# Patient Record
Sex: Female | Born: 1970 | Race: White | Hispanic: No | Smoking: Current every day smoker
Health system: Southern US, Community
[De-identification: ages and names within clinical notes are randomized; demographics above are authoritative.]

## PROBLEM LIST (undated history)

## (undated) DIAGNOSIS — M549 Dorsalgia, unspecified: Secondary | ICD-10-CM

## (undated) DIAGNOSIS — F419 Anxiety disorder, unspecified: Secondary | ICD-10-CM

## (undated) DIAGNOSIS — K259 Gastric ulcer, unspecified as acute or chronic, without hemorrhage or perforation: Secondary | ICD-10-CM

## (undated) DIAGNOSIS — K219 Gastro-esophageal reflux disease without esophagitis: Secondary | ICD-10-CM

## (undated) DIAGNOSIS — N3010 Interstitial cystitis (chronic) without hematuria: Secondary | ICD-10-CM

## (undated) DIAGNOSIS — J449 Chronic obstructive pulmonary disease, unspecified: Secondary | ICD-10-CM

## (undated) DIAGNOSIS — N289 Disorder of kidney and ureter, unspecified: Secondary | ICD-10-CM

## (undated) DIAGNOSIS — J189 Pneumonia, unspecified organism: Secondary | ICD-10-CM

## (undated) DIAGNOSIS — F319 Bipolar disorder, unspecified: Secondary | ICD-10-CM

## (undated) DIAGNOSIS — G8929 Other chronic pain: Secondary | ICD-10-CM

## (undated) DIAGNOSIS — K2210 Ulcer of esophagus without bleeding: Secondary | ICD-10-CM

## (undated) DIAGNOSIS — E0500 Thyrotoxicosis with diffuse goiter without thyrotoxic crisis or storm: Secondary | ICD-10-CM

## (undated) DIAGNOSIS — J45909 Unspecified asthma, uncomplicated: Secondary | ICD-10-CM

## (undated) DIAGNOSIS — IMO0002 Reserved for concepts with insufficient information to code with codable children: Secondary | ICD-10-CM

## (undated) DIAGNOSIS — F32A Depression, unspecified: Secondary | ICD-10-CM

## (undated) DIAGNOSIS — E039 Hypothyroidism, unspecified: Secondary | ICD-10-CM

## (undated) DIAGNOSIS — M545 Low back pain, unspecified: Secondary | ICD-10-CM

## (undated) DIAGNOSIS — F329 Major depressive disorder, single episode, unspecified: Secondary | ICD-10-CM

## (undated) HISTORY — PX: THYROIDECTOMY: SHX17

## (undated) HISTORY — PX: FRACTURE SURGERY: SHX138

## (undated) HISTORY — PX: ABDOMINAL HYSTERECTOMY: SHX81

## (undated) HISTORY — PX: KNEE ARTHROSCOPY: SHX127

## (undated) HISTORY — PX: EYE SURGERY: SHX253

## (undated) HISTORY — PX: TOTAL ABDOMINAL HYSTERECTOMY: SHX209

## (undated) HISTORY — PX: TUBAL LIGATION: SHX77

## (undated) HISTORY — PX: KNEE ARTHROCENTESIS: SUR44

## (undated) NOTE — *Deleted (*Deleted)
Thank you for coming to see me today. It was a pleasure  Please follow-up with PCP as needed  If you have any questions or concerns, please do not hesitate to call the office at [phone].  Best,   Tanya Walsh, MD Family Medicine Residency   Outpatient Mental Health Providers (No Insurance required or Self Pay)  [facility]  [address] [place], [place] [phone] Crisis [phone]  [facility] ([place]) can see uninsured folks for outpatient therapy [email or web address]-[email or web address] [address] [place], [place] [address] [phone]  [facility]    Walk-in Mon-Fri, 8am-3pm [email or web address] [address], [place], [place]  [phone]   [address]  [place] [phone] [facility] [place] [facility] for psych med management, there may be a wait- if [facility] is working with clients for OPT, they will coordinate with [facility] for psych  [facility]   Walk-in-Clinic: Monday- Friday 9:00 AM - 4:00 PM [address]   , [place] [phone]  [facility] ([place]) walk in M-F 8am-12pm and  1pm-3pm [place]- [address]     [phone]  [place] -[address]  Phone: [phone]  [facility] (Mental Health and substance challenges) [address]   [place] [phone]    [email or web address]    [facility]  [place] -[address]     Phone:  [phone] [place]-  [address]  [phone]   [facility]  [address] [place]  [phone] [email or web address]   [facility] ( virtual or zoom therapy) [email or web address]  [address] [place]  [phone]    [facility] [phone]  grief counseling, dementia and caregiver support    [facility] Walk-in MWF 12:30 to 3:00      [address]  [place] [address]  [phone]  www.[email or web address] call to schedule an appointment    [facility]  Wellness Classes ,Support group, Peer support services, [address], [place], [place] [address] [phone]  www.mhag.org           [facility] (NAMI) [place]- Wellness classes, Support groups        [address], [place], [place] [address] [phone]   https://www.nami.org/   [facility]  (Psycho-social Rehabilitation clubhouse, Individual and group therapy) [address] [place], [place] [address]   [phone]  24- Hour Availability:  *[facility] [phone] or 1-800-711-2635 * [facility] Crisis (Domestic Violence, Rape, etc. )[phone] * [facility] 1-855-788-8787 or [phone] * [facility] [place] [facility] [phone](8am-4pm only) [phone] (after hours) *[facility] Mobile Crisis Unit 1-877-626-1772 *[place] National Suicide Hotline 1-800-273-8255 (TALK)    

---

## 1998-09-22 ENCOUNTER — Emergency Department (HOSPITAL_COMMUNITY): Admission: EM | Admit: 1998-09-22 | Discharge: 1998-09-22 | Payer: Self-pay | Admitting: Emergency Medicine

## 1998-09-22 ENCOUNTER — Encounter: Payer: Self-pay | Admitting: *Deleted

## 1998-09-26 ENCOUNTER — Emergency Department (HOSPITAL_COMMUNITY): Admission: EM | Admit: 1998-09-26 | Discharge: 1998-09-26 | Payer: Self-pay | Admitting: Emergency Medicine

## 1999-01-03 ENCOUNTER — Encounter: Admission: RE | Admit: 1999-01-03 | Discharge: 1999-01-03 | Payer: Self-pay | Admitting: Obstetrics & Gynecology

## 1999-01-04 ENCOUNTER — Encounter: Payer: Self-pay | Admitting: Family Medicine

## 1999-01-04 ENCOUNTER — Ambulatory Visit (HOSPITAL_COMMUNITY): Admission: RE | Admit: 1999-01-04 | Discharge: 1999-01-04 | Payer: Self-pay | Admitting: Family Medicine

## 1999-07-17 ENCOUNTER — Emergency Department (HOSPITAL_COMMUNITY): Admission: EM | Admit: 1999-07-17 | Discharge: 1999-07-17 | Payer: Self-pay | Admitting: Emergency Medicine

## 1999-07-17 ENCOUNTER — Encounter: Payer: Self-pay | Admitting: Emergency Medicine

## 1999-11-17 ENCOUNTER — Ambulatory Visit (HOSPITAL_COMMUNITY): Admission: RE | Admit: 1999-11-17 | Discharge: 1999-11-17 | Payer: Self-pay | Admitting: *Deleted

## 1999-12-25 ENCOUNTER — Observation Stay (HOSPITAL_COMMUNITY): Admission: AD | Admit: 1999-12-25 | Discharge: 1999-12-26 | Payer: Self-pay | Admitting: Obstetrics

## 1999-12-28 ENCOUNTER — Encounter: Admission: RE | Admit: 1999-12-28 | Discharge: 1999-12-28 | Payer: Self-pay | Admitting: Obstetrics

## 2000-01-01 ENCOUNTER — Ambulatory Visit (HOSPITAL_COMMUNITY): Admission: RE | Admit: 2000-01-01 | Discharge: 2000-01-01 | Payer: Self-pay | Admitting: *Deleted

## 2000-01-17 ENCOUNTER — Encounter: Admission: RE | Admit: 2000-01-17 | Discharge: 2000-01-17 | Payer: Self-pay | Admitting: Obstetrics & Gynecology

## 2000-01-31 ENCOUNTER — Other Ambulatory Visit: Admission: RE | Admit: 2000-01-31 | Discharge: 2000-01-31 | Payer: Self-pay | Admitting: Obstetrics & Gynecology

## 2000-01-31 ENCOUNTER — Encounter: Admission: RE | Admit: 2000-01-31 | Discharge: 2000-01-31 | Payer: Self-pay | Admitting: Obstetrics & Gynecology

## 2000-02-13 ENCOUNTER — Ambulatory Visit (HOSPITAL_COMMUNITY): Admission: RE | Admit: 2000-02-13 | Discharge: 2000-02-13 | Payer: Self-pay | Admitting: Obstetrics & Gynecology

## 2000-02-14 ENCOUNTER — Encounter: Admission: RE | Admit: 2000-02-14 | Discharge: 2000-02-14 | Payer: Self-pay | Admitting: Obstetrics & Gynecology

## 2000-03-12 ENCOUNTER — Ambulatory Visit (HOSPITAL_COMMUNITY): Admission: RE | Admit: 2000-03-12 | Discharge: 2000-03-12 | Payer: Self-pay

## 2000-03-27 ENCOUNTER — Encounter: Admission: RE | Admit: 2000-03-27 | Discharge: 2000-03-27 | Payer: Self-pay | Admitting: Obstetrics & Gynecology

## 2000-03-27 ENCOUNTER — Encounter (HOSPITAL_COMMUNITY): Admission: RE | Admit: 2000-03-27 | Discharge: 2000-04-08 | Payer: Self-pay | Admitting: Obstetrics & Gynecology

## 2000-04-03 ENCOUNTER — Encounter: Admission: RE | Admit: 2000-04-03 | Discharge: 2000-04-03 | Payer: Self-pay | Admitting: Obstetrics & Gynecology

## 2000-04-06 ENCOUNTER — Inpatient Hospital Stay (HOSPITAL_COMMUNITY): Admission: AD | Admit: 2000-04-06 | Discharge: 2000-04-08 | Payer: Self-pay | Admitting: *Deleted

## 2000-04-06 ENCOUNTER — Inpatient Hospital Stay (HOSPITAL_COMMUNITY): Admission: AD | Admit: 2000-04-06 | Discharge: 2000-04-06 | Payer: Self-pay | Admitting: *Deleted

## 2000-10-04 ENCOUNTER — Emergency Department (HOSPITAL_COMMUNITY): Admission: EM | Admit: 2000-10-04 | Discharge: 2000-10-04 | Payer: Self-pay | Admitting: Emergency Medicine

## 2000-12-06 ENCOUNTER — Encounter: Admission: RE | Admit: 2000-12-06 | Discharge: 2000-12-06 | Payer: Self-pay | Admitting: Family Medicine

## 2001-01-17 ENCOUNTER — Encounter: Admission: RE | Admit: 2001-01-17 | Discharge: 2001-01-17 | Payer: Self-pay | Admitting: Family Medicine

## 2001-01-24 ENCOUNTER — Encounter: Admission: RE | Admit: 2001-01-24 | Discharge: 2001-01-24 | Payer: Self-pay | Admitting: Family Medicine

## 2001-04-22 ENCOUNTER — Encounter: Admission: RE | Admit: 2001-04-22 | Discharge: 2001-04-22 | Payer: Self-pay | Admitting: Family Medicine

## 2001-04-23 ENCOUNTER — Encounter: Admission: RE | Admit: 2001-04-23 | Discharge: 2001-04-23 | Payer: Self-pay | Admitting: Family Medicine

## 2001-04-23 ENCOUNTER — Encounter: Payer: Self-pay | Admitting: Family Medicine

## 2001-06-13 ENCOUNTER — Encounter: Admission: RE | Admit: 2001-06-13 | Discharge: 2001-06-13 | Payer: Self-pay | Admitting: Family Medicine

## 2001-06-27 ENCOUNTER — Encounter: Admission: RE | Admit: 2001-06-27 | Discharge: 2001-06-27 | Payer: Self-pay | Admitting: Sports Medicine

## 2001-06-27 ENCOUNTER — Encounter: Payer: Self-pay | Admitting: Sports Medicine

## 2001-07-15 ENCOUNTER — Encounter: Admission: RE | Admit: 2001-07-15 | Discharge: 2001-07-15 | Payer: Self-pay | Admitting: Family Medicine

## 2001-07-22 ENCOUNTER — Encounter: Admission: RE | Admit: 2001-07-22 | Discharge: 2001-07-22 | Payer: Self-pay | Admitting: Family Medicine

## 2001-07-29 ENCOUNTER — Encounter: Payer: Self-pay | Admitting: Family Medicine

## 2001-07-29 ENCOUNTER — Ambulatory Visit (HOSPITAL_COMMUNITY): Admission: RE | Admit: 2001-07-29 | Discharge: 2001-07-29 | Payer: Self-pay | Admitting: Family Medicine

## 2001-08-01 ENCOUNTER — Encounter: Admission: RE | Admit: 2001-08-01 | Discharge: 2001-08-01 | Payer: Self-pay | Admitting: Family Medicine

## 2001-08-05 ENCOUNTER — Ambulatory Visit (HOSPITAL_COMMUNITY): Admission: RE | Admit: 2001-08-05 | Discharge: 2001-08-05 | Payer: Self-pay | Admitting: Family Medicine

## 2001-08-05 ENCOUNTER — Encounter: Payer: Self-pay | Admitting: Family Medicine

## 2001-08-26 ENCOUNTER — Encounter: Payer: Self-pay | Admitting: Family Medicine

## 2001-08-26 ENCOUNTER — Ambulatory Visit (HOSPITAL_COMMUNITY): Admission: RE | Admit: 2001-08-26 | Discharge: 2001-08-26 | Payer: Self-pay | Admitting: Family Medicine

## 2001-09-30 ENCOUNTER — Encounter: Admission: RE | Admit: 2001-09-30 | Discharge: 2001-09-30 | Payer: Self-pay | Admitting: Family Medicine

## 2001-11-18 ENCOUNTER — Encounter: Admission: RE | Admit: 2001-11-18 | Discharge: 2001-11-18 | Payer: Self-pay | Admitting: Family Medicine

## 2001-12-19 ENCOUNTER — Encounter: Admission: RE | Admit: 2001-12-19 | Discharge: 2001-12-19 | Payer: Self-pay | Admitting: Family Medicine

## 2001-12-22 ENCOUNTER — Encounter: Admission: RE | Admit: 2001-12-22 | Discharge: 2001-12-22 | Payer: Self-pay | Admitting: Family Medicine

## 2002-01-19 ENCOUNTER — Encounter: Admission: RE | Admit: 2002-01-19 | Discharge: 2002-01-19 | Payer: Self-pay | Admitting: Family Medicine

## 2002-02-25 ENCOUNTER — Encounter: Admission: RE | Admit: 2002-02-25 | Discharge: 2002-02-25 | Payer: Self-pay | Admitting: Family Medicine

## 2002-11-24 ENCOUNTER — Encounter: Admission: RE | Admit: 2002-11-24 | Discharge: 2002-11-24 | Payer: Self-pay | Admitting: Family Medicine

## 2002-12-01 ENCOUNTER — Encounter: Admission: RE | Admit: 2002-12-01 | Discharge: 2002-12-01 | Payer: Self-pay | Admitting: Family Medicine

## 2003-01-27 ENCOUNTER — Inpatient Hospital Stay (HOSPITAL_COMMUNITY): Admission: EM | Admit: 2003-01-27 | Discharge: 2003-02-01 | Payer: Self-pay | Admitting: *Deleted

## 2003-02-08 ENCOUNTER — Inpatient Hospital Stay (HOSPITAL_COMMUNITY): Admission: EM | Admit: 2003-02-08 | Discharge: 2003-02-12 | Payer: Self-pay | Admitting: Psychiatry

## 2003-02-08 ENCOUNTER — Encounter: Admission: RE | Admit: 2003-02-08 | Discharge: 2003-02-08 | Payer: Self-pay | Admitting: Family Medicine

## 2003-02-16 ENCOUNTER — Inpatient Hospital Stay (HOSPITAL_COMMUNITY): Admission: EM | Admit: 2003-02-16 | Discharge: 2003-02-20 | Payer: Self-pay | Admitting: Psychiatry

## 2003-03-15 ENCOUNTER — Encounter: Admission: RE | Admit: 2003-03-15 | Discharge: 2003-03-15 | Payer: Self-pay | Admitting: Family Medicine

## 2003-03-17 ENCOUNTER — Encounter: Payer: Self-pay | Admitting: Sports Medicine

## 2003-03-17 ENCOUNTER — Encounter: Admission: RE | Admit: 2003-03-17 | Discharge: 2003-03-17 | Payer: Self-pay | Admitting: Sports Medicine

## 2003-04-13 ENCOUNTER — Encounter: Admission: RE | Admit: 2003-04-13 | Discharge: 2003-04-13 | Payer: Self-pay | Admitting: Family Medicine

## 2003-04-13 ENCOUNTER — Other Ambulatory Visit: Admission: RE | Admit: 2003-04-13 | Discharge: 2003-04-13 | Payer: Self-pay | Admitting: Family Medicine

## 2003-04-13 ENCOUNTER — Encounter (INDEPENDENT_AMBULATORY_CARE_PROVIDER_SITE_OTHER): Payer: Self-pay | Admitting: Specialist

## 2003-04-20 ENCOUNTER — Encounter: Admission: RE | Admit: 2003-04-20 | Discharge: 2003-04-20 | Payer: Self-pay | Admitting: Family Medicine

## 2003-05-04 ENCOUNTER — Encounter: Admission: RE | Admit: 2003-05-04 | Discharge: 2003-05-04 | Payer: Self-pay | Admitting: Family Medicine

## 2003-05-07 ENCOUNTER — Encounter: Admission: RE | Admit: 2003-05-07 | Discharge: 2003-05-07 | Payer: Self-pay | Admitting: Family Medicine

## 2003-05-14 ENCOUNTER — Encounter: Admission: RE | Admit: 2003-05-14 | Discharge: 2003-05-14 | Payer: Self-pay | Admitting: Sports Medicine

## 2003-05-14 ENCOUNTER — Encounter: Payer: Self-pay | Admitting: Sports Medicine

## 2003-07-13 ENCOUNTER — Encounter: Admission: RE | Admit: 2003-07-13 | Discharge: 2003-07-13 | Payer: Self-pay | Admitting: Family Medicine

## 2003-08-03 ENCOUNTER — Encounter: Admission: RE | Admit: 2003-08-03 | Discharge: 2003-08-03 | Payer: Self-pay | Admitting: Obstetrics and Gynecology

## 2003-08-10 ENCOUNTER — Encounter: Admission: RE | Admit: 2003-08-10 | Discharge: 2003-08-10 | Payer: Self-pay | Admitting: Family Medicine

## 2003-09-06 ENCOUNTER — Ambulatory Visit (HOSPITAL_COMMUNITY): Admission: RE | Admit: 2003-09-06 | Discharge: 2003-09-06 | Payer: Self-pay | Admitting: Obstetrics & Gynecology

## 2003-09-15 ENCOUNTER — Inpatient Hospital Stay (HOSPITAL_COMMUNITY): Admission: AD | Admit: 2003-09-15 | Discharge: 2003-09-15 | Payer: Self-pay | Admitting: Obstetrics and Gynecology

## 2003-10-19 ENCOUNTER — Encounter: Admission: RE | Admit: 2003-10-19 | Discharge: 2003-10-19 | Payer: Self-pay | Admitting: Obstetrics & Gynecology

## 2003-10-21 ENCOUNTER — Emergency Department (HOSPITAL_COMMUNITY): Admission: EM | Admit: 2003-10-21 | Discharge: 2003-10-21 | Payer: Self-pay | Admitting: Emergency Medicine

## 2003-11-19 ENCOUNTER — Encounter: Admission: RE | Admit: 2003-11-19 | Discharge: 2003-11-19 | Payer: Self-pay | Admitting: Family Medicine

## 2004-01-03 ENCOUNTER — Encounter: Admission: RE | Admit: 2004-01-03 | Discharge: 2004-01-03 | Payer: Self-pay | Admitting: Family Medicine

## 2004-01-15 ENCOUNTER — Encounter (INDEPENDENT_AMBULATORY_CARE_PROVIDER_SITE_OTHER): Payer: Self-pay | Admitting: *Deleted

## 2004-01-15 LAB — CONVERTED CEMR LAB

## 2004-01-17 ENCOUNTER — Encounter: Admission: RE | Admit: 2004-01-17 | Discharge: 2004-01-17 | Payer: Self-pay | Admitting: Family Medicine

## 2004-02-02 ENCOUNTER — Encounter: Admission: RE | Admit: 2004-02-02 | Discharge: 2004-02-02 | Payer: Self-pay | Admitting: Family Medicine

## 2004-02-15 ENCOUNTER — Encounter: Admission: RE | Admit: 2004-02-15 | Discharge: 2004-02-15 | Payer: Self-pay | Admitting: Family Medicine

## 2004-02-29 ENCOUNTER — Encounter: Admission: RE | Admit: 2004-02-29 | Discharge: 2004-02-29 | Payer: Self-pay | Admitting: Family Medicine

## 2004-03-28 ENCOUNTER — Encounter: Admission: RE | Admit: 2004-03-28 | Discharge: 2004-03-28 | Payer: Self-pay | Admitting: Family Medicine

## 2004-05-02 ENCOUNTER — Encounter: Admission: RE | Admit: 2004-05-02 | Discharge: 2004-05-02 | Payer: Self-pay | Admitting: Family Medicine

## 2004-05-15 ENCOUNTER — Emergency Department (HOSPITAL_COMMUNITY): Admission: EM | Admit: 2004-05-15 | Discharge: 2004-05-15 | Payer: Self-pay | Admitting: Family Medicine

## 2004-05-17 ENCOUNTER — Encounter: Admission: RE | Admit: 2004-05-17 | Discharge: 2004-05-17 | Payer: Self-pay | Admitting: Family Medicine

## 2004-06-28 ENCOUNTER — Encounter: Admission: RE | Admit: 2004-06-28 | Discharge: 2004-06-28 | Payer: Self-pay | Admitting: Family Medicine

## 2004-07-11 ENCOUNTER — Encounter: Admission: RE | Admit: 2004-07-11 | Discharge: 2004-07-11 | Payer: Self-pay | Admitting: Family Medicine

## 2004-10-10 ENCOUNTER — Ambulatory Visit: Payer: Self-pay | Admitting: Family Medicine

## 2004-11-14 ENCOUNTER — Ambulatory Visit: Payer: Self-pay | Admitting: Family Medicine

## 2004-11-28 ENCOUNTER — Ambulatory Visit: Payer: Self-pay | Admitting: Obstetrics & Gynecology

## 2004-12-06 ENCOUNTER — Ambulatory Visit (HOSPITAL_BASED_OUTPATIENT_CLINIC_OR_DEPARTMENT_OTHER): Admission: RE | Admit: 2004-12-06 | Discharge: 2004-12-06 | Payer: Self-pay | Admitting: Urology

## 2004-12-06 ENCOUNTER — Encounter (INDEPENDENT_AMBULATORY_CARE_PROVIDER_SITE_OTHER): Payer: Self-pay | Admitting: *Deleted

## 2004-12-06 ENCOUNTER — Ambulatory Visit (HOSPITAL_COMMUNITY): Admission: RE | Admit: 2004-12-06 | Discharge: 2004-12-06 | Payer: Self-pay | Admitting: Urology

## 2005-03-08 ENCOUNTER — Emergency Department (HOSPITAL_COMMUNITY): Admission: EM | Admit: 2005-03-08 | Discharge: 2005-03-08 | Payer: Self-pay | Admitting: Internal Medicine

## 2005-04-03 ENCOUNTER — Ambulatory Visit: Payer: Self-pay | Admitting: Family Medicine

## 2005-05-01 ENCOUNTER — Ambulatory Visit: Payer: Self-pay | Admitting: Family Medicine

## 2005-08-06 ENCOUNTER — Ambulatory Visit: Payer: Self-pay | Admitting: Family Medicine

## 2005-11-01 ENCOUNTER — Ambulatory Visit: Payer: Self-pay | Admitting: Family Medicine

## 2005-12-25 ENCOUNTER — Ambulatory Visit: Payer: Self-pay | Admitting: Sports Medicine

## 2006-03-21 ENCOUNTER — Ambulatory Visit: Payer: Self-pay | Admitting: Family Medicine

## 2006-06-13 ENCOUNTER — Ambulatory Visit: Payer: Self-pay | Admitting: Family Medicine

## 2006-09-23 ENCOUNTER — Ambulatory Visit: Payer: Self-pay | Admitting: Family Medicine

## 2006-12-23 ENCOUNTER — Ambulatory Visit: Payer: Self-pay | Admitting: Family Medicine

## 2007-01-09 DIAGNOSIS — E0500 Thyrotoxicosis with diffuse goiter without thyrotoxic crisis or storm: Secondary | ICD-10-CM | POA: Insufficient documentation

## 2007-01-09 DIAGNOSIS — F17200 Nicotine dependence, unspecified, uncomplicated: Secondary | ICD-10-CM | POA: Insufficient documentation

## 2007-01-09 DIAGNOSIS — F319 Bipolar disorder, unspecified: Secondary | ICD-10-CM

## 2007-01-09 DIAGNOSIS — N3010 Interstitial cystitis (chronic) without hematuria: Secondary | ICD-10-CM | POA: Insufficient documentation

## 2007-01-09 DIAGNOSIS — E039 Hypothyroidism, unspecified: Secondary | ICD-10-CM | POA: Insufficient documentation

## 2007-01-09 DIAGNOSIS — F17210 Nicotine dependence, cigarettes, uncomplicated: Secondary | ICD-10-CM | POA: Insufficient documentation

## 2007-01-09 DIAGNOSIS — F411 Generalized anxiety disorder: Secondary | ICD-10-CM | POA: Insufficient documentation

## 2007-01-10 ENCOUNTER — Encounter (INDEPENDENT_AMBULATORY_CARE_PROVIDER_SITE_OTHER): Payer: Self-pay | Admitting: *Deleted

## 2007-01-30 ENCOUNTER — Ambulatory Visit: Payer: Self-pay | Admitting: Gynecology

## 2007-01-30 ENCOUNTER — Encounter (INDEPENDENT_AMBULATORY_CARE_PROVIDER_SITE_OTHER): Payer: Self-pay | Admitting: Gynecology

## 2007-03-06 ENCOUNTER — Inpatient Hospital Stay (HOSPITAL_COMMUNITY): Admission: RE | Admit: 2007-03-06 | Discharge: 2007-03-08 | Payer: Self-pay | Admitting: Gynecology

## 2007-03-06 ENCOUNTER — Encounter (INDEPENDENT_AMBULATORY_CARE_PROVIDER_SITE_OTHER): Payer: Self-pay | Admitting: Specialist

## 2007-03-06 ENCOUNTER — Ambulatory Visit: Payer: Self-pay | Admitting: Gynecology

## 2007-04-23 ENCOUNTER — Telehealth: Payer: Self-pay | Admitting: Family Medicine

## 2007-05-23 ENCOUNTER — Telehealth: Payer: Self-pay | Admitting: Family Medicine

## 2007-06-20 ENCOUNTER — Ambulatory Visit: Payer: Self-pay | Admitting: Family Medicine

## 2007-06-20 ENCOUNTER — Encounter: Payer: Self-pay | Admitting: Family Medicine

## 2007-06-23 ENCOUNTER — Telehealth: Payer: Self-pay | Admitting: Family Medicine

## 2007-06-23 LAB — CONVERTED CEMR LAB: TSH: 0.252 microintl units/mL — ABNORMAL LOW (ref 0.350–5.50)

## 2007-07-21 ENCOUNTER — Telehealth: Payer: Self-pay | Admitting: Family Medicine

## 2007-08-15 ENCOUNTER — Telehealth: Payer: Self-pay | Admitting: Family Medicine

## 2007-09-01 ENCOUNTER — Telehealth: Payer: Self-pay | Admitting: Family Medicine

## 2007-09-19 ENCOUNTER — Telehealth (INDEPENDENT_AMBULATORY_CARE_PROVIDER_SITE_OTHER): Payer: Self-pay | Admitting: Family Medicine

## 2007-10-13 ENCOUNTER — Ambulatory Visit: Payer: Self-pay | Admitting: Sports Medicine

## 2007-10-13 DIAGNOSIS — Z780 Asymptomatic menopausal state: Secondary | ICD-10-CM | POA: Insufficient documentation

## 2007-10-20 ENCOUNTER — Telehealth: Payer: Self-pay | Admitting: Family Medicine

## 2007-11-11 ENCOUNTER — Telehealth (INDEPENDENT_AMBULATORY_CARE_PROVIDER_SITE_OTHER): Payer: Self-pay | Admitting: Family Medicine

## 2007-12-12 ENCOUNTER — Telehealth: Payer: Self-pay | Admitting: Family Medicine

## 2008-01-14 ENCOUNTER — Ambulatory Visit: Payer: Self-pay | Admitting: Family Medicine

## 2008-01-14 DIAGNOSIS — J449 Chronic obstructive pulmonary disease, unspecified: Secondary | ICD-10-CM

## 2008-01-15 ENCOUNTER — Ambulatory Visit: Payer: Self-pay | Admitting: Family Medicine

## 2008-01-26 ENCOUNTER — Telehealth: Payer: Self-pay | Admitting: *Deleted

## 2008-01-27 ENCOUNTER — Emergency Department (HOSPITAL_COMMUNITY): Admission: EM | Admit: 2008-01-27 | Discharge: 2008-01-27 | Payer: Self-pay | Admitting: Emergency Medicine

## 2008-02-11 ENCOUNTER — Telehealth: Payer: Self-pay | Admitting: Family Medicine

## 2008-03-12 ENCOUNTER — Telehealth: Payer: Self-pay | Admitting: Family Medicine

## 2008-03-18 ENCOUNTER — Encounter: Payer: Self-pay | Admitting: Family Medicine

## 2008-03-23 ENCOUNTER — Ambulatory Visit: Payer: Self-pay | Admitting: Family Medicine

## 2008-04-02 ENCOUNTER — Encounter: Payer: Self-pay | Admitting: Family Medicine

## 2008-04-09 ENCOUNTER — Telehealth: Payer: Self-pay | Admitting: Family Medicine

## 2008-05-12 ENCOUNTER — Telehealth: Payer: Self-pay | Admitting: Family Medicine

## 2008-05-17 ENCOUNTER — Ambulatory Visit: Payer: Self-pay | Admitting: Family Medicine

## 2008-05-17 ENCOUNTER — Telehealth: Payer: Self-pay | Admitting: *Deleted

## 2008-06-07 ENCOUNTER — Encounter: Payer: Self-pay | Admitting: Family Medicine

## 2008-06-11 ENCOUNTER — Telehealth: Payer: Self-pay | Admitting: Family Medicine

## 2008-07-12 ENCOUNTER — Encounter: Payer: Self-pay | Admitting: Family Medicine

## 2008-07-13 ENCOUNTER — Telehealth: Payer: Self-pay | Admitting: Family Medicine

## 2008-08-12 ENCOUNTER — Telehealth: Payer: Self-pay | Admitting: *Deleted

## 2008-09-10 ENCOUNTER — Telehealth: Payer: Self-pay | Admitting: Family Medicine

## 2008-10-12 ENCOUNTER — Telehealth: Payer: Self-pay | Admitting: Family Medicine

## 2008-11-15 ENCOUNTER — Telehealth: Payer: Self-pay | Admitting: Family Medicine

## 2008-12-10 ENCOUNTER — Encounter: Payer: Self-pay | Admitting: Family Medicine

## 2008-12-10 ENCOUNTER — Ambulatory Visit: Payer: Self-pay | Admitting: Family Medicine

## 2008-12-10 LAB — CONVERTED CEMR LAB
ALT: 9 units/L (ref 0–35)
AST: 12 units/L (ref 0–37)
Albumin: 4.5 g/dL (ref 3.5–5.2)
Alkaline Phosphatase: 69 units/L (ref 39–117)
BUN: 10 mg/dL (ref 6–23)
Basophils Absolute: 0.1 10*3/uL (ref 0.0–0.1)
Basophils Relative: 1 % (ref 0–1)
CO2: 24 meq/L (ref 19–32)
Calcium: 9.6 mg/dL (ref 8.4–10.5)
Chloride: 106 meq/L (ref 96–112)
Cholesterol: 165 mg/dL (ref 0–200)
Creatinine, Ser: 0.86 mg/dL (ref 0.40–1.20)
Eosinophils Absolute: 0.1 10*3/uL (ref 0.0–0.7)
Eosinophils Relative: 2 % (ref 0–5)
Glucose, Bld: 92 mg/dL (ref 70–99)
HCT: 43.2 % (ref 36.0–46.0)
HDL: 53 mg/dL (ref >39)
Hemoglobin: 14.3 g/dL (ref 12.0–15.0)
LDL Cholesterol: 91 mg/dL (ref 0–99)
Lymphocytes Relative: 44 % (ref 12–46)
Lymphs Abs: 2 10*3/uL (ref 0.7–4.0)
MCHC: 33.1 g/dL (ref 30.0–36.0)
MCV: 90.2 fL (ref 78.0–100.0)
Monocytes Absolute: 0.4 10*3/uL (ref 0.1–1.0)
Monocytes Relative: 8 % (ref 3–12)
Neutro Abs: 2.1 10*3/uL (ref 1.7–7.7)
Neutrophils Relative %: 46 % (ref 43–77)
Platelets: 277 10*3/uL (ref 150–400)
Potassium: 4.3 meq/L (ref 3.5–5.3)
RBC: 4.79 M/uL (ref 3.87–5.11)
RDW: 12.5 % (ref 11.5–15.5)
Sodium: 140 meq/L (ref 135–145)
TSH: 0.034 microintl units/mL — ABNORMAL LOW (ref 0.350–4.50)
Total Bilirubin: 0.4 mg/dL (ref 0.3–1.2)
Total CHOL/HDL Ratio: 3.1
Total Protein: 7 g/dL (ref 6.0–8.3)
Triglycerides: 105 mg/dL (ref <150)
VLDL: 21 mg/dL (ref 0–40)
Vit D, 1,25-Dihydroxy: 26 — ABNORMAL LOW (ref 30–89)
WBC: 4.6 10*3/uL (ref 4.0–10.5)

## 2008-12-13 ENCOUNTER — Encounter: Payer: Self-pay | Admitting: Family Medicine

## 2008-12-21 ENCOUNTER — Telehealth: Payer: Self-pay | Admitting: Family Medicine

## 2009-01-10 ENCOUNTER — Telehealth: Payer: Self-pay | Admitting: Family Medicine

## 2009-01-11 ENCOUNTER — Ambulatory Visit: Payer: Self-pay | Admitting: Family Medicine

## 2009-01-13 ENCOUNTER — Telehealth: Payer: Self-pay | Admitting: Family Medicine

## 2009-01-13 ENCOUNTER — Encounter: Admission: RE | Admit: 2009-01-13 | Discharge: 2009-01-13 | Payer: Self-pay | Admitting: Family Medicine

## 2009-01-13 ENCOUNTER — Encounter: Payer: Self-pay | Admitting: Family Medicine

## 2009-02-10 ENCOUNTER — Telehealth: Payer: Self-pay | Admitting: Family Medicine

## 2009-02-17 ENCOUNTER — Telehealth: Payer: Self-pay | Admitting: Family Medicine

## 2009-02-17 ENCOUNTER — Encounter: Payer: Self-pay | Admitting: Family Medicine

## 2009-03-08 ENCOUNTER — Encounter: Payer: Self-pay | Admitting: Family Medicine

## 2009-04-12 ENCOUNTER — Encounter: Payer: Self-pay | Admitting: Family Medicine

## 2009-05-11 ENCOUNTER — Telehealth: Payer: Self-pay | Admitting: Family Medicine

## 2009-06-06 ENCOUNTER — Encounter: Payer: Self-pay | Admitting: *Deleted

## 2009-06-09 ENCOUNTER — Telehealth: Payer: Self-pay | Admitting: Family Medicine

## 2009-06-10 ENCOUNTER — Telehealth: Payer: Self-pay | Admitting: *Deleted

## 2009-07-12 ENCOUNTER — Encounter: Payer: Self-pay | Admitting: Family Medicine

## 2009-07-13 ENCOUNTER — Telehealth: Payer: Self-pay | Admitting: Family Medicine

## 2009-07-25 ENCOUNTER — Encounter: Payer: Self-pay | Admitting: Family Medicine

## 2009-08-12 ENCOUNTER — Telehealth: Payer: Self-pay | Admitting: Family Medicine

## 2009-09-12 ENCOUNTER — Telehealth: Payer: Self-pay | Admitting: Family Medicine

## 2009-09-26 ENCOUNTER — Ambulatory Visit: Payer: Self-pay | Admitting: Family Medicine

## 2009-09-26 ENCOUNTER — Encounter: Payer: Self-pay | Admitting: Family Medicine

## 2009-10-11 ENCOUNTER — Telehealth: Payer: Self-pay | Admitting: Family Medicine

## 2009-11-09 ENCOUNTER — Telehealth: Payer: Self-pay | Admitting: Family Medicine

## 2009-12-12 ENCOUNTER — Telehealth: Payer: Self-pay | Admitting: Family Medicine

## 2009-12-14 ENCOUNTER — Telehealth: Payer: Self-pay | Admitting: Family Medicine

## 2010-01-09 ENCOUNTER — Telehealth: Payer: Self-pay | Admitting: Family Medicine

## 2010-02-09 ENCOUNTER — Telehealth: Payer: Self-pay | Admitting: Family Medicine

## 2010-03-10 ENCOUNTER — Telehealth: Payer: Self-pay | Admitting: Family Medicine

## 2010-04-11 ENCOUNTER — Telehealth: Payer: Self-pay | Admitting: Family Medicine

## 2010-05-08 ENCOUNTER — Emergency Department (HOSPITAL_COMMUNITY): Admission: EM | Admit: 2010-05-08 | Discharge: 2010-05-08 | Payer: Self-pay | Admitting: Emergency Medicine

## 2010-05-09 ENCOUNTER — Telehealth: Payer: Self-pay | Admitting: Family Medicine

## 2010-05-09 ENCOUNTER — Ambulatory Visit: Payer: Self-pay | Admitting: Family Medicine

## 2010-05-09 ENCOUNTER — Inpatient Hospital Stay (HOSPITAL_COMMUNITY): Admission: AD | Admit: 2010-05-09 | Discharge: 2010-05-11 | Payer: Self-pay | Admitting: Family Medicine

## 2010-05-10 ENCOUNTER — Telehealth: Payer: Self-pay | Admitting: Family Medicine

## 2010-05-10 ENCOUNTER — Encounter: Payer: Self-pay | Admitting: Family Medicine

## 2010-05-23 ENCOUNTER — Encounter: Payer: Self-pay | Admitting: Family Medicine

## 2010-05-23 ENCOUNTER — Ambulatory Visit: Payer: Self-pay | Admitting: Family Medicine

## 2010-05-23 DIAGNOSIS — K279 Peptic ulcer, site unspecified, unspecified as acute or chronic, without hemorrhage or perforation: Secondary | ICD-10-CM | POA: Insufficient documentation

## 2010-05-23 LAB — CONVERTED CEMR LAB: TSH: 0.91 microintl units/mL (ref 0.350–4.500)

## 2010-05-24 ENCOUNTER — Encounter: Payer: Self-pay | Admitting: Family Medicine

## 2010-06-08 ENCOUNTER — Telehealth: Payer: Self-pay | Admitting: Family Medicine

## 2010-06-08 ENCOUNTER — Encounter: Payer: Self-pay | Admitting: Family Medicine

## 2010-07-12 ENCOUNTER — Telehealth: Payer: Self-pay | Admitting: Family Medicine

## 2010-07-31 ENCOUNTER — Encounter: Payer: Self-pay | Admitting: Family Medicine

## 2010-07-31 DIAGNOSIS — J45909 Unspecified asthma, uncomplicated: Secondary | ICD-10-CM

## 2010-08-09 ENCOUNTER — Encounter: Payer: Self-pay | Admitting: Family Medicine

## 2010-08-10 ENCOUNTER — Telehealth: Payer: Self-pay | Admitting: Family Medicine

## 2010-08-21 ENCOUNTER — Emergency Department (HOSPITAL_COMMUNITY): Admission: EM | Admit: 2010-08-21 | Discharge: 2010-08-21 | Payer: Self-pay | Admitting: Family Medicine

## 2010-08-21 ENCOUNTER — Telehealth: Payer: Self-pay | Admitting: Family Medicine

## 2010-09-08 ENCOUNTER — Encounter: Payer: Self-pay | Admitting: Family Medicine

## 2010-09-11 ENCOUNTER — Telehealth: Payer: Self-pay | Admitting: Family Medicine

## 2010-09-21 ENCOUNTER — Encounter: Payer: Self-pay | Admitting: Family Medicine

## 2010-10-10 ENCOUNTER — Ambulatory Visit (HOSPITAL_BASED_OUTPATIENT_CLINIC_OR_DEPARTMENT_OTHER): Admission: RE | Admit: 2010-10-10 | Discharge: 2010-10-10 | Payer: Self-pay | Admitting: Urology

## 2010-10-11 ENCOUNTER — Telehealth: Payer: Self-pay | Admitting: Family Medicine

## 2010-10-18 ENCOUNTER — Ambulatory Visit: Payer: Self-pay | Admitting: Family Medicine

## 2010-10-24 ENCOUNTER — Encounter: Payer: Self-pay | Admitting: Family Medicine

## 2010-10-24 LAB — CONVERTED CEMR LAB
Cholesterol: 187 mg/dL (ref 0–200)
HDL: 44 mg/dL (ref >39)
LDL Cholesterol: 121 mg/dL — ABNORMAL HIGH (ref 0–99)
Total CHOL/HDL Ratio: 4.3
Triglycerides: 109 mg/dL (ref <150)
VLDL: 22 mg/dL (ref 0–40)

## 2010-12-03 ENCOUNTER — Encounter: Payer: Self-pay | Admitting: Family Medicine

## 2010-12-12 ENCOUNTER — Telehealth: Payer: Self-pay | Admitting: Family Medicine

## 2010-12-12 NOTE — Progress Notes (Signed)
  Phone Note Call from Patient   Caller: Patient Summary of Call: Need rx for her Clonzepam.  Pt is going out of town this weekend would like it filled before 10:00 tomorrow.  You can reach her at [phone].  [facility] is her pharmacy. Initial call taken by: Britta Mccreedy mcgregor  Follow-up for Phone Call        called in myself, she can pick up there Follow-up by: Luretha Murphy NP,  July 12, 2010 12:13 PM    Prescriptions: CLONAZEPAM 1 MG TABS (CLONAZEPAM) Take 1 tablet by mouth three times a day  #90 x 0   Entered and Authorized by:   Luretha Murphy NP   Signed by:   Luretha Murphy NP on 07/12/2010   Method used:   Handwritten   RxID:   [id number]

## 2010-12-12 NOTE — Progress Notes (Signed)
Summary: refill  Phone Note Refill Request Call back at Home Phone [phone] Message from:  Patient  Refills Requested: Medication #1:  CLONAZEPAM 1 MG TABS Take 1 tablet by mouth three times a day   Notes: needs by aug 1st Initial call taken by: De Nurse,  June 08, 2010 3:55 PM    Prescriptions: CLONAZEPAM 1 MG TABS (CLONAZEPAM) Take 1 tablet by mouth three times a day  #90 x 0   Entered and Authorized by:   Luretha Murphy NP   Signed by:   Luretha Murphy NP on 06/09/2010   Method used:   Printed then faxed to ...       [facility]* (retail)       [address] [place], [place]  [address]       Ph: [phone] or [phone]       Fax: [phone]   RxID:   [id number]

## 2010-12-12 NOTE — Letter (Signed)
Summary: Generic Letter  [facility]  [address]   [place], [place] [address]   Phone: [phone]  Fax: [phone]    05/24/2010  [facility] [address] [place], [place]  [address]  Dear Ms. Saric,   Your TSH was normal.    Please plan to quit smoking and we will help you.  Dr. Raymondo Band, a pharmacist at our clinic works with people to help them quit.  You can make an appointment with him when you are ready.  I would also like him to test your lung function.  That may help you decide how important this step is in you being healthy.  Please make an appoinment with Dr. Raymondo Band for pulmonary function tests and smoking cessation when you are ready.  I would like to see you in 2 months.    Sincerely,   Luretha Murphy NP  Appended Document: Generic Letter mailed

## 2010-12-12 NOTE — Progress Notes (Signed)
Summary: refill  Phone Note Refill Request Call back at Home Phone [phone] Message from:  Patient  Refills Requested: Medication #1:  CLONAZEPAM 1 MG TABS Take 1 tablet by mouth three times a day Please call when ready  Initial call taken by: De Nurse,  September 11, 2010 4:19 PM    New/Updated Medications: CLONAZEPAM 1 MG TABS (CLONAZEPAM) Take 1 tablet by mouth three times a day (patient needs to be seen for further refills) Prescriptions: CLONAZEPAM 1 MG TABS (CLONAZEPAM) Take 1 tablet by mouth three times a day (patient needs to be seen for further refills)  #90 x 0   Entered and Authorized by:   Luretha Murphy NP   Signed by:   Luretha Murphy NP on 09/11/2010   Method used:   Printed then faxed to ...       [facility]* (retail)       [address] [place], [place]  [address]       Ph: [phone] or [phone]       Fax: [phone]   RxID:   [id number] CLONAZEPAM 1 MG TABS (CLONAZEPAM) Take 1 tablet by mouth three times a day (patient needs to be seen for further refills)  #90 x 0   Entered and Authorized by:   Luretha Murphy NP   Signed by:   Luretha Murphy NP on 09/11/2010   Method used:   Printed then faxed to ...       [facility]* (retail)       [address] [place], [place]  [address]       Ph: [phone] or [phone]       Fax: [phone]   RxID:   [id number]

## 2010-12-12 NOTE — Progress Notes (Signed)
  Phone Note Call from Patient   Caller: Patient Call For: Luretha Murphy NP Summary of Call: Pt need refill on Clonazapam.  Please call [facility].  Let her know when this has been called in.  [phone] Initial call taken by: Britta Mccreedy mcgregor  Follow-up for Phone Call        to pcp Follow-up by: Theresia Lo RN,  February 09, 2010 11:01 AM    New/Updated Medications: CLONAZEPAM 1 MG TABS (CLONAZEPAM) Take 1 tablet by mouth three times a day Prescriptions: CLONAZEPAM 1 MG TABS (CLONAZEPAM) Take 1 tablet by mouth three times a day  #90 x 0   Entered and Authorized by:   Luretha Murphy NP   Signed by:   Luretha Murphy NP on 02/09/2010   Method used:   Historical   RxID:   [id number]  Called in to the pharmacy Luretha Murphy NP  February 09, 2010 1:37 PM

## 2010-12-12 NOTE — Progress Notes (Signed)
Summary: Rx Req  Phone Note Refill Request Call back at Home Phone [phone] Message from:  Patient  Refills Requested: Medication #1:  CLONAZEPAM 1 MG TABS Take 1 tablet by mouth three times a day [facility].  Initial call taken by: Clydell Hakim,  December 12, 2009 9:00 AM  Follow-up for Phone Call        to pcp Follow-up by: Golden Circle RN,  December 12, 2009 9:02 AM    Prescriptions: CLONAZEPAM 1 MG TABS (CLONAZEPAM) Take 1 tablet by mouth three times a day  #90 x 0   Entered and Authorized by:   Luretha Murphy NP   Signed by:   Luretha Murphy NP on 12/12/2009   Method used:   Printed then faxed to ...       [facility]* (retail)       [address] [place], [place]  [address]       Ph: [phone] or [phone]       Fax: [phone]   RxID:   [id number]

## 2010-12-12 NOTE — Consult Note (Signed)
Summary: [facility]   Imported By: Clydell Hakim 06/19/2010 15:39:29  _____________________________________________________________________  External Attachment:    Type:   Image     Comment:   External Document

## 2010-12-12 NOTE — Progress Notes (Signed)
Summary: triage  Phone Note Call from Patient Call back at Home Phone [phone]   Caller: Patient Summary of Call: Pt seen in ed yesterday for hurting in chest and throwing up.  Says she needs to be seen by someone today.  The ed says she might have an ulcer. Initial call taken by: Clydell Hakim,  May 09, 2010 11:18 AM  Follow-up for Phone Call        states she feels just as bad as she felt yesterday when she called an ambulance. will be here at 1:30. aware of wait Follow-up by: Golden Circle RN,  May 09, 2010 11:27 AM

## 2010-12-12 NOTE — Assessment & Plan Note (Signed)
Summary: F/U ED visit-HOSPITAL ADMISSION   Vital Signs:  Patient profile:   40 year old female Weight:      149.1 pounds Temp:     98.3 degrees F oral Pulse rate:   63 / minute BP sitting:   150 / 90  (right arm)  Vitals Entered By: Arlyss Repress CMA, (May 09, 2010 1:57 PM) CC: stomach pain.Melinda Kitchenleft sided. seen at ED yesterday. vomitting, nausea. esophagus is burning. Pain Assessment Patient in pain? yes     Location: stomach Intensity: 10 Onset of pain  x2 days.   Primary Care Provider:  Luretha Murphy NP  CC:  stomach pain.Melinda Kitchenleft sided. seen at ED yesterday. vomitting and nausea. esophagus is burning.[name]  History of Present Illness:   40 y.o. female presents as follow-up from ED visit for abdominal pain. Abdominal pain began Monday approx 3am waking pt from sleep, pain described as crampy sharp pain in epigastrium that radiates to LUQ. Pain assocaited with Nausea and Emesis of yellow green fluid. Denies assoicated fever, change in stools, blood in stools. 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 EKG showed NSR with non specific t wave changes. Patient was treated with IVF, anti-emtics, GI cocktail and pain meds. Per report trial of by mouth was done and pt discharged with gastritis. This AM pt presented to [facility] with severe pain and  recurrent emesis per above, emesis was visualized with blood speckles. Pt was given Phenergan 25mg  IM and Morphine 5mg  IM and sent for admission for IV hydration and work-up.   ROS- pt denied chest pain, SOB, diarrhea, constipation, dysuria, admits to anorexia since symptoms began, denies alcohol or new medication ingestion.   Habits & Providers  Alcohol-Tobacco-Diet     Tobacco Status: current     Tobacco Counseling: to quit use  of tobacco products  Current Medications (verified): 1)  Ventolin Hfa 108 (90 Base) Mcg/act Aers (Albuterol Sulfate) .... 2 Puffs Qid As Needed 2)  Clonazepam 1 Mg Tabs (Clonazepam) .... Take 1 Tablet By Mouth Three Times A Day 3)  Synthroid 150 Mcg Tabs (Levothyroxine Sodium) .... One Daily 4)  Trazodone Hcl 100 Mg Tabs (Trazodone Hcl) .[name].. 1 Tab At Bedtime 5)  Calcium 500/d 500-200 Mg-Unit  Tabs (Calcium Carbonate-Vitamin D) .... Tid 6)  Albuterol Sulfate (2.5 Mg/86ml) 0.083% Nebu (Albuterol Sulfate) .... Via Neb Every 6h As Needed 7)  Vicodin 5-500 Mg Tabs (Hydrocodone-Acetaminophen) .... One Tab Three Times A Day As Needed 8)  Qvar 80 Mcg/act Aers (Beclomethasone Dipropionate) .... Bid  Allergies (verified): No Known Drug Allergies  Past History:  Past Medical History: Last updated: 01/13/2009 bladder treatment-intersitital cystitis-[facility] CT Head-NL  4/04,  History of pos PPD,  hypothyroid as of 11/2001  I131 trearment of Graves Disease on 08/26/01, Psychiatric break-Bipolar disorder 4/04  (associated with cocaine), manic episode 1/10 Osteopenia T score -1.5 of LS spine, and -1.2 femur ,01/2009  Past Surgical History: Last updated: 03/23/2008 BTL - 11/13/2003,  Pelvic U/S 6/02 right cyst - 06/30/2001,  Pelvic U/S 8/02 cyst resolved - 06/30/2001 TAH and BSO4/24/08 Corrective periorbital debulking of tissue from Graves 4/09    Family History: Last updated: 01/14/2008 addiction,  bipolar illness,  tremor    Social History: Last updated: 03/23/2008 common law marriage-Charlie,  2 children;  mother(deceased) and brother drug addicted;  2 PPD;  Uses marijuana; ,  1/2 sister Andy Gauss    Risk Factors: Smoking Status: current (05/09/2010) Packs/Day: 1.0 (09/26/2009)  Physical Exam  General:  Ill appearing, lying on exam table, episodic waves of abdominal pain visualized multiple piercings/tatoos Vital signs noted  Eyes:  vision grossly intact, pupils equal,  pupils round, and pupils reactive to light.   Mouth:  dry tongue, no exudates, no pharyngeal erythema  Lungs:  bilateral expiratory wheeze, normal WOB, no rhonchi no rales, no retractions Heart:  normal rate and regular rhythm.   Abdomen:  Soft, hypoactive BS, TTP epigastrium and LUQ, no rebound, + gaurding, non distended, no masses palpated No CVA tenderness No hepatomegaly  Pulses:  2+ Extremities:  No edema Neurologic:  alert & oriented X3.     Impression & Recommendations:  Problem # 1:  ABDOMINAL PAIN, ACUTE (ICD-789.00) Assessment New  Abdominal pain associated with emesis, negative gallgladder scan. Concern for PUD with blood speckled sputum, ? perforation , though not quite coffee ground emesis. Other differentials pancreatitis, esophogeal disease, less likley colonic involvement. Will obtain CT abd/pelvis, lipase, amylase, UA and routine labs.  IVF rehydration and anti-emetics, if needed NG tube. Morphine as needed pain , High dose PPI Note will check UDS based on history Pt may need GI for EGD to rule out PUD if persistant pain and no resolution of symptoms  Orders: [facility]- Est Level  5 ([address])  Problem # 2:  COPD (ICD-496) Assessment: Unchanged  Continue home meds Her updated medication list for this problem includes:    Ventolin Hfa 108 (90 Base) Mcg/act Aers (Albuterol sulfate) .[name]... 2 puffs qid as needed    Albuterol Sulfate (2.5 Mg/30ml) 0.083% Nebu (Albuterol sulfate) .[name]... Via neb every 6h as needed    Qvar 80 Mcg/act Aers (Beclomethasone dipropionate) ..... Bid  Orders: [facility]- Est Level  5 ([address])  Problem # 3:  HYPOTHYROIDISM, UNSPECIFIED (ICD-244.9) Assessment: Unchanged  Check TSH continue synthroid Her updated medication list for this problem includes:    Synthroid 150 Mcg Tabs (Levothyroxine sodium) ..... One daily  Orders: [facility]- Est Level  5 ([address])  Problem # 4:  BIPOLAR DISORDER (ICD-296.7) Assessment: Unchanged Continue Klonopin, pt not on any  other mood stabilizers, defer to PCP  Problem # 5:  FEN/GI liquids as tolerated IVF  Problem # 6:  Prophylaxis As visualzied blood in emesis- hold heparin and SCD's to be placed  Problem # 7:  Dispo pending clinical work-up  Complete Medication List: 1)  Ventolin Hfa 108 (90 Base) Mcg/act Aers (Albuterol sulfate) .... 2 puffs qid as needed 2)  Clonazepam 1 Mg Tabs (Clonazepam) .... Take 1 tablet by mouth three times a day 3)  Synthroid 150 Mcg Tabs (Levothyroxine sodium) .... One daily 4)  Trazodone Hcl 100 Mg Tabs (Trazodone hcl) .[name].. 1 tab at bedtime 5)  Calcium 500/d 500-200 Mg-unit Tabs (Calcium carbonate-vitamin d) .... Tid 6)  Albuterol Sulfate (2.5 Mg/32ml) 0.083% Nebu (Albuterol sulfate) .... Via neb every 6h as needed 7)  Vicodin 5-500 Mg Tabs (Hydrocodone-acetaminophen) .... One tab three times a day as needed 8)  Qvar 80 Mcg/act Aers (Beclomethasone dipropionate) .... Bid  Other Orders: Promethazine up to 50mg  (J2550) Morphine Sulfate inj 10 mg (N5621)   Medication Administration  Injection # 1:    Medication: Promethazine up to 50mg     Diagnosis: ABDOMINAL PAIN, PERIUMBILIC (ICD-789.05)    Route: IM    Site: RUOQ gluteus    Exp Date: 10/13/2011  Lot #: [id number]    Mfr: [email or web address]    Comments: 25 mg IM given per Dr.Metzger    Patient tolerated injection without complications    Given by: Arlyss Repress CMA, (May 09, 2010 2:44 PM)  Injection # 2:    Medication: Morphine Sulfate inj 10 mg    Diagnosis: ABDOMINAL PAIN, PERIUMBILIC (ICD-789.05)    Route: IM    Site: RUOQ gluteus    Exp Date: 08/22/2011    Lot #: [id number]    Mfr: [email or web address]    Comments:  5mg  IM given per Dr.[place]    Patient tolerated injection without complications    Given by: Arlyss Repress CMA, (May 09, 2010 3:07 PM)  Orders Added: 1)  Promethazine up to 50mg  [J2550] 2)  Morphine Sulfate inj 10 mg [J2270] 3)  [facility]- Est Level  5 [213[address]]    Appended Document: F/U ED visit-HOSPITAL  ADMISSION IV started with # 22 gauge IV catheter in left anticubical area.  500 cc bag of  normal saline hung and started infusing at 3:10 PM 05/09/2010.

## 2010-12-12 NOTE — Progress Notes (Signed)
Summary: refill  Phone Note Refill Request Call back at Home Phone [phone] Message from:  Patient  Refills Requested: Medication #1:  CLONAZEPAM 1 MG TABS Take 1 tablet by mouth three times a day pt needs for Sunday - Saxon is out today  Initial call taken by: Denise Finley,  March 10, 2010 8:37 AM  Follow-up for Phone Call        she is due for a refill tomorrow.  pcp is on vacation. to Dr, Hensel to see if he will write Follow-up by: Sally Caldwell RN,  March 10, 2010 10:22 AM  Additional Follow-up for Phone Call Additional follow up Details #1::        done Additional Follow-up by: William Hensel MD,  March 10, 2010 11:31 AM    Additional Follow-up for Phone Call Additional follow up Details #2::    informed pt Follow-up by: Sally Caldwell RN,  March 10, 2010 11:43 AM  Prescriptions: CLONAZEPAM 1 MG TABS (CLONAZEPAM) Take 1 tablet by mouth three times a day  #90 x 0   Entered and Authorized by:   William Hensel MD   Signed by:   William Hensel MD on 03/10/2010   Method used:   Printed then faxed to ...       [facility] (retail)       [address] [place], [place]  [address]       Ph: [phone] or [phone]       Fax: [phone]   RxID:   [id number]

## 2010-12-12 NOTE — Miscellaneous (Signed)
  Clinical Lists Changes  Problems: Removed problem of ASTHMA, UNSPECIFIED (ICD-493.90) Added new problem of ASTHMA, PERSISTENT, MODERATE (ICD-493.90) 

## 2010-12-12 NOTE — Progress Notes (Signed)
Summary: Rx Req  Phone Note Refill Request Call back at Home Phone [phone] Message from:  Patient  Refills Requested: Medication #1:  CLONAZEPAM 1 MG TABS Take 1 tablet by mouth three times a day [facility].  Initial call taken by: Clydell Hakim,  August 10, 2010 11:43 AM    Prescriptions: CLONAZEPAM 1 MG TABS (CLONAZEPAM) Take 1 tablet by mouth three times a day  #90 x 0   Entered and Authorized by:   Luretha Murphy NP   Signed by:   Luretha Murphy NP on 08/11/2010   Method used:   Printed then faxed to ...       [facility] (retail)       [address] [place], [place]  [address]       Ph: [phone] or [phone]       Fax: [phone]   RxID:   [id number]

## 2010-12-12 NOTE — Progress Notes (Signed)
Summary: refill  Phone Note Refill Request Call back at Home Phone [phone] Message from:  Patient  Refills Requested: Medication #1:  CLONAZEPAM 1 MG TABS Take 1 tablet by mouth three times a day [facility]  Initial call taken by: De Nurse,  May 10, 2010 11:56 AM  Follow-up for Phone Call        Fax form completed and put in file. Patient currently in the hospital Follow-up by: Zachery Dauer MD,  May 11, 2010 4:52 PM    Prescriptions: CLONAZEPAM 1 MG TABS (CLONAZEPAM) Take 1 tablet by mouth three times a day  #90 x 0   Entered and Authorized by:   Zachery Dauer MD   Signed by:   Zachery Dauer MD on 05/11/2010   Method used:   Historical   RxID:   [id number]

## 2010-12-12 NOTE — Progress Notes (Signed)
Summary: triage  Phone Note Call from Patient Call back at Home Phone [phone]   Caller: Patient Summary of Call: Pt has real bad cough and thinks she needs to come in today. Initial call taken by: Clydell Hakim,  August 21, 2010 10:51 AM  Follow-up for Phone Call        LM Follow-up by: Golden Circle RN,  August 21, 2010 10:52 AM  Additional Follow-up for Phone Call Additional follow up Details #1::        told her we have no appts left. offered UC. states she will go there Additional Follow-up by: Golden Circle RN,  August 21, 2010 11:01 AM

## 2010-12-12 NOTE — Assessment & Plan Note (Signed)
Summary: hosp f/u/[place]   Vital Signs:  Patient profile:   40 year old female Pulse rate:   64 / minute BP sitting:   110 / 70  (right arm) Cuff size:   regular  Vitals Entered By: Arlyss Repress CMA, (May 23, 2010 10:48 AM) CC: hospital f/up Is Patient Diabetic? No   Primary Care Provider:  Luretha Murphy NP  CC:  hospital f/up.  History of Present Illness: Hospital follow up, discharged from [facility] 05/12/10.  Admitted for severe abdominal pain and hemetemsis.  EGD found multiple gastric and EGD junction ulcerations, treatment at this time with high dose PPI, and sulcrafate.  She also has been using narcotics for the pain.  She has follow up with Dr. Loreta Ave later this month.  She is still having epigastric pain and denies vomiting.  She continues to smoke, has been up to 3 ppd.  She had severe interstitial cystitis requiring bladder treatments every 3 weeks, she has known COPD, and now peptic ulcerations and still is smoking 1 ppd.  She has purchased the nicotine patches but has not decided to quit.  She repeats " I am doing good" over and over.    Hospital dischage list both albuterol as a med to be continued and as a med to be discontinued.  She is dependent of the treatments and MDI for her severe chronic bronchitis.  Current Medications (verified): 1)  Ventolin Hfa 108 (90 Base) Mcg/act Aers (Albuterol Sulfate) .... 2 Puffs Qid As Needed 2)  Clonazepam 1 Mg Tabs (Clonazepam) .... Take 1 Tablet By Mouth Three Times A Day 3)  Synthroid 150 Mcg Tabs (Levothyroxine Sodium) .... One Daily 4)  Trazodone Hcl 100 Mg Tabs (Trazodone Hcl) .Marland Kitchen.. 1 Tab At Bedtime 5)  Calcium 500/d 500-200 Mg-Unit  Tabs (Calcium Carbonate-Vitamin D) .... Tid 6)  Albuterol Sulfate (2.5 Mg/22ml) 0.083% Nebu (Albuterol Sulfate) .... Via Neb Every 6h As Needed 7)  Metoclopramide Hcl 5 Mg/ml Soln (Metoclopramide Hcl) .... One Q 6 8)  Flovent Hfa 44 Mcg/act Aero (Fluticasone Propionate  Hfa) .Marland Kitchen.. 1 Puff Bid 9)   Nexium 40 Mg Cpdr (Esomeprazole Magnesium) .... Bid 10)  Carafate 1 Gm Tabs (Sucralfate) .... Qid 11)  Percocet 5-325 Mg Tabs (Oxycodone-Acetaminophen) .... One Daily  Allergies (verified): No Known Drug Allergies  Review of Systems General:  Denies chills, fever, loss of appetite, sweats, and weight loss. GI:  Complains of abdominal pain; denies vomiting and vomiting blood. GU:  Complains of dysuria; chronic dysuria-IC. Psych:  Denies anxiety, depression, mental problems, and panic attacks.   Impression & Recommendations:  Problem # 1:  PEPTIC ULCER DISEASE (ICD-533.90) Under medical management, follow up with GI later this month Her updated medication list for this problem includes:    Nexium 40 Mg Cpdr (Esomeprazole magnesium) ..... Bid    Carafate 1 Gm Tabs (Sucralfate) ..... Qid  Orders: [facility]- Est  Level 4 ([address])  Problem # 2:  COPD (ICD-496) Reinforced need to QUIT The following medications were removed from the medication list:    Qvar 80 Mcg/act Aers (Beclomethasone dipropionate) ..... Bid Her updated medication list for this problem includes:    Ventolin Hfa 108 (90 Base) Mcg/act Aers (Albuterol sulfate) .Marland Kitchen... 2 puffs qid as needed    Albuterol Sulfate (2.5 Mg/80ml) 0.083% Nebu (Albuterol sulfate) .Marland Kitchen... Via neb every 6h as needed    Flovent Hfa 44 Mcg/act Aero (Fluticasone propionate  hfa) .Marland Kitchen... 1 puff bid  Problem # 3:  HYPOTHYROIDISM, UNSPECIFIED (ICD-244.9)  recheck TSH, had been low in hospital.  Proptosis is old and has been present since Graves diagnosis years ago.  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 One daily  Orders: TSH-[facility] ([address]) [facility]- Est  Level 4 ([address]11)  Problem # 4:  DEPRESSION, MAJOR, RECURRENT (ICD-296.30) Stable, on trazodone and clonazepam  Problem # 5:  CYSTITIS, INTERSTITIAL (ICD-595.1) Under care of  Urology for this, received bladder irrigation treatments every month  Complete Medication List: 1)  Ventolin Hfa 108 (90 Base) Mcg/act Aers (Albuterol sulfate) .... 2 puffs qid as needed 2)  Clonazepam 1 Mg Tabs (Clonazepam) .... Take 1 tablet by mouth three times a day 3)  Synthroid 150 Mcg Tabs (Levothyroxine sodium) .... One daily 4)  Trazodone Hcl 100 Mg Tabs (Trazodone hcl) .Marland Kitchen.. 1 tab at bedtime 5)  Calcium 500/d 500-200 Mg-unit Tabs (Calcium carbonate-vitamin d) .... Tid 6)  Albuterol Sulfate (2.5 Mg/54ml) 0.083% Nebu (Albuterol sulfate) .... Via neb every 6h as needed 7)  Metoclopramide Hcl 5 Mg/ml Soln (Metoclopramide hcl) .... One q 6 8)  Flovent Hfa 44 Mcg/act Aero (Fluticasone propionate  hfa) .Marland Kitchen.. 1 puff bid 9)  Nexium 40 Mg Cpdr (Esomeprazole magnesium) .... Bid 10)  Carafate 1 Gm Tabs (Sucralfate) .... Qid 11)  Percocet 5-325 Mg Tabs (Oxycodone-acetaminophen) .... One daily  Patient Instructions: 1)  Please schedule a follow-up appointment in 2 months.  Prescriptions: PERCOCET 5-325 MG TABS (OXYCODONE-ACETAMINOPHEN) one daily Brand medically necessary #30 x 0   Entered and Authorized by:   Luretha Murphy NP   Signed by:   Luretha Murphy NP on 05/23/2010   Method used:   Print then Give to Patient   RxID:   [id number] PERCOCET 5-325 MG TABS (OXYCODONE-ACETAMINOPHEN) one daily Brand medically necessary #0 x 0   Entered and Authorized by:   Luretha Murphy NP   Signed by:   Luretha Murphy NP on 05/23/2010   Method used:   Print then Give to Patient   RxID:   [id number]

## 2010-12-12 NOTE — Procedures (Signed)
Summary: GI Endo Procedure  GI Endo Procedure   Imported By: De Nurse 08/17/2010 15:07:56  _____________________________________________________________________  External Attachment:    Type:   Image     Comment:   External Document

## 2010-12-12 NOTE — Progress Notes (Signed)
Summary: refill  Phone Note Refill Request Call back at Home Phone [phone] Message from:  Patient  Refills Requested: Medication #1:  CLONAZEPAM 1 MG TABS Take 1 tablet by mouth three times a day [facility]  Initial call taken by: De Nurse,  January 09, 2010 10:33 AM    Prescriptions: CLONAZEPAM 1 MG TABS (CLONAZEPAM) Take 1 tablet by mouth three times a day  #90 x 0   Entered and Authorized by:   Luretha Murphy NP   Signed by:   Luretha Murphy NP on 01/09/2010   Method used:   Printed then faxed to ...       [facility]* (retail)       [address] [place], [place]  [address]       Ph: [phone] or [phone]       Fax: [phone]   RxID:   [id number]

## 2010-12-12 NOTE — Miscellaneous (Signed)
  Clinical Lists Changes  Problems: Removed problem of TREMOR, ESSENTIAL/FAMILIAL (ICD-333.1) Removed problem of DEPRESSION, MAJOR, RECURRENT (ICD-296.30)

## 2010-12-12 NOTE — Progress Notes (Signed)
  Phone Note Refill Request Call back at [phone]   Refills Requested: Medication #1:  CLONAZEPAM 1 MG TABS Take 1 tablet by mouth three times a day (patient needs to be seen for further refills) Initial call taken by: Abundio Miu,  October 11, 2010 4:21 PM    Prescriptions: CLONAZEPAM 1 MG TABS (CLONAZEPAM) Take 1 tablet by mouth three times a day (patient needs to be seen for further refills)  #90 x 0   Entered and Authorized by:   Luretha Murphy NP   Signed by:   Luretha Murphy NP on 10/11/2010   Method used:   Printed then faxed to ...       [facility]* (retail)       [address] [place], [place]  [address]       Ph: [phone] or [phone]       Fax: [phone]   RxID:   [id number]

## 2010-12-12 NOTE — Progress Notes (Signed)
Summary: Rx Req  Phone Note Refill Request Call back at Home Phone [phone] Message from:  Patient  Refills Requested: Medication #1:  CLONAZEPAM 1 MG TABS Take 1 tablet by mouth three times a day [facility]  Initial call taken by: Clydell Hakim,  Apr 11, 2010 4:27 PM    Prescriptions: CLONAZEPAM 1 MG TABS (CLONAZEPAM) Take 1 tablet by mouth three times a day  #90 x 0   Entered and Authorized by:   Luretha Murphy NP   Signed by:   Luretha Murphy NP on 04/11/2010   Method used:   Printed then faxed to ...       [facility]* (retail)       [address] [place], [place]  [address]       Ph: [phone] or [phone]       Fax: [phone]   RxID:   [id number]

## 2010-12-12 NOTE — Progress Notes (Signed)
Summary: phn msg  Phone Note Call from Patient Call back at [facility] Phone [phone]   Caller: Patient Summary of Call: Pt asking to speak with Luretha Murphy.  Said there was a couple of questions she needs to ask her. Initial call taken by: Clydell Hakim,  December 14, 2009 11:00 AM  Follow-up for Phone Call        Would like all 3 inhalers called in so she can recieve multiple ones at a time, so she does not run out.  Pt uses [facility] Follow-up by: Jone Baseman CMA,  December 14, 2009 11:10 AM    New/Updated Medications: VENTOLIN HFA 108 (90 BASE) MCG/ACT AERS (ALBUTEROL SULFATE) 2 puffs qid as needed Prescriptions: VENTOLIN HFA 108 (90 BASE) MCG/ACT AERS (ALBUTEROL SULFATE) 2 puffs qid as needed  #2 x 6   Entered and Authorized by:   Luretha Murphy NP   Signed by:   Luretha Murphy NP on 12/14/2009   Method used:   Electronically to        [facility]* (retail)       [address] [place], [place]  [address]       Ph: [phone] or [phone]       Fax: [phone]   RxID:   [id number]

## 2010-12-12 NOTE — Miscellaneous (Signed)
  Clinical Lists Changes  Medications: Removed medication of CARAFATE 1 GM TABS (SUCRALFATE) qid Removed medication of NEXIUM 40 MG CPDR (ESOMEPRAZOLE MAGNESIUM) bid Removed medication of METOCLOPRAMIDE HCL 5 MG/ML SOLN (METOCLOPRAMIDE HCL) one q 6 Removed medication of PERCOCET 5-325 MG TABS (OXYCODONE-ACETAMINOPHEN) one daily [BMN] Removed medication of FLOVENT HFA 44 MCG/ACT AERO (FLUTICASONE PROPIONATE  HFA) 1 puff bid Added new medication of NEXIUM 40 MG CPDR (ESOMEPRAZOLE MAGNESIUM) one two times a day  Updated after discussion with Massie Maroon D; with [facility] (Medicaid case management program) Luretha Murphy NP  September 08, 2010 11:07 AM

## 2010-12-14 NOTE — Assessment & Plan Note (Signed)
Summary: f/u & flu shot/bmc   Vital Signs:  Patient profile:   40 year old female Weight:      148 pounds Temp:     97.9 degrees F oral Pulse rate:   75 / minute Pulse rhythm:   regular BP sitting:   127 / 85  (left arm) Cuff size:   regular  Vitals Entered By: Loralee Pacas CMA (October 24, 2010 8:37 AM) CC: follow-up visit   Primary Care Provider:  Luretha Murphy NP  CC:  follow-up visit.  History of Present Illness: 3 months follow up visit, on benzo for chronic anxiety disorder.  Receiving intra-bladder treatments for IS by urology  Followed by Dr. Loreta Ave for gastric ulcers  Still smoking, here with her brother who just got out of jail after 20 years.  He is going to give her a new tattoo today.  Habits & Providers  Alcohol-Tobacco-Diet     Tobacco Status: current     Tobacco Counseling: to quit use of tobacco products  Current Medications (verified): 1)  Ventolin Hfa 108 (90 Base) Mcg/act Aers (Albuterol Sulfate) .... 2 Puffs Qid As Needed 2)  Clonazepam 1 Mg Tabs (Clonazepam) .... Take 1 Tablet By Mouth Three Times A Day (May Fill 11/10/10) 3)  Synthroid 150 Mcg Tabs (Levothyroxine Sodium) .... One Daily 4)  Trazodone Hcl 100 Mg Tabs (Trazodone Hcl) .Marland Kitchen.. 1 Tab At Bedtime 5)  Calcium 500/d 500-200 Mg-Unit  Tabs (Calcium Carbonate-Vitamin D) .... Tid 6)  Albuterol Sulfate (2.5 Mg/37ml) 0.083% Nebu (Albuterol Sulfate) .... Via Neb Every 6h As Needed 7)  Nexium 40 Mg Cpdr (Esomeprazole Magnesium) .... One Two Times A Day 8)  Guaiatussin Ac 100-10 Mg/8ml Syrp (Guaifenesin-Codeine) .... 2 Teaspoonful Two Times A Day As Needed Cough, 250 Cc  Allergies: No Known Drug Allergies  Review of Systems      See HPI Resp:  Complains of cough, sputum productive, and wheezing.  Physical Exam  General:  Well-developed,well-nourished,in no acute distress; alert,appropriate and cooperative throughout examination Lungs:  normal respiratory effort, diffuse wheeze Heart:  normal  rate and regular rhythm.     Impression & Recommendations:  Problem # 1:  TOBACCO DEPENDENCE (ICD-305.1) counseled to quit, not comtemplative Orders: [facility]- Est Level  3 ([address]40)  Problem # 2:  ANXIETY (ICD-300.00)  Her updated medication list for this problem includes:    Clonazepam 1 Mg Tabs (Clonazepam) .Marland Kitchen... Take 1 tablet by mouth three times a day (may fill 11/10/10)    Trazodone Hcl 100 Mg Tabs (Trazodone hcl) .Marland Kitchen... 1 tab at bedtime  Orders: [facility]- Est Level  3 ([address])  Problem # 3:  ASTHMA, PERSISTENT, MODERATE (ICD-493.90)  Her updated medication list for this problem includes:    Ventolin Hfa 108 (90 Base) Mcg/act Aers (Albuterol sulfate) .Marland Kitchen... 2 puffs qid as needed    Albuterol Sulfate (2.5 Mg/43ml) 0.083% Nebu (Albuterol sulfate) .Marland Kitchen... Via neb every 6h as needed  Orders: [facility]- Est Level  3 ([address]213)  Complete Medication List: 1)  Ventolin Hfa 108 (90 Base) Mcg/act Aers (Albuterol sulfate) .... 2 puffs qid as needed 2)  Clonazepam 1 Mg Tabs (Clonazepam) .... Take 1 tablet by mouth three times a day (may fill 11/10/10) 3)  Synthroid 150 Mcg Tabs (Levothyroxine sodium) .... One daily 4)  Trazodone Hcl 100 Mg Tabs (Trazodone hcl) .Marland Kitchen.. 1 tab at bedtime 5)  Calcium 500/d 500-200 Mg-unit Tabs (Calcium carbonate-vitamin d) .... Tid 6)  Albuterol Sulfate (2.5 Mg/43ml) 0.083% Nebu (Albuterol sulfate) .... Via  neb every 6h as needed 7)  Nexium 40 Mg Cpdr (Esomeprazole magnesium) .... One two times a day 8)  Guaiatussin Ac 100-10 Mg/37ml Syrp (Guaifenesin-codeine) .... 2 teaspoonful two times a day as needed cough, 250 cc  Other Orders: Lipid-[facility] ([address])  Patient Instructions: 1)  Please schedule a follow-up appointment in 3 months .  2)  Stop smoking tips: Choose a quit date. Cut down before the quit date. Decide what you will do as a substitute when you feel the urge to smoke(gum, toothpick, exercise).  3)  Tobacco is very bad for your health and your loved ones ! You should  stop smoking !  Prescriptions: VENTOLIN HFA 108 (90 BASE) MCG/ACT AERS (ALBUTEROL SULFATE) 2 puffs qid as needed  #1 x 11   Entered and Authorized by:   Luretha Murphy NP   Signed by:   Luretha Murphy NP on 10/24/2010   Method used:   Electronically to        [facility]* (retail)       [address] [place], [place]  [address]       Ph: [phone] or [phone]       Fax: [phone]   RxID:   [id number] CLONAZEPAM 1 MG TABS (CLONAZEPAM) Take 1 tablet by mouth three times a day (may fill 11/10/10) Brand medically necessary #90 x 0   Entered and Authorized by:   Luretha Murphy NP   Signed by:   Luretha Murphy NP on 10/24/2010   Method used:   Print then Give to Patient   RxID:   [id number] GUAIATUSSIN AC 100-10 MG/5ML SYRP (GUAIFENESIN-CODEINE) 2 teaspoonful two times a day as needed cough, 250 cc Brand medically necessary #1 x 9   Entered and Authorized by:   Luretha Murphy NP   Signed by:   Luretha Murphy NP on 10/24/2010   Method used:   Print then Give to Patient   RxID:   [id number]    Orders Added: 1)  Lipid-[facility] [[id number]] 2)  [facility]- Est Level  3 [[address]]     Prevention & Chronic Care Immunizations   Influenza vaccine: Fluvax Non-MCR  (09/26/2009)   Influenza vaccine due: 10/12/2008    Tetanus booster: 01/15/2004: Done.   Tetanus booster due: 01/14/2014    Pneumococcal vaccine: Not documented  Other Screening   Pap smear: Done.  (01/15/2004)   Pap smear action/deferral: Not indicated S/P hysterectomy  (09/26/2009)   Pap smear due: Not Indicated   Smoking status: current  (10/24/2010)   Smoking cessation counseling: YES  (10/24/2010)  Lipids   Total Cholesterol: 165  (12/10/2008)   LDL: 91  (12/10/2008)   LDL Direct: Not documented   HDL: 53  (12/10/2008)   Triglycerides: 105  (12/10/2008)   Nursing Instructions: Give Flu vaccine today   Appended Document: f/u & flu  shot/bmc     Allergies: No Known Drug Allergies   Complete Medication List: 1)  Ventolin Hfa 108 (90 Base) Mcg/act Aers (Albuterol sulfate) .... 2 puffs qid as needed 2)  Clonazepam 1 Mg Tabs (Clonazepam) .... Take 1 tablet by mouth three times a day (may fill 11/10/10) 3)  Synthroid 150 Mcg Tabs (Levothyroxine sodium) .... One daily 4)  Trazodone Hcl 100 Mg Tabs (Trazodone hcl) .Marland Kitchen.. 1 tab at bedtime 5)  Calcium 500/d 500-200 Mg-unit Tabs (Calcium carbonate-vitamin d) .... Tid 6)  Albuterol Sulfate (2.5  Mg/57ml) 0.083% Nebu (Albuterol sulfate) .... Via neb every 6h as needed 7)  Nexium 40 Mg Cpdr (Esomeprazole magnesium) .... One two times a day 8)  Guaiatussin Ac 100-10 Mg/87ml Syrp (Guaifenesin-codeine) .... 2 teaspoonful two times a day as needed cough, 250 cc  Other Orders: Influenza Vaccine NON MCR ([address])   Orders Added: 1)  Influenza Vaccine NON MCR [00028]   Immunizations Administered:  Influenza Vaccine # 1:    Vaccine Type: Fluvax Non-MCR    Site: right deltoid    Mfr: GlaxoSmithKline    Dose: 0.5 ml    Route: IM    Given by: Loralee Pacas CMA    Exp. Date: 05/12/2011    Lot #: [id number]    VIS given: 06/06/10 version given October 24, 2010.  Flu Vaccine Consent Questions:    Do you have a history of severe allergic reactions to this vaccine? no    Any prior history of allergic reactions to egg and/or gelatin? no    Do you have a sensitivity to the preservative Thimersol? no    Do you have a past history of Guillan-Barre Syndrome? no    Do you currently have an acute febrile illness? no    Have you ever had a severe reaction to latex? no    Vaccine information given and explained to patient? yes    Are you currently pregnant? no   Immunizations Administered:  Influenza Vaccine # 1:    Vaccine Type: Fluvax Non-MCR    Site: right deltoid    Mfr: GlaxoSmithKline    Dose: 0.5 ml    Route: IM    Given by: Loralee Pacas CMA    Exp. Date: 05/12/2011     Lot #: [id number]    VIS given: 06/06/10 version given October 24, 2010.

## 2010-12-20 ENCOUNTER — Telehealth: Payer: Self-pay | Admitting: Family Medicine

## 2010-12-20 NOTE — Telephone Encounter (Signed)
Left message that she must make an apt to be seen.

## 2010-12-20 NOTE — Progress Notes (Signed)
Summary: refill  Phone Note Refill Request Call back at [phone] Message from:  Patient  Refills Requested: Medication #1:  CLONAZEPAM 1 MG TABS Take 1 tablet by mouth three times a day (may fill 11/10/10) [BMN] Initial call taken by: De Nurse,  December 12, 2010 9:16 AM    Prescriptions: CLONAZEPAM 1 MG TABS (CLONAZEPAM) Take 1 tablet by mouth three times a day (may fill 11/10/10)  #90 x 0   Entered and Authorized by:   Luretha Murphy NP   Signed by:   Luretha Murphy NP on 12/12/2010   Method used:   Printed then faxed to ...       [facility]* (retail)       [address] [place], [place]  [address]       Ph: [phone] or [phone]       Fax: [phone]   RxID:   [id number]

## 2011-01-08 ENCOUNTER — Telehealth: Payer: Self-pay | Admitting: Family Medicine

## 2011-01-08 DIAGNOSIS — F419 Anxiety disorder, unspecified: Secondary | ICD-10-CM

## 2011-01-08 MED ORDER — CLONAZEPAM 1 MG PO TABS: 1.0000 mg | ORAL_TABLET | Freq: Three times a day (TID) | ORAL | Status: DC

## 2011-01-08 NOTE — Telephone Encounter (Signed)
Fax to [facility] [phone]

## 2011-01-08 NOTE — Telephone Encounter (Signed)
Requesting refill on klonopin

## 2011-01-23 LAB — POCT HEMOGLOBIN-HEMACUE: Hemoglobin: 14.7 g/dL (ref 12.0–15.0)

## 2011-01-28 LAB — LIPASE, BLOOD: Lipase: 33 U/L (ref 11–59)

## 2011-01-28 LAB — COMPREHENSIVE METABOLIC PANEL
ALT: 11 U/L (ref 0–35)
ALT: 14 U/L (ref 0–35)
AST: 21 U/L (ref 0–37)
Albumin: 4.2 g/dL (ref 3.5–5.2)
Alkaline Phosphatase: 64 U/L (ref 39–117)
Alkaline Phosphatase: 67 U/L (ref 39–117)
BUN: 9 mg/dL (ref 6–23)
CO2: 29 mEq/L (ref 19–32)
CO2: 29 mEq/L (ref 19–32)
Calcium: 9.1 mg/dL (ref 8.4–10.5)
Chloride: 98 mEq/L (ref 96–112)
Chloride: 99 mEq/L (ref 96–112)
Creatinine, Ser: 0.85 mg/dL (ref 0.4–1.2)
GFR calc Af Amer: >60 mL/min (ref >60)
GFR calc non Af Amer: >60 mL/min (ref >60)
Glucose, Bld: 122 mg/dL — ABNORMAL HIGH (ref 70–99)
Glucose, Bld: 128 mg/dL — ABNORMAL HIGH (ref 70–99)
Potassium: 3.1 mEq/L — ABNORMAL LOW (ref 3.5–5.1)
Potassium: 3.3 mEq/L — ABNORMAL LOW (ref 3.5–5.1)
Sodium: 138 mEq/L (ref 135–145)
Sodium: 140 mEq/L (ref 135–145)
Total Bilirubin: 0.5 mg/dL (ref 0.3–1.2)
Total Protein: 6.8 g/dL (ref 6.0–8.3)
Total Protein: 7 g/dL (ref 6.0–8.3)

## 2011-01-28 LAB — DIFFERENTIAL
Basophils Relative: 1 % (ref 0–1)
Eosinophils Absolute: 0.1 10*3/uL (ref 0.0–0.7)
Lymphs Abs: 1 10*3/uL (ref 0.7–4.0)
Monocytes Relative: 3 % (ref 3–12)
Neutro Abs: 9.7 10*3/uL — ABNORMAL HIGH (ref 1.7–7.7)
Neutrophils Relative %: 59 % (ref 43–77)
Neutrophils Relative %: 87 % — ABNORMAL HIGH (ref 43–77)

## 2011-01-28 LAB — CBC
HCT: 41.9 % (ref 36.0–46.0)
HCT: 44.9 % (ref 36.0–46.0)
Hemoglobin: 14.4 g/dL (ref 12.0–15.0)
Hemoglobin: 15.4 g/dL — ABNORMAL HIGH (ref 12.0–15.0)
MCH: 31.1 pg (ref 26.0–34.0)
MCH: 31.5 pg (ref 26.0–34.0)
MCH: 31.9 pg (ref 26.0–34.0)
MCHC: 34.4 g/dL (ref 30.0–36.0)
MCHC: 34.6 g/dL (ref 30.0–36.0)
MCHC: 35 g/dL (ref 30.0–36.0)
MCV: 90.3 fL (ref 78.0–100.0)
MCV: 91.1 fL (ref 78.0–100.0)
Platelets: 198 10*3/uL (ref 150–400)
Platelets: 204 10*3/uL (ref 150–400)
Platelets: 223 10*3/uL (ref 150–400)
RBC: 4.64 MIL/uL (ref 3.87–5.11)
RBC: 4.96 MIL/uL (ref 3.87–5.11)
RDW: 12.5 % (ref 11.5–15.5)
RDW: 12.8 % (ref 11.5–15.5)
WBC: 11.1 10*3/uL — ABNORMAL HIGH (ref 4.0–10.5)
WBC: 13.8 10*3/uL — ABNORMAL HIGH (ref 4.0–10.5)

## 2011-01-28 LAB — URINALYSIS, ROUTINE W REFLEX MICROSCOPIC
Bilirubin Urine: NEGATIVE
Ketones, ur: 15 mg/dL — AB
Nitrite: NEGATIVE
pH: 8.5 — ABNORMAL HIGH (ref 5.0–8.0)

## 2011-01-28 LAB — BASIC METABOLIC PANEL
BUN: 6 mg/dL (ref 6–23)
CO2: 26 mEq/L (ref 19–32)
Chloride: 105 mEq/L (ref 96–112)
Creatinine, Ser: 0.86 mg/dL (ref 0.4–1.2)
Glucose, Bld: 104 mg/dL — ABNORMAL HIGH (ref 70–99)

## 2011-01-28 LAB — RAPID URINE DRUG SCREEN, HOSP PERFORMED
Cocaine: NOT DETECTED
Opiates: POSITIVE — AB
Tetrahydrocannabinol: POSITIVE — AB

## 2011-01-28 LAB — AMYLASE: Amylase: 51 U/L (ref 0–105)

## 2011-01-28 LAB — URINE MICROSCOPIC-ADD ON

## 2011-02-08 ENCOUNTER — Telehealth: Payer: Self-pay | Admitting: Family Medicine

## 2011-02-08 DIAGNOSIS — F419 Anxiety disorder, unspecified: Secondary | ICD-10-CM

## 2011-02-08 MED ORDER — CLONAZEPAM 1 MG PO TABS: 1.0000 mg | ORAL_TABLET | Freq: Three times a day (TID) | ORAL | Status: DC

## 2011-02-08 NOTE — Telephone Encounter (Signed)
Needs refill on Klonopin called to [facility]

## 2011-03-12 ENCOUNTER — Telehealth: Payer: Self-pay | Admitting: Family Medicine

## 2011-03-12 DIAGNOSIS — F419 Anxiety disorder, unspecified: Secondary | ICD-10-CM

## 2011-03-12 MED ORDER — CLONAZEPAM 1 MG PO TABS: 1.0000 mg | ORAL_TABLET | Freq: Three times a day (TID) | ORAL | Status: DC

## 2011-03-12 NOTE — Telephone Encounter (Signed)
Patient must be seen in the next 30 days prior to any additional refills.  Printed and faxed today.

## 2011-03-12 NOTE — Telephone Encounter (Signed)
Asking for refill on clonazepam to be called into pharmacy

## 2011-03-30 NOTE — Group Therapy Note (Signed)
NAME:  ZERIYAH, [name] NO.:  [id number]   MEDICAL RECORD NO.:  [id number]                   PATIENT TYPE:  OUT   LOCATION:  [facility]                           FACILITY:  [facility]   PHYSICIAN:  Melinda Lincoln, MD                   DATE OF BIRTH:  16-Aug-1971   DATE OF SERVICE:  10/19/2003                                    CLINIC NOTE   HISTORY:  The patient is a 40 year old female status post BTL September 06, 2003 who comes complaining of lower abdominal cramping.  The patient states  this pain has been there for several months and worsens during and after  intercourse.  At the time of the laparoscopic tubal ligation patient was  noted to have no evidence of adhesions, endometriosis, or endometriosis  scars.  The patient had a completely normal pelvis.  Today the patient  further gives history of pain as bladder becomes full and states she must  urinate at time of urge secondary to pain and urgency.  The patient has not  had a period since stopping Depo.  The patient reports no nausea, vomiting,  diarrhea.   PAST MEDICAL HISTORY:  1. Graves' disease.  2. Bipolar disorder.  3. Asthma.   PHYSICAL EXAMINATION:  VITAL SIGNS:  Temperature 99.3, pulse 96, blood  pressure 119/79, weight 176.1, height 5 feet 0 inches.  ABDOMEN:  Soft, nontender, nondistended.  Umbilicus well healed incision.  PELVIC:  External genitalia Tanner 5.  Vagina tender anteriorly at the level  of the bladder.  Cervix closed, nontender.  Uterus nontender, small.  Adnexa:  No masses.  Nontender.   ASSESSMENT/PLAN:  A 40 year old female with chronic pelvic pain,  questionable interstitial cystitis.  1. Send a UA urine culture today.  2. Start on Elmiron 100 mg p.o. t.i.d.  The patient advised that it takes     several weeks for a month for the Elmiron to work and to be patient.  If     this does not resolve her pain will refer to urologist.  3. Return to clinic in two months.  4. Pap  smear June 2005.                                               Melinda Lincoln, MD    KL/MEDQ  D:  10/19/2003  T:  10/19/2003  Job:  [id number]

## 2011-03-30 NOTE — Discharge Summary (Signed)
[place]. [facility]  Patient:    [name], [name]                       MRN: [id number] Adm. Date:  [id number] Disc. Date: [id number] Attending:  Tammi Sou Dictator:   OB/GYN Resident                           Discharge Summary  ADMISSION DIAGNOSES: 1. Intrauterine pregnancy at 23-4/7ths weeks. 2. Gastroenteritis. 3. Dehydration. 4. Asthma.  DISCHARGE DIAGNOSES: 1. Intrauterine pregnancy at 23-4/7ths weeks. 2. Gastroenteritis. 3. Dehydration. 4. Asthma.  HISTORY OF PRESENT ILLNESS:  A 40 year old G3, P1-0-1-1 at 23-4/7ths weeks who presented with a one-day history of multiple episodes of yellow emesis, multiple episodes of watery diarrhea, and diffuse crampy abdominal pain.  On presentation to MAU, patient had been unable to tolerate liquids for greater than 8 hours.  She  denied any URI symptoms, dyspnea, chest pain, or palpitations.  She did report subjective fevers and chills.  She did have a history of significant nausea during her pregnancy and had been taking Unisom and vitamin B6 throughout.  In maternity admissions, patient received 2 L of D-5 LR with several doses of IV Phenergan.  However, patient continued to have feelings of nausea and retching. At that point, it was decided to admit the patient for overnight rehydration.  HOSPITAL COURSE:  Patient was admitted and received D-5 LR with 20 mEq of potassium and 25 mg of Phenergan per L at 125 cc per hour.  In the MAU, it was also noted  that patient had significant inspiratory and expiratory wheezes on examination f the chest and patient received q.2h. nebulizer treatments x 3.  She required no  additional Phenergan doses and no additional nebulizers.  On the morning of discharge, patient reported a complete resolution of nausea and was tolerating clear liquids for breakfast.  Patient reported complete resolution of her abdominal pain, however did report some soreness  with movement and coughing.  With regards to her respiratory status, patient reported returning to baseline and was not complaining of any dyspnea prior to discharge.  On exam, she did have inspiratory and expiratory wheezes; however, there was good air movement and her pulse oximetry was 97%.  She has a follow-up appointment scheduled with her pulmonologist in two weeks.  DISCHARGE MEDICATIONS: 1. Vitamin B6 50 mg q.d. 2. Unisom one tablet p.o. q.h.s. as before.  DIET:  No restrictions.  ACTIVITY:  As tolerated.  SEXUAL ACTIVITY:  No restrictions.  FOLLOW-UP:  Patient has appointment to return to high risk clinic on Thursday, February 15, at 10:45 a.m.  She is encouraged to keep this appointment. Patient also has a repeat ultrasound scheduled for next week.  DISCHARGE INSTRUCTIONS: 1. Patient is instructed to return to maternity admissions for any recurrence of    vomiting and diarrhea, any increase in abdominal pain, or for a development f    contractions or vaginal discharge. 2. She is instructed to keep follow-up appointments as above. 3. She will keep her pulmonary appointment in two weeks. 4. Smoking cessation is encouraged. DD:  12/26/99 TD:  12/27/99 Job: [id number]

## 2011-03-30 NOTE — Discharge Summary (Signed)
NAME:  [name], [name]                          ACCOUNT NO.:  [id number]   MEDICAL RECORD NO.:  [id number]                   PATIENT TYPE:  IPS   LOCATION:  0506                                 [facility]ACILITY[facility]   PHYSICIAN:  Hipolito Bayley, M.D.               DATE OF BIRTH:  08/25/1971   DATE OF ADMISSION:  01/27/2003  DATE OF DISCHARGE:  02/01/2003                                 DISCHARGE SUMMARY   PATIENT IDENTIFICATION:  The patient is a 40 year old single white female  who presented to the emergency department at [facility] with a  history of suicidal ideation.  Prior to admission, she made superficial cuts  with a knife on her left wrist.  The patient felt that recurrent depression  stemmed from reaction to unfaithfulness of her boyfriend.  At the time of  the initial examination, she was denying suicidal or homicidal ideation.  The patient does not have a history of any formal treatment for depression  with the exception of taking some Lexapro for a few months, prescribed by  her family physician.  Her family history is significant for suicidal death  of her uncle.  The patient denies substance abuse at present but on and off,  was doing marijuana and cocaine; no alcohol.   MEDICAL PROBLEMS:  Medical problems included asthma and some thyroid  problems, unspecified.   HOSPITAL COURSE:  After admission to the ward, the patient was placed on  special observation.  She was able to contract for safety.  Since she gave a  history of instability of her mood and impulsivity, I started her on  Depakote 500 mg at bedtime in the ER form.  The patient said that Lexapro  made her anxious and Lexapro was replaced with Celexa with good results.  The meeting was held with the patient and her common law husband and there  was still a lot of tension between the two of them.  On March 19, she still  had vague thoughts about suicide but reported often destructive thoughts,  racing  thoughts, and uncontrollable anger as well as mood swings and a  history of psychiatric problems, it is felt that the patient may have some  form of bipolar disorder although the symptoms could be coming from the use  of drugs.  On March 20, she slept better, had no problems with Depakote,  less anxious.  Because of anxiety, I decided to start her on Neurontin 100  mg up to three times a day.  On March 22, the patient was seen by Jeanice Lim, M.D.  He felt that she had improved markedly and felt that she was  ready for discharge.   DISCHARGE DIAGNOSES:   AXIS I:  1. Bipolar disorder, not otherwise specified.  2. Substance-induced mood disorder.   AXIS II:  Personality disorder, not otherwise specified.   AXIS III:  1. Thyroid  problems, rule out Grave's disease.  2. Asthma.  3. Status post self-inflicted cut to left wrist.   AXIS IV:  Psychosocial stressors were moderate, domestic problems.   AXIS V:  Global assessment of functioning upon admission was 28, maximum for  the past year 70, upon discharge 65.   LABORATORY DATA:  Review of blood work: Valproic acid was 27.4 on March 21.  Thyroid function tests: Normal.  Liver function tests: Essentially normal.  Negative urinalysis.   DISCHARGE MEDICATIONS:  As per Dr. Broadus John instructions, the patient was  discharged home on the following medications:  1. Seroquel 75 mg every six hours as needed for anxiety or agitation and one     tablet at bedtime.  2. Advair inhaler twice a day.  3. Celexa 20 mg in the morning.  4. Neurontin 100 mg three times a day.  5. Levothyroxine 177 mcg in the morning.  6. Depakote ER 500 mg, two pills at bedtime.   DISCHARGE INSTRUCTIONS:  The patient is supposed to follow up with [facility] and her appointment on March 23 at 10:00 in the  morning.  In the event of  deterioration in symptoms or side effects from  medications, she should call mental health or come to  the emergency room.  The patient was warned about potential side effects from medications,  especially with respect to the Depakote, history of risk to injury to the  liver as well as platelets.  The patient understood instructions and felt  that medications were helpful.  The patient was discharged on medications  and follow as outlined before.                                               Hipolito Bayley, M.D.    JS/MEDQ  D:  03/14/2003  T:  03/15/2003  Job:  [id number]

## 2011-03-30 NOTE — Op Note (Signed)
NAME:  [name], [name]                          ACCOUNT NO.:  [id number]   MEDICAL RECORD NO.:  [id number]                   PATIENT TYPE:  AMB   LOCATION:  SDC                                  FACILITY:  [facility]   PHYSICIAN:  Lesly Dukes, M.D.              DATE OF BIRTH:  [birth year]   DATE OF PROCEDURE:  09/06/2003  DATE OF DISCHARGE:                                 OPERATIVE REPORT   PREOPERATIVE DIAGNOSIS:  A 40 year old para 2-0-1-2 desiring permanent  sterilization.   POSTOPERATIVE DIAGNOSIS:  A 40 year old para 2-0-1-0 desiring permanent  sterilization.   PROCEDURE:  Laparoscopic bilateral tubal ligation.   SURGEON:  Lesly Dukes, M.D.   ANESTHESIA:  General anesthesia.   ESTIMATED BLOOD LOSS:  30 mL.   COMPLICATIONS:  None.   PATHOLOGY:  None.   FINDINGS:  Normal pelvic anatomy, no ovarian cysts, fibroids or adhesions.   DESCRIPTION OF PROCEDURE:  After informed consent was obtained, the patient  was taken to the operating room where general anesthesia was found to be  adequate. The patient was placed in the dorsal lithotomy position and  prepped and draped in normal sterile fashion.  The bladder was emptied with  a Foley catheter and a Hulka clip was placed on the cervix to aid in uterine  mobility.  An infraumbilical skin incision was made with scalpel and carried  down to the underlying layer of fascia with blunt dissection.  The fascia  was then tended up and entered sharply with a scalpel.  Incision was then  expanded with a Kelly clamp.Marland Kitchen  Retractor was replaced into the fascia and  the peritoneum was entered bluntly.  The Hasson trocar was then introduced  into the abdomen safely.  A pneumoperitoneum was achieved with CO2.  Using  the operative laparoscope, a probe was then inserted into the abdomen and  survey of the abdominal contents were performed.  There was no ovarian cyst,  fibroids or adhesions or evidence of endometriosis.  The Kleppinger was  then  introduced into the abdomen and each tube was identified and followed out to  its fimbriated end and cauterized three times in the middle portion.  This  was done on each side.  Good hemostasis was noted.  The pneumoperitoneum was  then released and all instruments were removed from the abdomen.  The fascia  was closed with 0 Vicryl with good anatomical result and good hemostasis.  The skin was closed with 4-0 Vicryl in a running subcuticular fashion.  Good  hemostasis and anatomical result were noted.  Dressing was placed on the  infraumbilical incision.  The patient tolerated the procedure well.  The  Hulka clip was then removed from the cervix.  Good hemostasis was noted.  All instrument, sponge, lap and needle counts correct x 2.  The patient was  taken to the recovery room in stable condition.  Lesly Dukes, M.D.   Lora Paula  D:  09/06/2003  T:  09/06/2003  Job:  [id number]

## 2011-03-30 NOTE — Group Therapy Note (Signed)
NAME:  [name], KAUS NO.:  [id number]   MEDICAL RECORD NO.:  [id number]                   PATIENT TYPE:  OUT   LOCATION:  [facility]                           FACILITY:  [facility]   PHYSICIAN:  Elsie Lincoln, MD                   DATE OF BIRTH:  1971/06/10   DATE OF SERVICE:  08/03/2003                                    CLINIC NOTE   The patient is a gravida 3, para 2-0-1-2, LMP is July 31, 2003, who  presents for preoperative workup for laparoscopic tubal ligation.  The  patient has had two vaginal deliveries, 1996 and 2001, with no  complications.  She is in a monogamous relationship with her partner of 15  years.   PAST MEDICAL HISTORY:  1. Bipolar disorder diagnosed nine months ago.  States she is doing well.  2. Asthma was diagnosed 9 to 10 years ago.  Patient unsure of her peak flow     values.  3. Recently diagnosed with migraines, two to three months.  4. She also has Graves' disease diagnosed a year ago, status post radiation     for ablation.   PAST SURGICAL HISTORY:  Diagnostic laparoscopy for pelvic pain at age 40.  No pathology was found per patient.   GYNECOLOGICAL HISTORY:  NSVD x2.  The patient had three LEEP's x2 for  cervical cancer in 1989.  The patient also states she has a history of  cysts.  Most recent ultrasound six months ago showed follicular cysts only,  with a normal uterus and ovaries bilaterally.   SOCIAL HISTORY:  Tobacco:  She smokes one and one-half packs per day for 15  years.  No alcohol or drugs.   MEDICATIONS:  Albuterol, Advair, clonazepam, Seroquel, Synthroid, Celexa,  Neurontin, trazodone, and Midrin.   REVIEW OF SYMPTOMS:  Patient having weight gain from Depo.  No shortness of  breath.  No chest pain.  No abdominal pain.  Some dyspareunia with deep  entry.  No vaginal discharge or other gynecological complaints.  No nausea,  vomiting, or diarrhea.   PHYSICAL EXAM:  GENERAL:  Well-nourished,  well-developed, in no apparent  distress.  ABDOMEN:  Soft, nontender, nondistended.  Two well-healed scars, one at  umbilicus and one at the pubic hairline from previous laparoscopy.  GENITOURINARY:  Bimanual deferred; will do in OR.  EXTREMITIES:  Nontender.  No edema.  VITAL SIGNS:  BP 129/81.  Weight 170.6, height 5 feet, 0 inches.  Pulse 92,  temperature 99.5.   ASSESSMENT AND PLAN:  This is a 40 year old gravida 3, para 2-0-1-2,  desiring permanent sterilization.  1. Tubal __________ today.  2. Patient to go to [facility] to get medical clearance,     particularly noting peak flow values.  3. Patient encouraged to quit smoking.  4. TSH to be drawn today in clinic.  5.     Patient  to be scheduled 30 days from today for BTL and to come to my office      three to four days prior to the procedure for a repeat H&P.                                               Elsie Lincoln, MD    KL/MEDQ  D:  08/03/2003  T:  08/03/2003  Job:  [id number]

## 2011-03-30 NOTE — Discharge Summary (Signed)
NAME:  [name], [name]                          ACCOUNT NO.:  [id number]   MEDICAL RECORD NO.:  [id number]                   PATIENT TYPE:  IPS   LOCATION:  0503                                 [facility]ILITY[facility]   PHYSICIAN:  Jeanice Lim, M.D.              DATE OF BIRTH:  1970-12-09   DATE OF ADMISSION:  02/08/2003  DATE OF DISCHARGE:  02/12/2003                                 DISCHARGE SUMMARY   IDENTIFYING DATA:  The patient is a 40 year old separated Caucasian  female  voluntarily admitted, presenting with a history of increased mood swings,  having  thoughts of jumping out of  a car while arguing with her boyfriend,  believing that her boyfriend was having  a affair. She described paranoid  ideation, irritability and uncontrolled  mood swings.   MEDICATIONS:  1. Celexa 20 mg.  2. Depakote ER 1000 mg q.h.s.  3. Seroquel q.6h.  p.r.n.  4. Neurontin 100 mg t.i.d.  The patient admitted to not taking her medications as prescribed, having  had difficulty getting the prescriptions filled.   ALLERGIES:  No known drug allergies.   PHYSICAL EXAMINATION:  Essentially within normal limits. Neurologically  nonfocal.   LABORATORY DATA:  Routine admission labs, CBC and CMET within normal limits.  Urine drug screen positive for marijuana. Alcohol level less than 5.  Valproic acid  level 70.   MENTAL STATUS EXAM:  The patient was alert and cooperative with good eye  contact. Casually dressed. Speech clear. Mood anxious and depressed. Affect  irritable. Thought process goal directed. Thought content negative for  dangerous ideation or psychotic symptoms. The patient denied auditory or  visual hallucinations, admitting to some generalized paranoid ideation.  Cognitively intact. Judgment and insight fair with a history of poor impulse  control.   ADMISSION DIAGNOSES:   AXIS I:  Bipolar disorder, type 2, mixed.   AXIS II:  None.   AXIS III:  Hypothyroidism.   AXIS IV:  Moderate  problems with primary support group.   AXIS V:  40/60.   HOSPITAL COURSE:  The patient was admitted and we ordered routine  p.r.n.  medications. She underwent further monitoring. She was encouraged to  participate in individual, group and milieu therapy. The patient was resumed  on Ativan, Synthroid, Neurontin, Depakote, Celexa, Seroquel, Advair, and was  optimized on Neurontin and Seroquel as well as Depakote. Benzodiazepines  were changed to Klonopin to further stabilize her mood and Depakote to  further optimize.   The patient reported a positive response to medication changes, feeling her  mood was much more stable and she was able to cope with stressors and think  before she reacted. She was discharged in improved condition.   CONDITION ON DISCHARGE:  Her mood was more euthymic and stable. Affect full,  thought process is goal directed, thought content negative for any dangerous  ideation or psychotic symptoms. The patient  reported motivation to be  compliant with medications and aftercare planning.   DISCHARGE MEDICATIONS:  1. Klonopin 0.5 mg t.i.d.  2. Advair discus inhaler 1 puff  b.i.d.  3. Levothyroxine 137 micrograms q. a.m.  4. Celexa 20 mg q. a.m.  5. Depakote ER 500 mg q. a.m.  and 2 q.h.s. and 250 mg every 3 p.m.  6. Neurontin 200 mg t.i.d.  7. Seroquel 100 mg 1/2 q. a.m.  and 1/2 every 3 p.m. and 1 q.h.s.   FOLLOW UP:  The patient was discharged to follow up with [facility] on February 17, 2003, at 10:30.   DISCHARGE DIAGNOSES:   AXIS I:  Bipolar disorder, type 2, mixed.   AXIS II:  None.   AXIS III:  Hypothyroidism.   AXIS IV:  Moderate problems with primary support group.   AXIS VKallie Locks, M.D.    JEM/MEDQ  D:  02/25/2003  T:  02/26/2003  Job:  [id number]

## 2011-03-30 NOTE — Op Note (Signed)
NAME:  DALESHA, STANBACK                ACCOUNT NO.:  [id number]   MEDICAL RECORD NO.:  [id number]          PATIENT TYPE:  AMB   LOCATION:  [facility]                         [facility]ACILITY:  [facility]   PHYSICIAN:  Maretta Bees. Vonita Moss, M.D.DATE OF BIRTH:  06-12-1971   DATE OF PROCEDURE:  12/06/2004  DATE OF DISCHARGE:                                 OPERATIVE REPORT   PREOPERATIVE DIAGNOSIS:  Rule out interstitial cystitis.   POSTOPERATIVE DIAGNOSIS:  Rule out interstitial cystitis.   PROCEDURE:  Cystoscopy and hydraulic over-distention of bladder and cold cup  bladder biopsy.   SURGEON:  Maretta Bees. Vonita Moss, M.D.   ANESTHESIA:  General.   INDICATION:  This 40 year old white female has had a long history of  frequency, painful intercourse, and pelvic pain.  She has had a previous  tubal ligation and has undergone gynecologic evaluation with no apparent  significant abnormalities.  She is brought to the OR today to rule out  interstitial cystitis which might explain pelvic pain, frequency, and  dyspareunia.   PROCEDURE:  The patient was brought to the operating room and placed in the  lithotomy position.  The external genitalia were prepped and draped in the  usual fashion.  She was cystoscoped and the bladder was perfectly normal.  She started leaking around the cystoscope as I started filling the bladder  and with urethral compression, she held it.  Only 600 cc of irrigating fluid  with 36 inches of water pressure.  Looking back in the bladder, she had  gross hemorrhage and bleeding across the bladder base and widespread  petechiae throughout all four quadrants of the bladder.  Cold cup bladder  biopsy was used to take three biopsies across the bladder base in typical  hemorrhagic area.  The biopsy sites were fulgurated with the Bugbee  electrode.  The bladder was emptied and the scope removed.  The patient was  sent to the recovery room in good condition, having been given a B&O  suppository and 30  mg Toradol IV.  She tolerated the procedure well.      LJP/MEDQ  D:  12/06/2004  T:  12/06/2004  Job:  [id number]   cc:   Lesly Dukes, M.D.

## 2011-03-30 NOTE — Op Note (Signed)
NAME:  [name], [name]                ACCOUNT NO.:  [id number]   MEDICAL RECORD NO.:  [id number]          PATIENT TYPE:  OIB   LOCATION:  9313                          [facility]ILITY[facility]   PHYSICIAN:  Ginger Carne, MD  DATE OF BIRTH:  [birth year]   DATE OF PROCEDURE:  03/06/2007  DATE OF DISCHARGE:                               OPERATIVE REPORT   PREOPERATIVE DIAGNOSES:  1. Chronic pelvic pain.  2. Genuine urinary stress incontinence.   POSTOPERATIVE DIAGNOSES:  1. Chronic pelvic pain.  2. Genuine urinary stress incontinence.  3. Stage I endometriosis.   PROCEDURE:  1. Laparoscopic-assisted vaginal hysterectomy.  2. Bilateral salpingo-oophorectomy.  3. Tension-free vaginal tape procedure.  4. Cystoscopy.   SURGEON:  Dr. Mia Creek.   ASSISTANT:  None.   COMPLICATIONS:  None immediate.   ESTIMATED BLOOD LOSS:  200 mL.   SPECIMEN:  Uterus, cervix, right and left tube and ovary to pathology.   ANESTHESIA:  General with paracervical block with Marcaine and  epinephrine.   OPERATIVE FINDINGS:  External genitalia, vulva and vagina normal.  Cervix smooth without erosions or lesions.  Laparoscopic evaluation  demonstrated normal sized uterus with endometriotic lesions in the  posterior aspect of the uterus, cervix and broad ligaments.  No adhesive  disease noted.  No evidence of femoral, inguinal or obturator hernias.  Ureters identified bilaterally throughout the pelvic course.  Large and  small bowel grossly normal.  Upper abdomen normal.  Appendix appeared  normal.   OPERATIVE PROCEDURE:  The patient prepped and draped in usual fashion  and placed in lithotomy position.  Betadine solution used for  antiseptic, and the patient was catheterized prior to procedure.  After  adequate general anesthesia, a tenaculum placed on the anterior lip of  the cervix and a Pelosi uterine manipulator placed in same.  Afterwards,  a vertical infraumbilical incision was made and a Veress  needle placed  in the abdomen.  Opening closing pressures were 10-15 mmHg.  Needled  released.  Trocar placed in same incision.  Laparoscope placed in trocar  sleeve.  Two 5-mm ports were made in the left lower quadrant and left  hypogastric regions respectively under direct visualization.  The  ureters were once again identified throughout the pelvic course.  The  infundibulopelvic ligaments on either side were bipolar cauterized and  cut up to and including the round ligaments.  Bleeding points  hemostatically checked, and the procedure was continued vaginally.  Double-toothed tenaculum placed on the anterior and posterior lips of  the cervix.  Marcaine and epinephrine was injected circumferentially  around the cervix for a paracervical block.  Two cm of anterior and  posterior vaginal epithelium were incised transversely.  Peritoneal  reflections identified and opened without injury to their respective  organs.  Uterosacral cardinal ligament complexes clamped, cut and  ligated with 0 Vicryl suture.  This extended to the uterine vasculature,  ascending branches and broad ligaments.  Uterus, cervix, and right and  left tubes and ovaries were then removed.  Bleeding points  hemostatically checked.  Blood clots removed, irrigate utilized.  Closure  of the cuff in 1 layer with 0 Vicryl running interlocking  suture.   Tension-free vaginal tape procedure continued.  The anterior vaginal  epithelium was incised transversely.  The pubovesical cervical fascia  was then dissected off the vaginal epithelium until the space of Retzius  was identified but not entered.  Using an advantage bottom up TVT  system, the tapes were then placed and emanated from the skin 1 cm  lateral to the symphysis pubis midline.  Immediate cystoscopy followed.  No injury to urethra, trigone, lateral walls, dome.  Then, 400 mL of  normal saline was placed in the bladder.  Following this, about 100  to125 mL were  removed, Foley clamped and the TVT tapes were then  appropriately tensioned, and the plastic sheaths removed.  Following  this, the polypropylene tape was anchored on either side with 3-0 Vicryl  suture to avoid posterior migration.  More irrigation utilized.  Closure  of the vaginal epithelium with 2-0 Monocryl running interlocking suture.  The laparoscopy followed.  No evidence of bleeding noted in the pelvis.  Irrigant used and removed.  Afterwards, gas released, trocars removed.  Closure of the 10-mm fascia site with 0 Vicryl suture and 4-0 Vicryl for  subcuticular closure.  The tapes emanating from the skin were cut and  Dermabond used for the skin sites.  The patient returned to the post-  anesthesia recovery room in excellent condition.  Instrument and sponge  count were correct.      Ginger Carne, MD  Electronically Signed     SHB/MEDQ  D:  03/06/2007  T:  03/06/2007  Job:  [id number]

## 2011-03-30 NOTE — Discharge Summary (Signed)
NAME:  [name], [name]                ACCOUNT NO.:  [id number]   MEDICAL RECORD NO.:  [id number]          PATIENT TYPE:  OIB   LOCATION:  9313                          FACILITY:  [facility]   PHYSICIAN:  Tracy L. Mayford Knife, M.D.DATE OF BIRTH:  [birth year]   DATE OF ADMISSION:  03/06/2007  DATE OF DISCHARGE:  03/08/2007                               DISCHARGE SUMMARY   ADMISSION DIAGNOSES:  1. Chronic pelvic pain.  2. Genuine urinary stress incontinence.   DISCHARGE DIAGNOSES:  1. Status post laparoscopic-assisted vaginal hysterectomy.  2. Bilateral salpingo-oophorectomy.  3. Tension-free vaginal tape procedure.  4. Cystoscopy.  5. Chronic pelvic pain.  6. Genuine stress incontinence.  7. Stage I endometriosis.  8. Bipolar disorder.  9. Mood disorder, not otherwise specified.   LABORATORY DATA:  Admission hemoglobin 14.8, hematocrit 42.1, creatinine  0.75.  Postoperative labs: Hemoglobin 12.2, hematocrit 35.3, platelets  215,000, creatinine 0.68,   HOSPITAL COURSE:  A 40 year old gravida 2, para 2-0-0-2, presented for  scheduled laparoscopic and vaginal hysterectomy, bilateral salpingo-  oophorectomy, TVT, and cystoscopy genuine stress incontinence, chronic  pelvic pain and menometrorrhagia.  Please see history and physical for  full details.  Procedure was done without difficulty.  It was noted that  she had stage I endometriosis.  Please see op note for full details.   Postoperative course was remarkable for the patient becoming very  belligerent when she was to be discharged on postoperative day #1.  Apparently, she stated that she thought she could stay two days and got  very upset.  Social work and psychiatry were consulted.  Dr. Jeanie Sewer  described this as a reactive symptom, but had resolution with pain  control.  He felt that she was stable for outpatient management.  He  noted in the past she has been intolerant of many psychotropic regimens,  but seems to be tolerating her  current one which consists of Seroquel 25  mg at bedtime, Klonopin not to exceed 1 mg t.i.d. as antianxiety agent  and mid stability adjunct, and Xanax half a tablet to one tablet q.8h.  p.r.n. for anxiety, trazodone 25 mg to 50 mg at bedtime for insomnia.  She is also to follow up with the psychiatric clinic.   On postoperative day #2, the patient was ambulating, voiding and  tolerating p.o. without difficulty.  She was felt to be stable for  discharge which she agreed with.   DISPOSITION:  Home.   FOLLOW UP:  In four weeks with Dr. Mia Creek and with the psychiatric  clinic at [facility], [place], or [facility].   DISCHARGE MEDICATIONS:  1. Percocet 5 mg one to two p.o. q.4-6h.  2. Climara patch 0.025 mg patch, apply weekly.  (Dr. Shawnie Pons recommended      that she be on this because of her young age.  I did discuss the      fact that she has endometriosis so she may be intolerant to this      medication).  3. Xanax 1 mg p.o. q.8h. p.r.n.  4. Synthroid 175 mcg daily.  5. Clonazepam 1  mg up to two t.i.d.  6. Advair one puff b.i.d.  7. Albuterol inhaler two puffs t.i.d. p.r.n.  8. Trazodone 50 mg p.o. at bedtime.  9. Seroquel 25 mg daily.   DIET:  Regular.   ACTIVITY:  Nothing per vagina.  No heavy lifting for six weeks.           ______________________________  Marc Morgans Mayford Knife, M.D.     TLW/MEDQ  D:  03/08/2007  T:  03/08/2007  Job:  [id number]

## 2011-03-30 NOTE — Discharge Summary (Signed)
NAME:  [name], [name]                          ACCOUNT NO.:  [id number]   MEDICAL RECORD NO.:  [id number]                   PATIENT TYPE:  IPS   LOCATION:  0306                                 [facility]ILITY:  [facility]   PHYSICIAN:  Geoffery Lyons, M.D.                   DATE OF BIRTH:  08/28/1971   DATE OF ADMISSION:  02/16/2003  DATE OF DISCHARGE:  02/20/2003                                 DISCHARGE SUMMARY   INTRODUCTION:  The patient was a 40 year old woman.   INITIAL ASSESSMENT AND DIAGNOSIS:  The patient was recently discharged from  this hospital four days prior to her readmission.  She was readmitted  because she was not taking her medications right, she said.  She relapsed  and started doing crack cocaine again.  She was angry at her fiance and mad  at herself and wanted to jump from a car that was moving.  Consequently, she  had herself readmitted to the hospital.   MENTAL STATUS EXAM:  At the time of this admission revealed an alert,  oriented woman who was cooperative with good eye contact.  She appeared to  be anxious.  She was hyperverbal.  Thoughts were logical.  There was a  questionable delusion in that she thought her boyfriend was seeing another  woman, even though he apparently he does not and she knows that, reportedly.  Judgment and insight were limited.  Impulse control seemed poor.   PHYSICAL EXAMINATION:  Noncontributory.   ADMISSION DIAGNOSES:   AXIS I:  1. Bipolar disorder.  2. Cocaine abuse.   AXIS II:  Deferred.   AXIS III:  Graves' disease.   AXIS IV:  Moderate to severe.   AXIS V:  30/65.   FINDINGS:  All indicated laboratory examinations were within normal limits  and noncontributory except for her urine drug screen, which was positive for  both cocaine and marijuana.   HOSPITAL COURSE:  While in the hospital, the patient was not interested in  participating in groups or other treatment activities.  She simply wanted  her medications reinstituted  in the correct way and then she wanted to be on  her way.  Consequently, that is what happened.  Dr. Lourdes Sledge was on the day  of discharge but it was planned for that day.  She denied any suicidal  thoughts while she was in the hospital.  She had no difficulty stopping the  cocaine use once again.  She indicated she planned to follow her medicine  regimen correctly this time and to stay away from substances.  She perhaps  would move away from her boyfriend and stay with her mother for a while but  she was not sure about that.    FINAL DIAGNOSES:   AXIS I:  1. Bipolar disorder not otherwise specified.  2. Cocaine and marijuana abuse.   AXIS II:  Deferred.  AXIS III:  Graves' disease by history.   AXIS IV:  Moderate.   AXIS V:  50/65.   DISCHARGE MEDICATIONS:  1. Seroquel 50 mg twice a day as needed and 200 mg at bedtime.  2. Clonazepam 0.5 mg three times a day.  3. Advair 250\50 1 puff twice daily.  4. Synthroid 137 mcg daily.  5. Celexa 20 mg daily.  6. Depakote ER 750 mg in the morning and 1000 mg at bedtime.  7. Neurontin 600 mg three times daily.  8. Trazodone 50 mg at bedtime as needed.   ACTIVITY/DIET:  There were no restrictions placed on her activity or her  diet.   FOLLOW UP:  Her Depakote level was 87.7 on the 7th of April.  She is to  follow up with the [facility].     Carolanne Grumbling, M.D.                       Geoffery Lyons, M.D.    GT/MEDQ  D:  03/04/2003  T:  03/08/2003  Job:  [id number]

## 2011-03-30 NOTE — H&P (Signed)
NAME:  [name], ERISMAN                          ACCOUNT NO.:  [id number]   MEDICAL RECORD NO.:  [id number]                   PATIENT TYPE:  IPS   LOCATION:  0306                                 [facility]   PHYSICIAN:  Geoffery Lyons, M.D.                   DATE OF BIRTH:  [birth year]   DATE OF ADMISSION:  02/16/2003  DATE OF DISCHARGE:  02/20/2003                         PSYCHIATRIC ADMISSION ASSESSMENT   IDENTIFYING INFORMATION:  This is a 40 year old single white female  voluntarily admitted on February 16, 2003.   HISTORY OF PRESENT ILLNESS:  The patient presents with a history of bipolar  disorder.  Reports that she volunteered to come back after recently being  discharged from [facility].  She states she is not taking her  medications right.  She states it is the right medicine but that she missed  a day.  She relapsed doing crack cocaine.  She also got angry at her fiance  and mad at herself and wanted to jump out of the car because she was  noncompliant with her medicine.  She states that she needs help in keeping  her medicines straight.  Reporting that her mood swings are less and that  she is less angry and irritable since she has been on her medication.  The  patient continues to have delusional thoughts that her boyfriend wants  another woman.  She denies any current suicidal or homicidal ideation or  psychotic symptoms.   PAST PSYCHIATRIC HISTORY:  Third visit to [facility].  Was  recently discharged four days for impulsive behavior and irritability.  Has  a history of cutting and wanting to jump from her car.   SOCIAL HISTORY:  This is a 40 year old single white female with two  children.  No legal charges.   FAMILY HISTORY:  Depression on her mother's side.   ALCOHOL/DRUG HISTORY:  Smokes three packs per day.  Denies any alcohol use.  Reports a recent use of crack cocaine.   PRIMARY CARE PHYSICIAN:  Darl Pikes __________  at [facility].   MEDICAL PROBLEMS:  Hypothyroidism.   MEDICATIONS:  Synthroid 137 mcg, Celexa 20 mg q.d., Neurontin 200 mg t.i.d.,  albuterol inhaler, Seroquel 100 mg, 1/2 in the morning, 1 at 3 p.m., 100 mg  q.h.s., Depakote ER 500 mg in the morning, 250 mg at 3 p.m., 1000 mg q.h.s.,  Klonopin t.i.d.   ALLERGIES:  No known allergies.   PHYSICAL EXAMINATION:  The patient is a well-nourished female in no acute  distress.  Denies any complaints.  Vital signs are temperature 97.1, heart  rate 84, respirations 20, blood pressure 110/69.  She is 152 pounds.   MENTAL STATUS EXAM:  She is an alert, young middle-aged female.  Cooperative  with good eye contact.  Speech is hyperverbal and very repetitive comments  about why she is here and what happened to  her.  Her mood is anxious.  Her  affect is anxious.  Thought processes are positive delusions.  Her judgment  and insight are limited.  Poor impulse control.  Cognitive function is  intact.  Memory is intact.    DIAGNOSES:   AXIS I:  1. Bipolar disorder.  2. Cocaine abuse.   AXIS II:  Deferred.   AXIS III:  Graves disease.   AXIS IV:  Moderate to severe problems.   AXIS V:  Current 30; this past year 52.   PLAN:  Voluntary admission for impulsive behavior.  Delusional thinking.  Contract for safety.  Check every 15 minutes.  Resume her medications.  Will  resume her medications.  Will check her Depakote level.  Medication  compliance was reinforced.  We discussed drug influence on behaviors.  Plan  is to stabilize mood and thinking so patient can be safe.  The patient to be  medication-compliant and to follow up with mental health and to remain drug-  free.   TENTATIVE LENGTH OF STAY:  Three to four days.     Landry Corporal, N.P.                       Geoffery Lyons, M.D.    JO/MEDQ  D:  02/20/2003  T:  02/21/2003  Job:  [id number]

## 2011-03-30 NOTE — H&P (Signed)
NAME:  [name], [name] NO.:  [id number]   MEDICAL RECORD NO.:  [id number]          PATIENT TYPE:  AMB   LOCATION:  SDC                           [facility]ACILITY:  [facility]   PHYSICIAN:  Ginger Carne, MD  DATE OF BIRTH:  04-24-1971   DATE OF ADMISSION:  DATE OF DISCHARGE:                              HISTORY & PHYSICAL   DATE OF SURGERY:  Patient is scheduled for surgery on March 06, 2007.   REASON FOR ADMISSION:  Genuine urinary stress incontinence, chronic  pelvic pain and menometrorrhagia.   HISTORY OF PRESENT ILLNESS:  This patient is a 40 year old gravida 2,  para 2-0-0-2 female who is scheduled for a laparoscopic-assisted vaginal  hysterectomy, bilateral salpingo-oophorectomy, possible appendectomy  with tension-free vaginal tape procedure and cystoscopy for the  aforementioned diagnoses.  The patient has had a longstanding history  for years of worsening lower abdominal discomfort.  She was evaluated  for interstitial cystitis, has been on  Elmiron, however, her symptoms  are unrelated to said findings.  The patient bleeds 25 out of 30 days in  a month with heavy clotting.  Her hemoglobin is 15.  Her transvaginal  ultrasound reveals no evidence for polyps or abnormal intrauterine or  myometrial growths.  She takes no medications to enhance her bleeding  propensity and has no personal family history of bleeding diatheses.  The patient states that she has dyspareunia and dysmenorrhea and pain  between menses.  She has no gastrointestinal, genitourinary or  musculoskeletal sources that would match her specific symptoms.  The  patient states that Elmiron has helped minimally with her pain and  discontinued it.   OBSTETRICAL/GYN HISTORY:  The patient has had two vaginal deliveries in  the past.   CURRENT MEDICATIONS:  1. Synthroid 0.175 mg daily.  2. Clonazepam 10 mg, three times a day.  3. Seroquel 25 mg daily.  4. Advair inhaler 1 puff twice a day.  5.  Albuterol inhaler taken as needed.  6. Nebulizer twice a day as needed.  7. Trazodone 50 mg p.o. p.r.n. h.s.   ALLERGIES:  None to medication, food, iodine or latex.   MEDICAL HISTORY:  Grave's disease, bipolar depression, asthma.   SURGICAL HISTORY:  Laparoscopic bilateral tubal ligation performed in  October, 2004.  At that time there was no evidence for abnormalities  with the report of normal pelvic anatomy, no ovarian cysts, fibroids or  adhesions.  She has had foot surgery as well.   FAMILY HISTORY:  Mother died of COPD but no first-degree relatives with  breast, colon, ovarian or uterine carcinoma.   SOCIAL HISTORY:  The patient smokes one-half packs of cigarettes daily.  Denies alcohol or illicit drug abuse.   REVIEW OF SYSTEMS:  A 14 point comprehensive review of systems is within  normal limits.   PHYSICAL EXAMINATION:  VITAL SIGNS:  On physical exam, height 5 feet 0  inches, weight 150 pounds, blood pressure 114/87, pulse 95 and regular,  temperature 98.6.  HEENT: Grossly normal.  BREAST:  Breast exam without mass, discharge, thickenings or tenderness.  CHEST:  Clear to percussion, auscultation.  CARDIOVASCULAR:  Without murmurs or enlargements.  Regular rate and  rhythm.  EXTREMITIES/LYMPHATICS/SKIN/NEUROLOGICAL/MUSCULOSKELETAL:  Systems  within normal limits.  ABDOMEN:  Soft without gross hepatosplenomegaly.  PELVIC:  Pelvic exam normal Pap smear.  External Genitalia: Vulva and  vagina normal.  Cervix smooth without erosions or lesions.  Uterus  small, anteverted and flexed and tender on motion.  Both adnexa palpable  found to be normal.   LABORATORY DATA:  Labs GC and Chlamydia cultures negative.  Urinalysis  negative.  Residual urine volume 14 mL.  The patient demonstrates a  positive cough stress test with visible urine.   IMPRESSION:  Genuine urinary stress incontinence with interstitial  cystitis, chronic pelvic pain syndrome and menorrhagia.   PLAN:  It  is difficult to assess the symptomatology and determine what  factors contribute to her interstitial cystitis pain and other related  to possible endometriosis or chronic pelvic pain syndrome.  There is  overlap between her depression as well.  However, due to the patient's  menorrhagia, it was determined that in addition to the aforementioned,  it would be appropriate to proceed with definitive surgery.  The patient  was offered oral contraceptives on a continual basis and declined;  apparently she states in the past she has had problems with said  medication including worsening of her depression.  An IUD (Mirena) was  offered and declined.  Her expectation is to be relatively pain-free,  although she understands that said surgery may not accomplish this.  However, she will be satisfied by not having continual vaginal bleeding.   The nature of laparoscopic-assisted vaginal hysterectomy and bilateral  salpingo-oophorectomy were discussed in detail.  Risks including  possible injuries to ureter, bowel, bladder, possible conversion to an  open procedure, hemorrhage, possibly requiring blood transfusion,  infection and other unforeseen complications discussed and understood by  said patient.  Estrogen replacement therapy was also discussed as well.   Her genuine urinary stress incontinence is of type 1 without evidence of  an overactive bladder symptomatology.  She has never had urological  surgery, has no evidence of fecal incontinence and has no neurological  diseases or takes medications that would enhance her propensity to lose  urine.  She has attempted Kegel's in the past without benefit.  She  declined physical therapy, behavioral modification.  Ashby Dawes of said  procedure discussed in detail.  Risks including possible recurrent  urinary stress incontinence, urinary retention, urgency, possible graft erosion, rejection or infection were discussed and understood by said  patient.   Vaginal bleeding postoperatively was also discussed including  possibility of needing a Foley catheter on discharge.  All questions  answered to the satisfaction of said patient and the patient verbalized  understanding of same.     Ginger Carne, MD  Electronically Signed    SHB/MEDQ  D:  03/05/2007  T:  03/05/2007  Job:  [id number]

## 2011-03-30 NOTE — Consult Note (Signed)
NAME:  [name], [name] NO.:  [id number]   MEDICAL RECORD NO.:  [id number]          PATIENT TYPE:  OIB   LOCATION:  9313                          FACILITY:  [facility]   PHYSICIAN:  Antonietta Breach, M.D.  DATE OF BIRTH:  [birth year]   DATE OF CONSULTATION:  DATE OF DISCHARGE:  03/08/2007                                 CONSULTATION   REASON FOR CONSULTATION:  Medication management of bipolar disorder.   HISTORY OF PRESENT ILLNESS:  [name] is a 40 year old female  who was admitted to the [facility] on the 24th of  April 2008 due to chronic pelvic pain and urinary stress incontinence as  well as menometrorrhagia.   The patient underwent a laparoscopic-assisted vaginal hysterectomy on  the 24th of April.  She had bilateral salpingo-oophorectomy as well.  She had a tension-free vaginal tape procedure as well as a cystoscopy.   On the morning of the 25th of April the patient displayed some mood  lability.  She had a disagreement with her attending physicians'  discussion of her conditions and medication regimen.   Since that time the patient has returned to a calm state.  Her mood is  within normal limits.  She has no hallucinations or delusions.  She has  a constructive outlook of the future as well as normal interest.  She  denies any adverse psychotropic effects.  She reports that the  psychosocial processing with staff today was helpful, and she appears to  have resolved any disagreements with the medical staff.   PAST PSYCHIATRIC HISTORY:  [name] has suffered periods of severe mood  states.  She does have a history of having periods of increased energy  and decreased sleep without the presence of stimulants, although she  also has a history of cocaine use in the past.   She was admitted to the inpatient psychiatric unit of [facility] in April of 2004.  At that time she had not been  taking her psychotropic  medications correctly, and she had relapsed on  cocaine.  She had been having thoughts of jumping out in front of a car.  The record at that time reports some delusional jealousy involving her  boyfriend.   The patient has a history of self-cutting.  She also has a history of  two other psychiatric admissions in the past.  She notes that after she  moved away from her previous place of residence and stopped associating  with the drug-using group of people she was able to achieve abstinence  as well as mood stability.   Past psychotropic trials include Depakote.  The patient states that the  Depakote was intolerable, she felt a general feeling of malaise on it.  She also has been tried on Celexa.   Eventually the patient was stabilized on a combination of Klonopin and  Seroquel.  Eventually her maintenance dosage of Seroquel has been  decreased down to 25 mg q.h.s.  Also, she has required a continued  dosing of Klonopin 1 mg t.i.d. for antianxiety as well as  mood  stabilization augmentation.  She requires trazodone intermittently at 25-  50 mg q.h.s. for insomnia.  She also is maintained on Synthroid for  hypothyroidism.   FAMILY PSYCHIATRIC HISTORY:  Depression in the mother.   SOCIAL HISTORY:  Two children.  Marital status:  Single, but long-term  boyfriend.  Occupation:  Medically disabled.   GENERAL MEDICAL PROBLEMS:  Please see above.  Patient has a history of  Graves' disease and asthma.   MEDICATIONS:  MAR is reviewed.  Psychotropics include Xanax 1 mg q.6h.  p.r.n., Klonopin 1 mg q.8h., trazodone 50 mg q.h.s. p.r.n.   ALLERGIES:  CODEINE.   LABORATORY DATA:  WBC 10.3, hemoglobin 12.2, platelet count 215.  BUN 3,  creatinine 0.68, calcium 8.4.  TSH 3.447.  Serum HCG is negative.   REVIEW OF SYSTEMS:  CONSTITUTIONAL:  Afebrile.  HEAD:  No trauma.  EYES:  No visual changes.  EARS:  No hearing impairment.  NOSE:  No rhinorrhea.  MOUTH/THROAT:  No sore throat.  NEUROLOGIC:   Unremarkable.  PSYCHIATRIC:  As above.  CARDIOVASCULAR:  No chest pain, palpitations or edema.  RESPIRATORY:  Unremarkable.  GASTROINTESTINAL:  No vomiting.  GENITOURINARY:  As above.  SKIN:  Unremarkable.  HEMATOLOGIC/LYMPHATIC:  Unremarkable.  MUSCULOSKELETAL:  Unremarkable.  ENDOCRINE/METABOLIC:  As  above.   PHYSICAL EXAMINATION:  VITAL SIGNS:  Temperature 96.8, pulse 88,  respirations 20, blood pressure 123/79.   MENTAL STATUS EXAMINATION:  [name] is a female appearing her  chronologic age of 78, sitting up in her hospital bed, socially  appropriate and alert, with good eye contact.  Her affect is mildly  constricted at baseline, but then broadens with an appropriate range and  response.  Her mood is within normal limits.  She is oriented to all  spheres.  Her memory is intact to immediate, recent and remote.  Her  fund of knowledge and intelligence are within normal limits.  Her speech  involves normal rate and prosody.  Thought process is logical, coherent  and goal directed.  Thought content:  No thoughts of harming herself.  No thoughts of harming others.  No delusions.  No hallucinations.  Concentration is within normal limits.  Insight is intact.  Judgment is  intact.  The patient is cooperative with the interview.   ASSESSMENT:  AXIS I:  1. 293.84 anxiety disorder, not otherwise specified, stable.  2. 296.80 bipolar disorder, not otherwise specified, stable.  3. History of polysubstance abuse.  AXIS II:  Deferred.  AXIS III:  See general medical problems.  AXIS IV:  General medical.  AXIS V:  55.   [name] is not at risk to harm herself or others.  She agrees to use  emergency services immediately for any thoughts of harming herself,  thoughts of harming others, or distress.   The undersigned provided ego supportive psychotherapy and education.   The indications, alternatives and adverse effects of the following were discussed with the patient:  Seroquel  for anti-mania and mood  stabilization including the fact that it is not a first-line mood  stabilizer for prevention and including the facts that it can cause  hyperglycemia as well as a nonreversible movement disorder; Klonopin for  antianxiety as well as a mood stabilizing adjunct including the risk of  dependence and driving while drowsy; Xanax for anti acute anxiety  including the risk of dependence and adverse effects, particularly in  Combivent with Klonopin; trazodone in the off-label use of anti-  insomnia.  The patient emphasizes that when she has panic breakthrough, only the  Xanax is able to eliminate it.  She has been judicious in her use of  Xanax and has not experienced any adverse effects of her benzodiazepine  use.   The patient again emphasizes that she has arrived at this medication  regimen after inadequate efficacy and adverse effects with previous  regimens.  She understands the above discussion and wants to continue  this regimen.  She agrees to report any adverse effects to her  prescribing physician.   PLAN:  1. The patient will continue her Klonopin, not to exceed 1 mg t.i.d.,      for anti-anxiety as well as a mood-stabilizing adjunct.  2. She will continue her Seroquel maintenance for anti-mania and mood      stabilization at 25 mg q.h.s., which has been the lowest dose      successful in maintenance.  3. She will continue her Xanax 0.5 to 1 mg q.8h. p.r.n. severe anxiety      or panic.  4. She will continue trazodone at 25-50 mg q.h.s. for insomnia.   Regarding outpatient followup, she has a current [facility].  However, if  needed, psychiatric clinics available at [facility], [facility], and [facility].      Antonietta Breach, M.D.  Electronically Signed     JW/MEDQ  D:  03/08/2007  T:  03/08/2007  Job:  [id number]

## 2011-04-12 ENCOUNTER — Other Ambulatory Visit: Payer: Self-pay | Admitting: Family Medicine

## 2011-04-12 DIAGNOSIS — F419 Anxiety disorder, unspecified: Secondary | ICD-10-CM

## 2011-04-12 MED ORDER — CLONAZEPAM 1 MG PO TABS: 1.0000 mg | ORAL_TABLET | Freq: Three times a day (TID) | ORAL | Status: DC

## 2011-04-12 NOTE — Telephone Encounter (Signed)
Pt requesting refill on klonopin.  

## 2011-04-12 NOTE — Telephone Encounter (Signed)
Patient was called and must make an apt for additional refills.  I will fax this in as she had 3 teeth pulled today.

## 2011-05-08 ENCOUNTER — Ambulatory Visit (INDEPENDENT_AMBULATORY_CARE_PROVIDER_SITE_OTHER): Payer: Medicaid Other | Admitting: Family Medicine

## 2011-05-08 ENCOUNTER — Encounter: Payer: Self-pay | Admitting: Family Medicine

## 2011-05-08 VITALS — BP 117/78 | HR 83 | Ht 60.0 in | Wt 144.7 lb

## 2011-05-08 DIAGNOSIS — F419 Anxiety disorder, unspecified: Secondary | ICD-10-CM

## 2011-05-08 DIAGNOSIS — F411 Generalized anxiety disorder: Secondary | ICD-10-CM

## 2011-05-08 DIAGNOSIS — E039 Hypothyroidism, unspecified: Secondary | ICD-10-CM

## 2011-05-08 DIAGNOSIS — J309 Allergic rhinitis, unspecified: Secondary | ICD-10-CM | POA: Insufficient documentation

## 2011-05-08 DIAGNOSIS — F17200 Nicotine dependence, unspecified, uncomplicated: Secondary | ICD-10-CM

## 2011-05-08 LAB — TSH: TSH: 0.036 u[IU]/mL — ABNORMAL LOW (ref 0.350–4.500)

## 2011-05-08 MED ORDER — FLUTICASONE PROPIONATE 50 MCG/ACT NA SUSP: 1.0000 | Freq: Every day | NASAL | Status: DC

## 2011-05-08 MED ORDER — CLONAZEPAM 1 MG PO TABS: 1.0000 mg | ORAL_TABLET | Freq: Three times a day (TID) | ORAL | Status: DC

## 2011-05-08 MED ORDER — VARENICLINE TARTRATE 1 MG PO TABS: 1.0000 mg | ORAL_TABLET | Freq: Two times a day (BID) | ORAL | Status: AC

## 2011-05-08 NOTE — Assessment & Plan Note (Signed)
Well controlled on high dose clonazepam, must return every 3 months per contract.

## 2011-05-08 NOTE — Assessment & Plan Note (Signed)
One month of Chantix, she will call if she goes this route and needs the next two month treatment

## 2011-05-08 NOTE — Patient Instructions (Signed)
Do rowing exercises For your husband: pick up a new patient packet, and info hospital certification through [facility] Continue Trazodone at bedtime

## 2011-05-08 NOTE — Assessment & Plan Note (Signed)
Add nasal steroid. 

## 2011-05-08 NOTE — Progress Notes (Signed)
  Subjective:    Patient ID: [name], female    DOB: Jul 09, 1971, 40 y.o.   MRN: [id number]  HPI Required visit for refill on controlled substance, patient reports clonazepam keeps her mood stable.  She stated " I do not have the bipolar moments".  She stopped the trazodone as someone in the neighborhood died using them.  She would like to switch to Ambien.  We discussed that Ambien and clonazepam are similar and should not be used together, she understood.  She denies any alcohol.  She has a rash over her arms and chest, she has obvious sun overexposure.  She does not use SPF. She would like to quit smoking and would like to try Chantix again.  She tried once and did not have a worsening of her mania.  She has burning in her upper back on the left.  She is walking and exercising in the pool.    Allergic nasal drainage after being outside for any length of time.  Review of Systems  HENT: Positive for rhinorrhea, sneezing and postnasal drip.   Respiratory: Positive for cough. Negative for wheezing.   Musculoskeletal: Positive for arthralgias.       Complains of knee pain  Psychiatric/Behavioral: The patient is nervous/anxious and is hyperactive.        Objective:   Physical Exam  Constitutional: She appears well-developed and well-nourished.  HENT:  Nose: Nose normal.  Mouth/Throat: Oropharynx is clear and moist.       Few bottom teeth, upper plate  Cardiovascular: Normal rate and regular rhythm.   Pulmonary/Chest: Effort normal and breath sounds normal.  Musculoskeletal: Normal range of motion. She exhibits no edema and no tenderness.  Skin:       Very dark tan, skin dry, multiple papules on chest and upper arms consistent with sun damage.          Assessment & Plan:

## 2011-05-08 NOTE — Assessment & Plan Note (Signed)
Check TSH 

## 2011-05-09 ENCOUNTER — Other Ambulatory Visit: Payer: Self-pay | Admitting: Family Medicine

## 2011-05-09 MED ORDER — LEVOTHYROXINE SODIUM 137 MCG PO TABS: 137.0000 ug | ORAL_TABLET | Freq: Every day | ORAL | Status: DC

## 2011-05-09 NOTE — Progress Notes (Signed)
Over-replaced will decrease dosage to 137 mcg, recheck in 3 months.  Called patient and discussed with her.

## 2011-05-21 ENCOUNTER — Telehealth: Payer: Self-pay | Admitting: Family Medicine

## 2011-05-21 NOTE — Telephone Encounter (Signed)
Left message.  Doubt that a small change in dosage in a few weeks would cause fatigue.  Patient was over replaced and the risks of such are high.  She may be dealing with another issue and recommended that she come in to discuss if it does not resolve.

## 2011-05-21 NOTE — Telephone Encounter (Signed)
Pt needing to speak with MD re: her synthroid meds, says its not helping, has no energy.

## 2011-05-21 NOTE — Telephone Encounter (Signed)
Called pt. Pt reports, that she has been on synthroid 137 mcg, it was changed from 150 mcg. Since changing the dose, she feels very sluggish and wants S.Saxon to know. Pt said, that we can call her back with the answer and leave a message on her phone. Fwd. To S.Saxon for advise. Lorenda Hatchet, [name]

## 2011-06-11 ENCOUNTER — Telehealth: Payer: Self-pay | Admitting: Family Medicine

## 2011-06-11 NOTE — Telephone Encounter (Signed)
[name] just wants [name] to know that she has an appt with Dr. Earnest Bailey on Wednesday to have the Thyroid Level checked.

## 2011-06-11 NOTE — Telephone Encounter (Signed)
Called pt and advised. She agreed. Lorenda Hatchet, [name]

## 2011-06-11 NOTE — Telephone Encounter (Signed)
Please ask patient to come in and will need to discuss and get labs.

## 2011-06-11 NOTE — Telephone Encounter (Signed)
Her Synthroid had been change to a lower does and now she is feeling really bad.  Wants to know if she can go back to the 150mg .  pls advise

## 2011-06-11 NOTE — Telephone Encounter (Signed)
Will fwd to PCP .[name]  

## 2011-06-12 NOTE — Telephone Encounter (Signed)
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  She thinks it keeps her weight down.  She is requesting to resume dose that pushed TSH very low. Has apt with Dr. Earnest Bailey tomorrow.

## 2011-06-13 ENCOUNTER — Encounter: Payer: Self-pay | Admitting: Family Medicine

## 2011-06-13 ENCOUNTER — Ambulatory Visit (INDEPENDENT_AMBULATORY_CARE_PROVIDER_SITE_OTHER): Payer: Medicaid Other | Admitting: Family Medicine

## 2011-06-13 VITALS — BP 133/90 | HR 58 | Temp 97.1°F | Ht 60.0 in | Wt 144.6 lb

## 2011-06-13 DIAGNOSIS — M25569 Pain in unspecified knee: Secondary | ICD-10-CM | POA: Insufficient documentation

## 2011-06-13 DIAGNOSIS — E039 Hypothyroidism, unspecified: Secondary | ICD-10-CM

## 2011-06-13 NOTE — Patient Instructions (Signed)
Your dose of thyroid will depend on what your blood tests show. It is harmful to give you more thyroid hormone than your body needs. I would encourage you to talk to your primary provider about other reasons for your fatigue.

## 2011-06-13 NOTE — Assessment & Plan Note (Signed)
Chronic right knee pain with mild effusion today.  Patient declines any further workup and states she will not take any medications.  Unclear what her motivation is.  Advised her to take acetaminophen and if she desires, to make follow-up with PCP for further evaluation.

## 2011-06-13 NOTE — Assessment & Plan Note (Signed)
Advised on risks of over replacement.  Will recheck TSH and free t4, told her replacement would depend on her lab results.  Urged her to consider other causes for her symptoms if lab results do not indicate need for higher dose.[name]

## 2011-06-13 NOTE — Progress Notes (Signed)
  Subjective:    Patient ID: [name], female    DOB: 05-Sep-1971, 40 y.o.   MRN: [id number]  HPI  here to discuss thyroid and right knee pain  Thyroid:  TSH was markedly low at last check, levothyroxine dose was decreased 4-5 weeks ago.  Patient states that since that time she has had fatigue, weight gain.  She would like increased dose.  She has been counseled in the past about adverse effects of over replacement.  She states she is not concerned of systemic effects or her bone health.  She does not believe there could be other causes for her fatigue.  Right knee:  6+ months of  Lateral right knee pain and swelling, no trauma or past injury.  Not taking any medication.  Some popping no locking.  Worse when active.  Walks 2 miles per day  Requests refills for clonazepam Review of Systems  see hpi    Objective:   Physical Exam GEN:  Agitated, states she had IC bladder treatment today CV: RRR RESP: CTAB Knee: Some mild suprapatellar effusion Normal to inspection with no erythema or obvious bony abnormalities. Palpation normal with no warmth or joint line tenderness or patellar tenderness or condyle tenderness. Normal range of motion.  No obvious laxity      Assessment & Plan:

## 2011-06-14 ENCOUNTER — Telehealth: Payer: Self-pay | Admitting: *Deleted

## 2011-06-14 NOTE — Telephone Encounter (Signed)
Pt called and said, that she is returning S.Saxon's call? [name], [name]

## 2011-06-14 NOTE — Telephone Encounter (Signed)
I spoke with her, we will wait for a full 8 week on this new dose to see if her TSH resets, if her TSH is still low will decrease to the next lower dose.  Confronted patient about using extra for weight loss.

## 2011-06-17 LAB — TSH: TSH: 0.086 u[IU]/mL — ABNORMAL LOW (ref 0.350–4.500)

## 2011-06-19 ENCOUNTER — Encounter: Payer: Self-pay | Admitting: Sports Medicine

## 2011-06-19 ENCOUNTER — Ambulatory Visit (INDEPENDENT_AMBULATORY_CARE_PROVIDER_SITE_OTHER): Payer: Medicaid Other | Admitting: Sports Medicine

## 2011-06-19 ENCOUNTER — Telehealth: Payer: Self-pay | Admitting: Family Medicine

## 2011-06-19 DIAGNOSIS — M25569 Pain in unspecified knee: Secondary | ICD-10-CM

## 2011-06-19 MED ORDER — TRAMADOL HCL 50 MG PO TABS: 50.0000 mg | ORAL_TABLET | Freq: Four times a day (QID) | ORAL | Status: AC | PRN

## 2011-06-19 MED ORDER — ACETAMINOPHEN ER 650 MG PO TBCR: 1300.0000 mg | EXTENDED_RELEASE_TABLET | Freq: Three times a day (TID) | ORAL | Status: AC | PRN

## 2011-06-19 NOTE — Patient Instructions (Signed)
Great to see you today. Aspirated and injected your knee. Please keep in ACE wrapped. Continue your acetaminophen, may take 1,000 mg 3x a day. Come back to see me in 2 weeks to see how your knee is doing.  [name]. [name], M.D. [name] [address], [place] [address] [phone]

## 2011-06-19 NOTE — Telephone Encounter (Signed)
Waiting for pt to call back. Left message. [name], [name]

## 2011-06-19 NOTE — Telephone Encounter (Signed)
Ms. Tangeman has some personal things she wants to talk to Darl Pikes about.

## 2011-06-19 NOTE — Progress Notes (Signed)
  Subjective:    Patient ID: [name], female    DOB: [birth year], 40 y.o.   MRN: [id number]  HPI Right knee pain: This is present for approximately 6 months, it is now painful to walk, with pain localized to the anterolateral joint line without radiation. She denies any history of trauma, and the pain came on gradually. And is currently no better with Tylenol. She notes the worst pain with walking, and when pivoting. She's also had some buckling. She notes occasional catches and clicks. The knee does develop swelling frequently.  Past medical history: Graves' disease, interstitial cystitis, asthma. Allergies: No known drug allergies. Family history, surgical history, social history: Were reviewed and no changes were needed. Review of Systems    denies fever, chills, night sweats, weight loss. Objective:   Physical Exam General: Well-developed, well-nourished, Caucasian female, who is currently tearful.  Musculoskeletal exam: Right Knee: There is a significant effusion present with a ballotable patella.. There is significant tenderness to palpation along the anterolateral joint line, I did not note any tenderness over the femoral condyles or any other structures. Range of motion is from 5 to 110. Ligaments with solid consistent endpoints including ACL, PCL, LCL, MCL. McMurray's test reproduces pain, but there are no clicks.. Non painful patellar compression. Patellar glide without crepitus. Patellar and quadriceps tendons unremarkable. Hamstring and quadriceps strength is normal.   Right knee aspiration and injection. Consent obtained and verified. Time-out conducted. Noted no overlying erythema, induration, or other signs of local infection. Sterile betadine prep. Furthur cleansed with alcohol. Topical analgesic spray: Ethyl chloride. Joint: Lateral suprapatellar pouch. The subcutaneous tissue was anesthetized with 5 cc of 1% lidocaine. An 18-gauge needle on a 60 cc syringe  was inserted into the suprapatellar pocket, and 30 cc of joint fluid that was clear was aspirated. The syringe was switched with the help of a hemostat to stabilize the needle and 1 cc of Kenalog 40 and 4 cc of lidocaine 1% were injected easily into the joint space. Completed without difficulty Advised to call if fevers/chills, erythema, induration, drainage, or persistent bleeding. We noted immediate and complete resolution of her pain. Ace wrap placed.  Musculoskeletal ultrasound did show a prominent suprapatellar pouch effusion, but no other significant derangement of the menisci, patellar tendon, LCL, MCL, or quadriceps tendons were seen. Images saved    Assessment & Plan:

## 2011-06-19 NOTE — Assessment & Plan Note (Signed)
Aspirated and injected as above. I did reexamine her knee after the aspiration and again noted stable anterior cruciate ligament, PCL, MCL and LCL as above. I do suspect internal derangement, however I did not see any injury to the menisci or other structures on ultrasound. This does not exclude a meniscal injury and her symptoms are quite suggestive. I would like to see her back in 2 weeks, to see how her symptoms are doing. When she comes back to see me we will start her on a rehabilitation program. Should she still be having mechanical symptoms then she would need to be evaluated for knee arthroscopy.

## 2011-06-25 ENCOUNTER — Telehealth: Payer: Self-pay | Admitting: Sports Medicine

## 2011-06-25 NOTE — Telephone Encounter (Signed)
Pt called [facility] re: injection.  She was significantly better on the day of service.  Not much better now.  I left a message on her machine letting her know that she can make an appt to see [place].  I would be glad to reassess the knee and offer further advice.  If her pain is back then I suspect she would need to go for knee arthroscopy as she did have sx suggestive of a meniscal injury.

## 2011-06-26 ENCOUNTER — Ambulatory Visit (INDEPENDENT_AMBULATORY_CARE_PROVIDER_SITE_OTHER): Payer: Medicaid Other | Admitting: Sports Medicine

## 2011-06-26 ENCOUNTER — Encounter: Payer: Self-pay | Admitting: Sports Medicine

## 2011-06-26 VITALS — BP 120/85 | HR 62 | Temp 97.8°F | Ht 60.0 in | Wt 144.0 lb

## 2011-06-26 DIAGNOSIS — M25569 Pain in unspecified knee: Secondary | ICD-10-CM

## 2011-06-26 NOTE — Progress Notes (Signed)
  Subjective:    Patient ID: [name], female    DOB: [birth year], 40 y.o.   MRN: [id number]  HPI  Right knee pain: This is present for approximately 6 months, it is now painful to walk, with pain localized to the anterolateral joint line without radiation. She denies any history of trauma, and the pain came on gradually. And is currently no better with Tylenol, tramadol, or NSAIDs. She notes the worst pain with walking, and when pivoting. She's also had some buckling. She notes occasional catches and clicks. The knee does develop swelling frequently. I saw her last week at which point I aspirated approximately 30 cc of clear fluid, and injected her with cortisone. The injection worked for about 3 days per the patient, and the tramadol makes her sick. She is currently asking for narcotics, and said that she would just go elsewhere when I informed her that we do not prescribe narcotics.  Review of Systems    denies fever, chills, night sweats, weight loss. Objective:   Physical Exam  General: Well-developed, well-nourished, Caucasian female, who is currently tearful.  Musculoskeletal exam: Right Knee: There is a mild effusion present There is significant tenderness to palpation along the anterolateral joint line, I did not note any tenderness over the femoral condyles or any other structures. Range of motion is from 0 to 110. Ligaments with solid consistent endpoints including ACL, PCL, LCL, MCL. McMurray's test reproduces pain, but there are no clicks.. Non painful patellar compression. Patellar glide without crepitus. Patellar and quadriceps tendons unremarkable. Hamstring and quadriceps strength is normal.      Assessment & Plan:

## 2011-06-26 NOTE — Progress Notes (Signed)
Addended by: Jacki Cones C on: 06/26/2011 05:38 PM   Modules accepted: Orders

## 2011-06-26 NOTE — Assessment & Plan Note (Signed)
No better status post aspiration and injection one week ago with recurrent effusion. Clear fluid at the last visit is not suggestive of infection, or crystalline arthropathy. It is very likely she has internal derangement, most likely a degenerative meniscal injury. Placed her in a hinged knee brace today, and we'll go ahead and get an MRI. She will followup with [place] after the MRI. I've informed her multiple times that we will not be prescribing narcotic from this office.

## 2011-06-26 NOTE — Patient Instructions (Signed)
Knee brace. MRI of her knee. Make an appointment to come back to see me to go over the results of the her MRI is done.  Ihor Austin. Benjamin Stain, M.D. [facility] [address] [place] [address] [phone]

## 2011-06-28 ENCOUNTER — Other Ambulatory Visit: Payer: Medicaid Other

## 2011-06-30 ENCOUNTER — Ambulatory Visit
Admission: RE | Admit: 2011-06-30 | Discharge: 2011-06-30 | Disposition: A | Discharge status: Home / Self Care | Payer: Medicaid Other | Source: Ambulatory Visit | Attending: Sports Medicine | Admitting: Sports Medicine

## 2011-06-30 DIAGNOSIS — M25569 Pain in unspecified knee: Secondary | ICD-10-CM

## 2011-07-03 ENCOUNTER — Ambulatory Visit (INDEPENDENT_AMBULATORY_CARE_PROVIDER_SITE_OTHER): Payer: Medicaid Other | Admitting: Family Medicine

## 2011-07-03 VITALS — BP 121/86

## 2011-07-03 DIAGNOSIS — M25569 Pain in unspecified knee: Secondary | ICD-10-CM

## 2011-07-03 NOTE — Progress Notes (Signed)
  Subjective:    Patient ID: [name], female    DOB: 1971/02/06, 40 y.o.   MRN: [id number]  HPI  Followup right knee pain. She's been wearing a brace during the day that seems to help quite a bit. She has difficulty sleeping in the brace for which she takes it off she has increasing throbbing and stiff sensation in any. Walking with a brace is better than without it but she still has pain with almost every step and long periods of walking increased swelling. She's had some numbness around the knee but no numbness in her foot or leg.  Review of Systems Denies fever, denies seeing any edema around the knee, denies paresthesias in the right leg. She's had no unusual weight gain or loss.    Objective:   Physical Exam  Well-developed female no acute distress KNEES: Right knee with small effusion. Ligaments intact. Positive McMurray. Distally neurovascularly intact. Review of MRI shows lateral and medial meniscal tears that appeared to be horizontal and probably degenerative and chronic. There is also an area of acute chondral damage over the lateral tibial plateau.      Assessment & Plan:   knee pain with chondral defect. We'll set her up for orthopedic evaluation with probable arthroscopy.

## 2011-07-03 NOTE — Progress Notes (Signed)
Pt scheduled for appt with Dr. Dion Saucier at [facility] for 07/04/11 at 2:30 pm.  Pt notified of appt info.

## 2011-07-13 ENCOUNTER — Other Ambulatory Visit: Payer: Self-pay | Admitting: Family Medicine

## 2011-07-13 DIAGNOSIS — F419 Anxiety disorder, unspecified: Secondary | ICD-10-CM

## 2011-07-13 NOTE — Telephone Encounter (Signed)
Will fwd. To S.Saxon .Slade, [place]  

## 2011-07-13 NOTE — Telephone Encounter (Signed)
Needs a refill on Clonazepam sent to her pharmacy.

## 2011-07-17 MED ORDER — CLONAZEPAM 1 MG PO TABS: 1.0000 mg | ORAL_TABLET | Freq: Three times a day (TID) | ORAL | Status: DC

## 2011-07-31 ENCOUNTER — Ambulatory Visit: Payer: Medicaid Other | Admitting: Family Medicine

## 2011-08-06 LAB — I-STAT 8, (EC8 V) (CONVERTED LAB)
BUN: 8
Glucose, Bld: 94
Potassium: 3.8
TCO2: 24
pH, Ven: 7.436 — ABNORMAL HIGH

## 2011-08-06 LAB — DIFFERENTIAL
Basophils Absolute: 0.1
Eosinophils Absolute: 0.1
Eosinophils Relative: 1

## 2011-08-06 LAB — POCT I-STAT CREATININE: Operator id: 294521

## 2011-08-06 LAB — CBC
HCT: 40.8
MCV: 88.9
Platelets: 274
RDW: 13.3

## 2011-08-10 ENCOUNTER — Encounter: Payer: Self-pay | Admitting: Family Medicine

## 2011-08-10 ENCOUNTER — Ambulatory Visit (INDEPENDENT_AMBULATORY_CARE_PROVIDER_SITE_OTHER): Payer: Medicaid Other | Admitting: Family Medicine

## 2011-08-10 VITALS — BP 120/80 | HR 85 | Temp 97.9°F

## 2011-08-10 DIAGNOSIS — R059 Cough, unspecified: Secondary | ICD-10-CM

## 2011-08-10 DIAGNOSIS — N3010 Interstitial cystitis (chronic) without hematuria: Secondary | ICD-10-CM

## 2011-08-10 DIAGNOSIS — F411 Generalized anxiety disorder: Secondary | ICD-10-CM

## 2011-08-10 DIAGNOSIS — J449 Chronic obstructive pulmonary disease, unspecified: Secondary | ICD-10-CM

## 2011-08-10 DIAGNOSIS — J309 Allergic rhinitis, unspecified: Secondary | ICD-10-CM

## 2011-08-10 DIAGNOSIS — R05 Cough: Secondary | ICD-10-CM

## 2011-08-10 DIAGNOSIS — R051 Acute cough: Secondary | ICD-10-CM | POA: Insufficient documentation

## 2011-08-10 DIAGNOSIS — F419 Anxiety disorder, unspecified: Secondary | ICD-10-CM

## 2011-08-10 DIAGNOSIS — J4489 Other specified chronic obstructive pulmonary disease: Secondary | ICD-10-CM

## 2011-08-10 DIAGNOSIS — F319 Bipolar disorder, unspecified: Secondary | ICD-10-CM

## 2011-08-10 DIAGNOSIS — Z23 Encounter for immunization: Secondary | ICD-10-CM

## 2011-08-10 DIAGNOSIS — M25569 Pain in unspecified knee: Secondary | ICD-10-CM

## 2011-08-10 MED ORDER — CLONAZEPAM 1 MG PO TABS: 1.0000 mg | ORAL_TABLET | Freq: Three times a day (TID) | ORAL | Status: DC

## 2011-08-10 MED ORDER — CLONAZEPAM 1 MG PO TABS: 1.0000 mg | ORAL_TABLET | Freq: Two times a day (BID) | ORAL | Status: DC | PRN

## 2011-08-10 MED ORDER — FLUTICASONE PROPIONATE 50 MCG/ACT NA SUSP: 1.0000 | Freq: Every day | NASAL | Status: DC

## 2011-08-10 MED ORDER — HYDROCODONE-HOMATROPINE 5-1.5 MG/5ML PO SYRP: 5.0000 mL | ORAL_SOLUTION | Freq: Four times a day (QID) | ORAL | Status: AC | PRN

## 2011-08-10 MED ORDER — BECLOMETHASONE DIPROPIONATE 40 MCG/ACT IN AERS: 2.0000 | INHALATION_SPRAY | Freq: Two times a day (BID) | RESPIRATORY_TRACT | Status: DC

## 2011-08-10 MED ORDER — HYDROCODONE-HOMATROPINE 5-1.5 MG/5ML PO SYRP: 5.0000 mL | ORAL_SOLUTION | Freq: Four times a day (QID) | ORAL | Status: DC | PRN

## 2011-08-10 NOTE — Patient Instructions (Signed)
Keep trying to quit smoking Return in 3 months to meet new doctor

## 2011-08-10 NOTE — Assessment & Plan Note (Signed)
Arthroscopic surgery for torn cartilage

## 2011-08-10 NOTE — Progress Notes (Signed)
  Subjective:    Patient ID: [name], female    DOB: September 22, 1971, 40 y.o.   MRN: [id number]  HPI  Recent knee surgery for multiple problems, is non weight bearing at this point.  Reviewed meds, has done well on tid clonazepam for mood issues, self discontinued Abilify but has it at home if she would get manic, she occasionally takes trazodone for sleep.  In general she feels more stable.  She is not happy with  Her long term relationship and the father of her children.   She continues to receive infusion for interstitial cystitis every 3 weeks at [facility].  She was over-replaced with thyroid hormone and when dosage was reduced she felt more lethargic, she still has a low TSH but now in the normal range.  Review of Systems     Objective:   Physical Exam        Assessment & Plan:

## 2011-09-12 ENCOUNTER — Telehealth: Payer: Self-pay | Admitting: Family Medicine

## 2011-09-12 DIAGNOSIS — F411 Generalized anxiety disorder: Secondary | ICD-10-CM

## 2011-09-12 DIAGNOSIS — F419 Anxiety disorder, unspecified: Secondary | ICD-10-CM

## 2011-09-12 MED ORDER — CLONAZEPAM 1 MG PO TABS: 1.0000 mg | ORAL_TABLET | Freq: Three times a day (TID) | ORAL | Status: DC

## 2011-09-12 NOTE — Telephone Encounter (Signed)
Need refill on clonazepam .  Call when ready

## 2011-09-12 NOTE — Telephone Encounter (Signed)
Rx called in and pt informed, appt made with Dr. Jennette Kettle for 11.14.2012.[name] [place]

## 2011-09-12 NOTE — Telephone Encounter (Signed)
Dear Cliffton Asters Team Please call in 2 m of clonazepam as below Please tell Benedicta that I would like to see her in next 2 months as I am taking over where Ms Humberto Seals left off THANKS! Denny Levy

## 2011-09-14 ENCOUNTER — Telehealth: Payer: Self-pay | Admitting: Family Medicine

## 2011-09-14 NOTE — Telephone Encounter (Signed)
[name] is calling because her Nebulizer is not working correctly and she would like to get a new one.  She is unsure exactly what she needs to do to get a new one with Medicaid.

## 2011-09-26 ENCOUNTER — Ambulatory Visit: Payer: Medicaid Other | Admitting: Family Medicine

## 2011-09-27 ENCOUNTER — Ambulatory Visit: Payer: Medicaid Other | Admitting: Family Medicine

## 2011-10-11 ENCOUNTER — Telehealth: Payer: Self-pay | Admitting: Family Medicine

## 2011-10-11 DIAGNOSIS — F411 Generalized anxiety disorder: Secondary | ICD-10-CM

## 2011-10-11 DIAGNOSIS — F419 Anxiety disorder, unspecified: Secondary | ICD-10-CM

## 2011-10-11 MED ORDER — CLONAZEPAM 1 MG PO TABS: 1.0000 mg | ORAL_TABLET | Freq: Three times a day (TID) | ORAL | Status: DC

## 2011-10-11 NOTE — Telephone Encounter (Signed)
Dear Cliffton Asters Team Please call this in and see what we need to do for her to get a new neb machine. THANKS! Denny Levy

## 2011-10-11 NOTE — Telephone Encounter (Signed)
[name] called to request refill on her Clonazepam and a rx to get a new nebulizer.  The old one is not functioning and she need an order to obtain a new one.  She has an appt that was resched on 12/12.

## 2011-10-12 NOTE — Telephone Encounter (Signed)
RX was called in and patient was notified about this and that she needs to come in for further refills.

## 2011-10-15 ENCOUNTER — Telehealth: Payer: Self-pay | Admitting: Family Medicine

## 2011-10-15 DIAGNOSIS — E039 Hypothyroidism, unspecified: Secondary | ICD-10-CM

## 2011-10-15 DIAGNOSIS — K279 Peptic ulcer, site unspecified, unspecified as acute or chronic, without hemorrhage or perforation: Secondary | ICD-10-CM

## 2011-10-15 DIAGNOSIS — J449 Chronic obstructive pulmonary disease, unspecified: Secondary | ICD-10-CM

## 2011-10-15 NOTE — Telephone Encounter (Signed)
We can call in a refill--I would continue current dose and we can check it on the 12th. She shoul NOT stop it until then so she needs to be on her current dose THANKS! [name]

## 2011-10-15 NOTE — Telephone Encounter (Signed)
Pt was told to continue taking the meds as prescribed and that when she comes in for her appt on 12.12.2012 Dr. Jennette Kettle will go over this with her and decide if her mg strength should be changed. She was told that she can go p/u her Rx at the pharmacy. Pt told me that it would be a waste of her money to go get it since she feels as if this mg strength is not helping.[name] [place]

## 2011-10-15 NOTE — Telephone Encounter (Signed)
States that she is out of her synthroid and thinks it's not strong enough.  Wants it increased to - has an appt on 12/12 but can't wait until then to get her meds.  Wants to know what she should do. [facility]

## 2011-10-16 NOTE — Telephone Encounter (Signed)
lvm and informed pt that Dr. Jennette Kettle is not okay with her self adjustment of her meds. She should continue the medication dosage as given and she would like for her to come in to have labs drawn. Asked that she call back and make an appt to have labs drawn.[name] [place]

## 2011-10-16 NOTE — Telephone Encounter (Signed)
Dear Cliffton Asters Team wellif she stops taking THIS dose hen the test I do on the 12th willNOT be accurate. Please tell her to continue this dose--if we make a change it will likely be a small change but I need accurate info. Tell her NOT totake additional pills as this willalso mess up the test----in fact, why don't I put in for a tsh and she can come get it done today and I vcan give her hat infor tomorrow or next day by phone---that is best option THANKS! Denny Levy

## 2011-10-24 ENCOUNTER — Telehealth: Payer: Self-pay | Admitting: Family Medicine

## 2011-10-24 ENCOUNTER — Ambulatory Visit (INDEPENDENT_AMBULATORY_CARE_PROVIDER_SITE_OTHER): Payer: Medicaid Other | Admitting: Family Medicine

## 2011-10-24 ENCOUNTER — Encounter: Payer: Self-pay | Admitting: Family Medicine

## 2011-10-24 VITALS — BP 121/82 | HR 81 | Temp 98.6°F | Ht 60.0 in | Wt 156.4 lb

## 2011-10-24 DIAGNOSIS — F419 Anxiety disorder, unspecified: Secondary | ICD-10-CM

## 2011-10-24 DIAGNOSIS — R05 Cough: Secondary | ICD-10-CM

## 2011-10-24 DIAGNOSIS — R5381 Other malaise: Secondary | ICD-10-CM

## 2011-10-24 DIAGNOSIS — R5383 Other fatigue: Secondary | ICD-10-CM

## 2011-10-24 DIAGNOSIS — F411 Generalized anxiety disorder: Secondary | ICD-10-CM

## 2011-10-24 DIAGNOSIS — E0500 Thyrotoxicosis with diffuse goiter without thyrotoxic crisis or storm: Secondary | ICD-10-CM

## 2011-10-24 DIAGNOSIS — J45909 Unspecified asthma, uncomplicated: Secondary | ICD-10-CM

## 2011-10-24 LAB — CBC
Hemoglobin: 14.1 g/dL (ref 12.0–15.0)
MCH: 30.7 pg (ref 26.0–34.0)
MCV: 92.8 fL (ref 78.0–100.0)
RBC: 4.59 MIL/uL (ref 3.87–5.11)

## 2011-10-24 LAB — T4, FREE: Free T4: 1.18 ng/dL (ref 0.80–1.80)

## 2011-10-24 LAB — COMPREHENSIVE METABOLIC PANEL
Alkaline Phosphatase: 76 U/L (ref 39–117)
BUN: 12 mg/dL (ref 6–23)
Creat: 0.87 mg/dL (ref 0.50–1.10)
Glucose, Bld: 60 mg/dL — ABNORMAL LOW (ref 70–99)
Total Bilirubin: 0.2 mg/dL — ABNORMAL LOW (ref 0.3–1.2)

## 2011-10-24 MED ORDER — ALBUTEROL SULFATE (2.5 MG/3ML) 0.083% IN NEBU: 2.5000 mg | INHALATION_SOLUTION | Freq: Four times a day (QID) | RESPIRATORY_TRACT | Status: DC | PRN

## 2011-10-24 MED ORDER — CLONAZEPAM 1 MG PO TABS: ORAL_TABLET | ORAL | Status: DC

## 2011-10-24 MED ORDER — GUAIFENESIN-CODEINE 100-10 MG/5ML PO SOLN: 5.0000 mL | Freq: Three times a day (TID) | ORAL | Status: AC | PRN

## 2011-10-24 NOTE — Progress Notes (Signed)
  Subjective:    Patient ID: [name], female    DOB: 31-Jan-1971, 40 y.o.   MRN: [id number]  HPI  New patient to my practice (former Luretha Murphy patient) Graves disease: she is fairly certain she is not on enough thyroid medicine as she feels tired. She has also gained weight. 2. Chronic asthma with some hx COPD---cough keeping her awake at night--previously had a cough syrup that helped--would like a refil. Also needs a new neb machine. 3. Chronic anxiety pretty well controlled at this time---she is a little agoraphobic and a little claustrophobic 4. Had a knee surgery (arthroscopy Dr Dion Saucier 07/2011) done and that went extremely well---walking up to 4 miles without pain.  Review of Systems    Pertinent review of systems: negative for fever or unusual weight change. See hpi  Objective:   Physical Exam   Vital signs reviewed. GENERAL: Well developed, well nourished, no acute distress HEENT: B exophalmos,  NECK no thyromegakly is noted. No carotid bruits LUNGS CTA B CV RRR no murmur PSYCH somewhat pressured speech. AxOx4. Asks and answers questions appropriately. Normal thought content.    Assessment & Plan:  1. Graves disease. Thyroid panel. Will discuss results with her---I think her fatigue she reports iss unlikely related to this. She is very adamant that she needs more thyrpoid hormone. 2. Generalized anxiety--continue current meds. 3. Bipolar disorder---some pressured speech today___I am not sure what her baseline is---does not seem to have any manic symptoms, no flight of ideas. I think regular q 3 m ap[pt may be in her best interest rtc 69m 4. Asthma / COPD refilled her cough syrup---do no tplan on this being a chronic med however. New rx for neb machine and neb soln refills discussed smoking----she also uses wood heat secondary  To finances.

## 2011-10-24 NOTE — Telephone Encounter (Signed)
Dear Cliffton Asters Team pleqase call in the cough syrup I ordered for her Also, please send the rx I laid on your desk to [facility] so they can get her a new neb machine THANKS! Denny Levy

## 2011-10-24 NOTE — Telephone Encounter (Signed)
Called in cough syrup and faxed rx's to [facility].[name]

## 2011-10-25 ENCOUNTER — Other Ambulatory Visit: Payer: Self-pay | Admitting: Family Medicine

## 2011-10-25 MED ORDER — LEVOTHYROXINE SODIUM 150 MCG PO TABS: 150.0000 ug | ORAL_TABLET | Freq: Every day | ORAL | Status: DC

## 2012-01-16 ENCOUNTER — Encounter (HOSPITAL_COMMUNITY): Payer: Self-pay | Admitting: *Deleted

## 2012-01-16 ENCOUNTER — Emergency Department (HOSPITAL_COMMUNITY)
Admission: EM | Admit: 2012-01-16 | Discharge: 2012-01-16 | Discharge status: Against Medical Advice | Payer: Medicaid Other | Attending: Emergency Medicine | Admitting: Emergency Medicine

## 2012-01-16 ENCOUNTER — Ambulatory Visit (HOSPITAL_COMMUNITY): Admission: RE | Admit: 2012-01-16 | Payer: Medicaid Other | Source: Home / Self Care | Admitting: Psychiatry

## 2012-01-16 DIAGNOSIS — F329 Major depressive disorder, single episode, unspecified: Secondary | ICD-10-CM

## 2012-01-16 DIAGNOSIS — Z79899 Other long term (current) drug therapy: Secondary | ICD-10-CM | POA: Insufficient documentation

## 2012-01-16 DIAGNOSIS — J449 Chronic obstructive pulmonary disease, unspecified: Secondary | ICD-10-CM | POA: Insufficient documentation

## 2012-01-16 DIAGNOSIS — F32A Depression, unspecified: Secondary | ICD-10-CM

## 2012-01-16 DIAGNOSIS — E0500 Thyrotoxicosis with diffuse goiter without thyrotoxic crisis or storm: Secondary | ICD-10-CM | POA: Insufficient documentation

## 2012-01-16 DIAGNOSIS — J4489 Other specified chronic obstructive pulmonary disease: Secondary | ICD-10-CM | POA: Insufficient documentation

## 2012-01-16 DIAGNOSIS — F17200 Nicotine dependence, unspecified, uncomplicated: Secondary | ICD-10-CM | POA: Insufficient documentation

## 2012-01-16 DIAGNOSIS — F3289 Other specified depressive episodes: Secondary | ICD-10-CM | POA: Insufficient documentation

## 2012-01-16 DIAGNOSIS — F29 Unspecified psychosis not due to a substance or known physiological condition: Secondary | ICD-10-CM | POA: Insufficient documentation

## 2012-01-16 HISTORY — DX: Depression, unspecified: F32.A

## 2012-01-16 HISTORY — DX: Chronic obstructive pulmonary disease, unspecified: J44.9

## 2012-01-16 HISTORY — DX: Anxiety disorder, unspecified: F41.9

## 2012-01-16 HISTORY — DX: Reserved for concepts with insufficient information to code with codable children: IMO0002

## 2012-01-16 HISTORY — DX: Disorder of kidney and ureter, unspecified: N28.9

## 2012-01-16 HISTORY — DX: Major depressive disorder, single episode, unspecified: F32.9

## 2012-01-16 HISTORY — DX: Bipolar disorder, unspecified: F31.9

## 2012-01-16 LAB — COMPREHENSIVE METABOLIC PANEL
Albumin: 4 g/dL (ref 3.5–5.2)
Alkaline Phosphatase: 68 U/L (ref 39–117)
BUN: 5 mg/dL — ABNORMAL LOW (ref 6–23)
Calcium: 9.3 mg/dL (ref 8.4–10.5)
GFR calc Af Amer: >90 mL/min (ref >90)
Glucose, Bld: 94 mg/dL (ref 70–99)
Potassium: 3.7 mEq/L (ref 3.5–5.1)
Total Protein: 7.2 g/dL (ref 6.0–8.3)

## 2012-01-16 LAB — RAPID URINE DRUG SCREEN, HOSP PERFORMED
Amphetamines: NOT DETECTED
Benzodiazepines: NOT DETECTED
Cocaine: POSITIVE — AB
Opiates: NOT DETECTED

## 2012-01-16 LAB — URINALYSIS, ROUTINE W REFLEX MICROSCOPIC
Bilirubin Urine: NEGATIVE
Glucose, UA: NEGATIVE mg/dL
Hgb urine dipstick: NEGATIVE
Ketones, ur: NEGATIVE mg/dL
Protein, ur: NEGATIVE mg/dL
pH: 6 (ref 5.0–8.0)

## 2012-01-16 LAB — CBC
HCT: 40.3 % (ref 36.0–46.0)
MCH: 29.7 pg (ref 26.0–34.0)
MCHC: 33.3 g/dL (ref 30.0–36.0)
RDW: 12.3 % (ref 11.5–15.5)

## 2012-01-16 LAB — URINE MICROSCOPIC-ADD ON

## 2012-01-16 LAB — ETHANOL: Alcohol, Ethyl (B): <11 mg/dL (ref 0–11)

## 2012-01-16 MED ORDER — TRAZODONE HCL 50 MG PO TABS: 50.0000 mg | ORAL_TABLET | Freq: Every evening | ORAL | Status: DC | PRN

## 2012-01-16 MED ORDER — MAGNESIUM HYDROXIDE 400 MG/5ML PO SUSP: 30.0000 mL | Freq: Every day | ORAL | Status: DC | PRN

## 2012-01-16 MED ORDER — ALUM & MAG HYDROXIDE-SIMETH 200-200-20 MG/5ML PO SUSP: 30.0000 mL | ORAL | Status: DC | PRN

## 2012-01-16 MED ORDER — ACETAMINOPHEN 325 MG PO TABS: 650.0000 mg | ORAL_TABLET | Freq: Four times a day (QID) | ORAL | Status: DC | PRN

## 2012-01-16 NOTE — ED Notes (Signed)
Pt is visibly anxious and stating that she is experiencing "stupid suicidal thoughts".  Pt states that she does not have a plan for harming herself, and states that her only experience with similar feelings occurred circa 9 years ago at which point she did not attempt to hurt herself. Pt states that she is stressed about the tension between herself and her significant other of 25 years who has taken her youngest child that she has consequently not seen x 6 days.

## 2012-01-16 NOTE — ED Notes (Signed)
MD at bedside. 
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
## 2012-01-16 NOTE — ED Provider Notes (Addendum)
History     CSN: [id number]  Arrival date & time 01/16/12  0135   First MD Initiated Contact with Patient 01/16/12 [phone]      Chief Complaint  Patient presents with  . Medical Clearance    (Consider location/radiation/quality/duration/timing/severity/associated sxs/prior treatment) Patient is a 41 y.o. female presenting with mental health disorder. The history is provided by the patient. No language interpreter was used.  Mental Health Problem The primary symptoms include dysphoric mood. The primary symptoms do not include delusions, hallucinations, bizarre behavior, disorganized speech, negative symptoms or somatic symptoms. The current episode started today. This is a new problem.  The dysphoric mood began this week. The mood has been worsening since its onset. She characterizes the problem as severe. The mood includes feelings of sadness, tearfulness and despair. Her change in mood was precipitated by the death of a loved one and a stressful event.  The onset of the illness is precipitated by a stressful event. The degree of incapacity that she is experiencing as a consequence of her illness is severe. Sequelae of the illness include harmed interpersonal relations. Additional symptoms of the illness include anhedonia. Additional symptoms of the illness do not include no psychomotor retardation, no euphoric mood, no abdominal pain or no seizures. She admits to suicidal ideas (states the feeling had passed and she sought help because she realized it was "stupid" and she would not want to harm herself). She does not have a plan to commit suicide. She does not contemplate harming herself. She has not already injured self. She does not contemplate injuring another person. She has not already  injured another person. Risk factors that are present for mental illness include a history of mental illness.    Past Medical History  Diagnosis Date  . Depression   . Anxiety   . Bipolar 1 disorder   .  Thyroid disease     graves   . COPD (chronic obstructive pulmonary disease)   . Renal disorder     intercysticial cystitis.  Marland Kitchen Ulcer     Past Surgical History  Procedure Date  . Knee arthrocentesis     right  . Abdominal hysterectomy     No family history on file.  History  Substance Use Topics  . Smoking status: Current Everyday Smoker -- 1.0 packs/day    Types: Cigarettes  . Smokeless tobacco: Not on file  . Alcohol Use: No    OB History    Grav Para Term Preterm Abortions TAB SAB Ect Mult Living                  Review of Systems  Constitutional: Negative.   HENT: Negative.   Eyes: Negative.   Respiratory: Negative.   Cardiovascular: Negative.   Gastrointestinal: Negative.  Negative for abdominal pain.  Genitourinary: Negative.   Musculoskeletal: Negative.   Skin: Negative.   Neurological: Negative for seizures.  Hematological: Negative.   Psychiatric/Behavioral: Positive for suicidal ideas, confusion and dysphoric mood. Negative for hallucinations and self-injury.    Allergies  Hydrocodone  Home Medications   Current Outpatient Rx  Name Route Sig Dispense Refill  . ALBUTEROL SULFATE (2.5 MG/3ML) 0.083% IN NEBU Nebulization Take 3 mLs (2.5 mg total) by nebulization every 6 (six) hours as needed. 75 mL 12  . ALBUTEROL SULFATE HFA 108 (90 BASE) MCG/ACT IN AERS Inhalation Inhale 2 puffs into the lungs 4 (four) times daily. As needed    . BECLOMETHASONE DIPROPIONATE 40 MCG/ACT IN AERS Inhalation Inhale  2 puffs into the lungs 2 (two) times daily. 1 Inhaler 12  . CALCIUM CARBONATE-VITAMIN D 500-200 MG-UNIT PO TABS Oral Take 1 tablet by mouth 3 (three) times daily.     Marland Kitchen CLONAZEPAM 1 MG PO TABS  Take one tablet by mouth three times a day for anxiety Refill on schedule 90 tablet 3  . ESOMEPRAZOLE MAGNESIUM 40 MG PO CPDR Oral Take 40 mg by mouth 2 (two) times daily.     Marland Kitchen FLUTICASONE PROPIONATE 50 MCG/ACT NA SUSP Nasal Place 1 spray into the nose daily. 16 g 2    . LEVOTHYROXINE SODIUM 150 MCG PO TABS Oral Take 1 tablet (150 mcg total) by mouth daily. 90 tablet 1  . TRAZODONE HCL 100 MG PO TABS Oral Take 1 tablet (100 mg total) by mouth at bedtime. As needed      BP 124/89  Pulse 67  Temp(Src) 97.6 F (36.4 C) (Oral)  Resp 19  SpO2 98%  Physical Exam  Constitutional: She is oriented to person, place, and time. She appears well-developed and well-nourished.  HENT:  Head: Normocephalic and atraumatic.  Mouth/Throat: Oropharynx is clear and moist. No oropharyngeal exudate.  Eyes: Conjunctivae are normal. Pupils are equal, round, and reactive to light.  Neck: Normal range of motion. Neck supple.  Cardiovascular: Normal rate and regular rhythm.   Pulmonary/Chest: Effort normal and breath sounds normal. She has no wheezes.  Abdominal: Soft. Bowel sounds are normal. There is no tenderness. There is no rebound and no guarding.  Musculoskeletal: Normal range of motion.  Neurological: She is alert and oriented to person, place, and time.  Skin: Skin is warm and dry. She is not diaphoretic.  Psychiatric: Her affect is labile. She expresses suicidal ideation.    ED Course  Procedures (including critical care time)  Labs Reviewed  COMPREHENSIVE METABOLIC PANEL - Abnormal; Notable for the following:    BUN 5 (*)    Total Bilirubin 0.2 (*)    All other components within normal limits  CBC  ETHANOL  URINE RAPID DRUG SCREEN (HOSP PERFORMED)  URINALYSIS, ROUTINE W REFLEX MICROSCOPIC   No results found.   No diagnosis found.    MDM  Plan was to admit to [facility] following labs and bed availability but patient left without informing staff before bed became available.    Jasmine Awe, MD 01/16/12 [phone]

## 2012-01-16 NOTE — ED Notes (Signed)
Pt in room, talking loud on cell phone, this RN at door, pt states "i ve been here forever, these fuckers are taking too long". Pt reassured that process still in progress, awaiting all lab results to be completed. Pt states "i am not waiting much longer". MD made aware.

## 2012-01-16 NOTE — ED Notes (Signed)
Pt decided to leave before placement to [facility], pt runs to door, MD, Psych made aware.

## 2012-01-16 NOTE — ED Notes (Signed)
BH contacted to verify status of bed, pt has bed available pending medical screening

## 2012-01-16 NOTE — BHH Counselor (Signed)
Pt left AMA, Psych not made aware of patient elopement.  Pt was sent to ed for med clearance and holding until bed made avail.  Decided not to wait.
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
## 2012-01-16 NOTE — ED Notes (Signed)
Lab contacted to inquire about UDS, results will be available w/ 15 min

## 2012-01-16 NOTE — ED Notes (Signed)
This RN bedside to speak with pt, pt denies SI/HI, pt states  "i have a bed at [facility] i just need my blood done".

## 2012-01-17 ENCOUNTER — Telehealth: Payer: Self-pay | Admitting: Family Medicine

## 2012-01-17 DIAGNOSIS — E039 Hypothyroidism, unspecified: Secondary | ICD-10-CM

## 2012-01-17 MED ORDER — ARIPIPRAZOLE 10 MG PO TBDP: 10.0000 mg | ORAL_TABLET | Freq: Every day | ORAL | Status: AC

## 2012-01-17 NOTE — Telephone Encounter (Signed)
LVM informing patient of below.
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
## 2012-01-17 NOTE — Telephone Encounter (Signed)
Patient is calling because she wanted to see Dr. Jennette Kettle tomorrow, but Dr. Jennette Kettle doesn't have anything until the beginning of April.  She didn't make an appointment, she just wanted to leave a message for Dr. Jennette Kettle to call her because she is concerned about her Thyroid level.

## 2012-01-17 NOTE — Telephone Encounter (Signed)
Dear [name] Team Please lether know that she can come in for a lab only---I have oput the orders in. THANKS! Denny Levy

## 2012-01-17 NOTE — Telephone Encounter (Signed)
Dear Cliffton Asters Team OK I have sent a rx for abilify in---I am starting her at a 10 mg a day dose---she needs to make an appt for 3-5 weeks from now [facility]! Denny Levy

## 2012-01-18 ENCOUNTER — Other Ambulatory Visit: Payer: Medicaid Other

## 2012-01-18 DIAGNOSIS — E039 Hypothyroidism, unspecified: Secondary | ICD-10-CM

## 2012-01-18 NOTE — Progress Notes (Signed)
tsh done today [name] 

## 2012-01-19 LAB — TSH: TSH: 0.076 u[IU]/mL — ABNORMAL LOW (ref 0.350–4.500)

## 2012-01-28 ENCOUNTER — Encounter: Payer: Self-pay | Admitting: Family Medicine

## 2012-01-28 DIAGNOSIS — F1410 Cocaine abuse, uncomplicated: Secondary | ICD-10-CM | POA: Insufficient documentation

## 2012-01-28 DIAGNOSIS — F1210 Cannabis abuse, uncomplicated: Secondary | ICD-10-CM | POA: Insufficient documentation

## 2012-01-29 ENCOUNTER — Encounter: Payer: Self-pay | Admitting: Family Medicine

## 2012-01-29 ENCOUNTER — Other Ambulatory Visit: Payer: Self-pay | Admitting: Family Medicine

## 2012-01-29 MED ORDER — ESOMEPRAZOLE MAGNESIUM 40 MG PO CPDR: DELAYED_RELEASE_CAPSULE | ORAL | Status: DC

## 2012-01-29 MED ORDER — ALBUTEROL SULFATE HFA 108 (90 BASE) MCG/ACT IN AERS: 2.0000 | INHALATION_SPRAY | Freq: Four times a day (QID) | RESPIRATORY_TRACT | Status: DC

## 2012-01-29 MED ORDER — LEVOTHYROXINE SODIUM 137 MCG PO CAPS: ORAL_CAPSULE | ORAL | Status: DC

## 2012-01-31 ENCOUNTER — Telehealth: Payer: Self-pay | Admitting: *Deleted

## 2012-01-31 NOTE — Telephone Encounter (Signed)
PA required for Nexium. Form placed in MD box. 

## 2012-02-12 ENCOUNTER — Telehealth: Payer: Self-pay | Admitting: *Deleted

## 2012-02-12 ENCOUNTER — Other Ambulatory Visit: Payer: Self-pay | Admitting: Family Medicine

## 2012-02-12 MED ORDER — PANTOPRAZOLE SODIUM 20 MG PO TBEC: 20.0000 mg | DELAYED_RELEASE_TABLET | Freq: Every day | ORAL | Status: DC

## 2012-02-12 NOTE — Telephone Encounter (Signed)
Dr. Jennette Kettle switched Nexium to pantoprazole 20 mg. Called pharmacy and cancelled Nexium  RX and called in Pantoprazole.

## 2012-02-18 DIAGNOSIS — E0500 Thyrotoxicosis with diffuse goiter without thyrotoxic crisis or storm: Secondary | ICD-10-CM | POA: Insufficient documentation

## 2012-03-06 ENCOUNTER — Other Ambulatory Visit: Payer: Self-pay | Admitting: Family Medicine

## 2012-03-06 DIAGNOSIS — F411 Generalized anxiety disorder: Secondary | ICD-10-CM

## 2012-03-06 DIAGNOSIS — F419 Anxiety disorder, unspecified: Secondary | ICD-10-CM

## 2012-03-06 MED ORDER — CLONAZEPAM 1 MG PO TABS: ORAL_TABLET | ORAL | Status: DC

## 2012-04-10 ENCOUNTER — Telehealth: Payer: Self-pay | Admitting: *Deleted

## 2012-04-10 NOTE — Telephone Encounter (Signed)
Patient not seen since December was supposed to follow up in 3 months.  Last Rx filled by Dr McDiarmid for one month.  No refills unless ok'd by PCP

## 2012-04-10 NOTE — Telephone Encounter (Signed)
Refill request from pharmacy was received this AM and placed in MD box. Angelique Blonder states patient called this morning to verify that we had received RX.   Dr. Jennette Kettle is not here this afternoon.  Patient calls this afternoon and spoke with Angelique Blonder again about refill  stating she needs this medication called in today because she is leaving tomorrow AM early .  Consulted with Dr. Deirdre Priest and he advises that Dr. Jennette Kettle will need to  OK this refill. Attempted call back to patient . Message was left on her voicemail that I will check with Dr.  Jennette Kettle tomorrow AM and will call her back.

## 2012-04-10 NOTE — Telephone Encounter (Signed)
The medication that patient is requesting is Clonazepam.

## 2012-04-11 ENCOUNTER — Other Ambulatory Visit: Payer: Self-pay | Admitting: Family Medicine

## 2012-04-11 DIAGNOSIS — F419 Anxiety disorder, unspecified: Secondary | ICD-10-CM

## 2012-04-11 DIAGNOSIS — F411 Generalized anxiety disorder: Secondary | ICD-10-CM

## 2012-04-11 MED ORDER — CLONAZEPAM 1 MG PO TABS: ORAL_TABLET | ORAL | Status: DC

## 2012-04-11 NOTE — Progress Notes (Signed)
Faxed form back to [facility] Denny Levy

## 2012-04-11 NOTE — Telephone Encounter (Addendum)
Rx for Clonazepam faxed to pharmacy and confirmation received. Message left on voicemail that RX has been faxed

## 2012-04-29 ENCOUNTER — Telehealth: Payer: Self-pay | Admitting: Family Medicine

## 2012-04-29 MED ORDER — ALBUTEROL SULFATE (2.5 MG/3ML) 0.083% IN NEBU: 2.5000 mg | INHALATION_SOLUTION | Freq: Four times a day (QID) | RESPIRATORY_TRACT | Status: DC | PRN

## 2012-04-29 NOTE — Telephone Encounter (Signed)
Provided refill of albuterol per Dr. Jennette Kettle.

## 2012-04-29 NOTE — Telephone Encounter (Signed)
Attempted call back to patient .  Message states voicemail has not been set  up. Will try to get in touch with Dr. Jennette Kettle about this.

## 2012-04-29 NOTE — Telephone Encounter (Signed)
Called back and this time  was able to leave to leave message . Advised that RX has been sent to [facility]. Advised to call for appointment .

## 2012-04-29 NOTE — Telephone Encounter (Signed)
Paged Dr. Jennette Kettle and she advises may send in. Called [facility] and  We cannot call in verbally . They need order faxed. Will check with preceptor.

## 2012-04-29 NOTE — Telephone Encounter (Signed)
Is out of her albuterol solution for her nebulizer and she is having a hard time breathing.  Needs to have this today. Send to [facility] so they can bring it to her.

## 2012-05-05 ENCOUNTER — Other Ambulatory Visit: Payer: Self-pay | Admitting: Family Medicine

## 2012-05-05 DIAGNOSIS — F411 Generalized anxiety disorder: Secondary | ICD-10-CM

## 2012-05-05 DIAGNOSIS — F419 Anxiety disorder, unspecified: Secondary | ICD-10-CM

## 2012-05-05 DIAGNOSIS — E039 Hypothyroidism, unspecified: Secondary | ICD-10-CM

## 2012-05-05 NOTE — Telephone Encounter (Signed)
Patient is calling because she is going to need refills before her appt on 7/10.  She is on the Wait List if there is a cancellation before then.  She needs refills on Synthroid, her inhaler and Clonazepam sent to [facility].  She also has some questions for Dr. Jennette Kettle about her medications.

## 2012-05-06 NOTE — Telephone Encounter (Signed)
Called pt to get more information concerning meds. She wanted to discuss some type of diet pill that she can take. I told her that I would forward this to Dr. Jennette Kettle and would get back to her with regards to her refills.Laureen Ochs, Viann Shove

## 2012-05-08 NOTE — Telephone Encounter (Signed)
Dear Cliffton Asters Team I do NOT prescribe diet pills--of ant type THANKS! Denny Levy

## 2012-05-09 MED ORDER — CLONAZEPAM 1 MG PO TABS: ORAL_TABLET | ORAL | Status: DC

## 2012-05-09 MED ORDER — LEVOTHYROXINE SODIUM 137 MCG PO CAPS: ORAL_CAPSULE | ORAL | Status: DC

## 2012-05-09 NOTE — Telephone Encounter (Signed)
Called and told rx has been sent.[name]

## 2012-05-09 NOTE — Telephone Encounter (Signed)
Patient is calling to see if the Rx has been sent.  [facility]

## 2012-05-09 NOTE — Telephone Encounter (Signed)
Dear Cliffton Asters Team Yes you can give a 1 m refill THANKS! Denny Levy

## 2012-05-21 ENCOUNTER — Ambulatory Visit: Payer: Self-pay | Admitting: Family Medicine

## 2012-06-06 ENCOUNTER — Telehealth: Payer: Self-pay | Admitting: Family Medicine

## 2012-06-06 DIAGNOSIS — F419 Anxiety disorder, unspecified: Secondary | ICD-10-CM

## 2012-06-06 DIAGNOSIS — F411 Generalized anxiety disorder: Secondary | ICD-10-CM

## 2012-06-06 MED ORDER — CLONAZEPAM 1 MG PO TABS: ORAL_TABLET | ORAL | Status: DC

## 2012-06-06 NOTE — Telephone Encounter (Signed)
Please write rx for Clonazepam and call when ready for pickup

## 2012-06-06 NOTE — Telephone Encounter (Signed)
LVM informing patient of below and that she can pick up rx on monday

## 2012-06-06 NOTE — Telephone Encounter (Signed)
Dear Cliffton Asters Team I am puttingv this rx up front. She HAS to have an appointment  To see me before next refill THANKS! Denny Levy

## 2012-06-25 ENCOUNTER — Ambulatory Visit: Payer: Self-pay | Admitting: Family Medicine

## 2012-07-16 ENCOUNTER — Encounter: Payer: Self-pay | Admitting: Family Medicine

## 2012-07-16 ENCOUNTER — Ambulatory Visit (INDEPENDENT_AMBULATORY_CARE_PROVIDER_SITE_OTHER): Payer: Medicaid Other | Admitting: Family Medicine

## 2012-07-16 VITALS — BP 118/99 | HR 77 | Temp 99.2°F | Ht 62.0 in | Wt 158.0 lb

## 2012-07-16 DIAGNOSIS — E0500 Thyrotoxicosis with diffuse goiter without thyrotoxic crisis or storm: Secondary | ICD-10-CM

## 2012-07-16 DIAGNOSIS — N3010 Interstitial cystitis (chronic) without hematuria: Secondary | ICD-10-CM

## 2012-07-16 DIAGNOSIS — F319 Bipolar disorder, unspecified: Secondary | ICD-10-CM

## 2012-07-16 DIAGNOSIS — F411 Generalized anxiety disorder: Secondary | ICD-10-CM

## 2012-07-17 NOTE — Assessment & Plan Note (Signed)
Head her treatment for interstitial cystitis today. They give her morphine evidently during the treatment.

## 2012-07-17 NOTE — Assessment & Plan Note (Signed)
Reports no new issues with her anxiety. Says she's taking her Klonopin a regularly and not having any problems with that.

## 2012-07-17 NOTE — Assessment & Plan Note (Signed)
Was seen in emergency room in March and briefly seen by behavioral health but evidently was not hospitalized. She reports no new medications. She says that was just a "blip" in that she's been doing quite well with mood stability.

## 2012-07-17 NOTE — Progress Notes (Signed)
  Subjective:    Patient ID: [name], female    DOB: 1971/10/14, 41 y.o.   MRN: [id number]  HPI #1. Here for followup of chronic anxiety and bipolar disorder. She had a small "blip" on her course in March where she was seen at the emergency room and then briefly by mail health but was not hospitalized by her report. Since then she's been doing quite well. Feels her mood is stable. Continues on her Klonopin without any problems. She's not having any lows or hives of mood. Denies any unusual outbursts of anger. Reports the episode of cocaine in her urine it was found on her ED visit in March was a "moment of poor judgment". Denies using illicits currently.  #2. She is followed by urology for chronic cystitis of the interstitial bite. She did receive a treatment this morning. He evidently give her morphine with that said today she says she is "a little out of it" from the morphine of the procedure this morning.  #3. Asthma/COPD. She's been doing pretty well. Denies any new symptoms, denies any change in her intermittent chronic cough or need for increased use of her rescue inhaler. #4Luiz Blare' disease. She is scheduled for eye surgery next month. Her Graves' disease had been handled by ophthalmologist at [facility]. She has some residual I. perfusion and says she's finally getting around again that taken care of next month. She denies any heart palpitations, unusual weight change, cold or heat sensitivity. Denies rash and itching.   Review of Systems Please see history of present illness    Objective:   Physical Exam Vital signs are reviewed GENERAL: Well-developed female no acute distress. She does seem somewhat sleepy this morning. HEENT her left eye does seem to bulge more than the right. Pupils are equal round react to light extraocular muscles are intact without nystagmus. Conjunctivae nonicteric CARDIOVASCULAR regular rate rhythm LUNGS: Clear to auscultation bilaterally PSYCH: Alert  and oriented x4. Answers questions and asks questions appropriate. Speech is normal and fluent in content. MUSCULOSKELETAL: No psychomotor retardation       Assessment & Plan:

## 2012-07-17 NOTE — Assessment & Plan Note (Signed)
Having left eye surgery for residual effects of Graves' disease next month. She sees ophthalmologist at [facility]. She continues on her Synthroid replacement without problem.

## 2012-07-31 DIAGNOSIS — H02849 Edema of unspecified eye, unspecified eyelid: Secondary | ICD-10-CM | POA: Insufficient documentation

## 2012-08-07 ENCOUNTER — Encounter: Payer: Self-pay | Admitting: Family Medicine

## 2012-08-07 DIAGNOSIS — H0520 Unspecified exophthalmos: Secondary | ICD-10-CM | POA: Insufficient documentation

## 2012-08-28 ENCOUNTER — Other Ambulatory Visit: Payer: Self-pay | Admitting: *Deleted

## 2012-08-28 DIAGNOSIS — E039 Hypothyroidism, unspecified: Secondary | ICD-10-CM

## 2012-08-28 MED ORDER — LEVOTHYROXINE SODIUM 137 MCG PO CAPS: ORAL_CAPSULE | ORAL | Status: DC

## 2012-11-07 ENCOUNTER — Other Ambulatory Visit: Payer: Self-pay | Admitting: *Deleted

## 2012-11-07 DIAGNOSIS — F411 Generalized anxiety disorder: Secondary | ICD-10-CM

## 2012-11-07 DIAGNOSIS — F419 Anxiety disorder, unspecified: Secondary | ICD-10-CM

## 2012-11-10 MED ORDER — CLONAZEPAM 1 MG PO TABS: ORAL_TABLET | ORAL | Status: DC

## 2012-11-10 NOTE — Telephone Encounter (Signed)
Patient is calling because she is now completely out of her Clonazepam.  She sent the refill request on Friday and her pharmacy hasn't heard back anything.  When she called in the request, she had some left, but now she doesn't so she really wants this taken care of today.  Her pharmacy will be closing at 7:00 this evening and will only be open 1/2 day tomorrow.

## 2012-11-10 NOTE — Telephone Encounter (Signed)
Refill called into pharm.[name]

## 2013-01-13 ENCOUNTER — Other Ambulatory Visit: Payer: Self-pay | Admitting: *Deleted

## 2013-01-13 DIAGNOSIS — E039 Hypothyroidism, unspecified: Secondary | ICD-10-CM

## 2013-01-13 MED ORDER — LEVOTHYROXINE SODIUM 137 MCG PO CAPS: ORAL_CAPSULE | ORAL | Status: DC

## 2013-02-19 ENCOUNTER — Encounter: Payer: Self-pay | Admitting: Family Medicine

## 2013-02-19 ENCOUNTER — Ambulatory Visit (INDEPENDENT_AMBULATORY_CARE_PROVIDER_SITE_OTHER): Payer: Medicaid Other | Admitting: Family Medicine

## 2013-02-19 ENCOUNTER — Encounter: Payer: Self-pay | Admitting: *Deleted

## 2013-02-19 VITALS — BP 144/94 | HR 105 | Ht 60.0 in | Wt 174.0 lb

## 2013-02-19 DIAGNOSIS — R519 Headache, unspecified: Secondary | ICD-10-CM | POA: Insufficient documentation

## 2013-02-19 DIAGNOSIS — J449 Chronic obstructive pulmonary disease, unspecified: Secondary | ICD-10-CM

## 2013-02-19 DIAGNOSIS — J441 Chronic obstructive pulmonary disease with (acute) exacerbation: Secondary | ICD-10-CM | POA: Insufficient documentation

## 2013-02-19 DIAGNOSIS — R51 Headache: Secondary | ICD-10-CM | POA: Insufficient documentation

## 2013-02-19 MED ORDER — ALBUTEROL SULFATE (2.5 MG/3ML) 0.083% IN NEBU
2.5000 mg | INHALATION_SOLUTION | Freq: Once | RESPIRATORY_TRACT | Status: AC
  Administered 2013-02-19: 2.5 mg via RESPIRATORY_TRACT

## 2013-02-19 MED ORDER — IPRATROPIUM BROMIDE 0.02 % IN SOLN: 0.5000 mg | Freq: Once | RESPIRATORY_TRACT | Status: DC

## 2013-02-19 MED ORDER — PREDNISONE 20 MG PO TABS: 20.0000 mg | ORAL_TABLET | Freq: Every day | ORAL | Status: DC

## 2013-02-19 MED ORDER — ALBUTEROL SULFATE (5 MG/ML) 0.5% IN NEBU: 2.5000 mg | INHALATION_SOLUTION | Freq: Once | RESPIRATORY_TRACT | Status: DC

## 2013-02-19 MED ORDER — IPRATROPIUM BROMIDE 0.02 % IN SOLN
0.5000 mg | Freq: Once | RESPIRATORY_TRACT | Status: AC
  Administered 2013-02-19: 0.5 mg via RESPIRATORY_TRACT

## 2013-02-19 MED ORDER — METHYLPREDNISOLONE SODIUM SUCC 125 MG IJ SOLR
125.0000 mg | Freq: Once | INTRAMUSCULAR | Status: DC
  Administered 2013-02-19: 125 mg via INTRAMUSCULAR

## 2013-02-19 MED ORDER — BECLOMETHASONE DIPROPIONATE 40 MCG/ACT IN AERS: 2.0000 | INHALATION_SPRAY | Freq: Two times a day (BID) | RESPIRATORY_TRACT | Status: DC

## 2013-02-19 MED ORDER — IBUPROFEN 400 MG PO TABS: 400.0000 mg | ORAL_TABLET | Freq: Four times a day (QID) | ORAL | Status: DC | PRN

## 2013-02-19 MED ORDER — IBUPROFEN 200 MG PO TABS
400.0000 mg | ORAL_TABLET | Freq: Once | ORAL | Status: DC
  Administered 2013-02-19: 400 mg via ORAL

## 2013-02-19 MED ORDER — ALBUTEROL SULFATE HFA 108 (90 BASE) MCG/ACT IN AERS: 2.0000 | INHALATION_SPRAY | Freq: Four times a day (QID) | RESPIRATORY_TRACT | Status: DC

## 2013-02-19 MED ORDER — ALBUTEROL SULFATE (2.5 MG/3ML) 0.083% IN NEBU: 2.5000 mg | INHALATION_SOLUTION | Freq: Four times a day (QID) | RESPIRATORY_TRACT | Status: DC | PRN

## 2013-02-19 MED ORDER — METHYLPREDNISOLONE SODIUM SUCC 125 MG IJ SOLR: 62.5000 mg | Freq: Once | INTRAMUSCULAR | Status: DC

## 2013-02-19 NOTE — Assessment & Plan Note (Signed)
  Headache: No neurologic deficit,no signs of meningeal irritation. Motrin 400mg  given,was well tolerated,headachce improved,but she insisted on getting opiate. I suggested she follow up with her PMD to discuss this. RTC in 1 wk for reassessment.

## 2013-02-19 NOTE — Progress Notes (Signed)
This encounter was created in error - please disregard.

## 2013-02-19 NOTE — Assessment & Plan Note (Signed)
  COPD and cough: With exacerbation   Albuterol + Ipratropium treatment given with IM solumedrol 62.5 mg X 1. Her wheezing resolved completely after treatment and she felts better. She was discharged home in stable condition on Prednisone,Albuterol Q4 hrs for 24 hrs and the prn,I restarted her back on Qvar. She is to RTC in 1 wk for follow up.She is advised to go to the ER if symptom reoccurs or worsens,she verbalized understanding.

## 2013-02-19 NOTE — Addendum Note (Signed)
Addended byArlyss Repress on: 02/19/2013 01:35 PM   Modules accepted: Orders

## 2013-02-19 NOTE — Progress Notes (Signed)
Subjective:     Patient ID: [name], female   DOB: 06/09/1971, 42 y.o.   MRN: [id number]  Headache  The problem occurs constantly. The pain is located in the bilateral and frontal region. The pain does not radiate. The quality of the pain is described as aching. The pain is at a severity of 10/10. The pain is severe. Associated symptoms include coughing, drainage, insomnia and sinus pressure. Pertinent negatives include no dizziness, ear pain, fever, numbness, photophobia, seizures, visual change or vomiting. Nothing aggravates the symptoms. She has tried acetaminophen for the symptoms. The treatment provided mild (She is requesting percocet for her headache,she stated she does not take Motrin or tramdol.) relief.  Cough This is a new problem. The current episode started in the past 7 days. The problem has been gradually worsening. The problem occurs constantly. The cough is productive of sputum (Greenish sputum). Associated symptoms include headaches, nasal congestion and wheezing. Pertinent negatives include no ear congestion, ear pain, fever or shortness of breath. Nothing aggravates the symptoms. She has tried a beta-agonist inhaler for the symptoms. The treatment provided mild relief.   Past Medical History  Diagnosis Date  . Depression   . Anxiety   . Bipolar 1 disorder   . Thyroid disease     graves   . COPD (chronic obstructive pulmonary disease)   . Renal disorder     intercysticial cystitis.  Marland Kitchen Ulcer      Review of Systems  Constitutional: Negative for fever.  HENT: Positive for sinus pressure. Negative for ear pain.   Eyes: Negative for photophobia and visual disturbance.  Respiratory: Positive for cough and wheezing. Negative for shortness of breath.   Gastrointestinal: Negative.  Negative for vomiting.  Neurological: Positive for headaches. Negative for dizziness, seizures and numbness.  Psychiatric/Behavioral: The patient has insomnia.   All other systems reviewed  and are negative.   Filed Vitals:   02/19/13 0929  BP: 144/94  Pulse: 105  Height: 5' (1.524 m)  Weight: 174 lb (78.926 kg)  SpO2: 97%  PF: 160 L/min       Objective:   Physical Exam  Nursing note and vitals reviewed. Constitutional: She is oriented to person, place, and time. She appears well-developed. No distress.  Eyes: Pupils are equal, round, and reactive to light.  Neck: Neck supple.  Cardiovascular: Normal rate, regular rhythm and normal heart sounds.   No murmur heard. Pulmonary/Chest: Effort normal. No respiratory distress. She has wheezes in the right upper field, the right middle field, the right lower field, the left upper field, the left middle field and the left lower field. She has no rhonchi. She has no rales.  Abdominal: Soft. Bowel sounds are normal. She exhibits no distension and no mass. There is no tenderness.  Neurological: She is alert and oriented to person, place, and time. She has normal strength and normal reflexes. No sensory deficit. GCS eye subscore is 4. GCS verbal subscore is 5. GCS motor subscore is 6. She displays no Babinski's sign on the right side. She displays no Babinski's sign on the left side.       Assessment:     COPD and cough: With exacerbation Headache: No neurologic deficit,no signs of meningeal irritation.    Plan:     1. Albuterol + Ipratropium treatment given with IM solumedrol 62.5 mg X 1. Her wheezing resolved completely after treatment and she felts better. She was discharged home in stable condition on Prednisone,Albuterol Q4 hrs for 24  hrs and the prn,I restarted her back on Qvar. She is to RTC in 1 wk for follow up.She is advised to go to the ER if symptom reoccurs or worsens,she verbalized understanding.  2. Motrin 400mg  given,was well tolerated,headachce improved,but she insisted on getting opiate. I suggested she follow up with her PMD to discuss this. RTC in 1 wk for reassessment.

## 2013-02-19 NOTE — Patient Instructions (Signed)

## 2013-04-15 ENCOUNTER — Telehealth: Payer: Self-pay | Admitting: Family Medicine

## 2013-04-15 NOTE — Telephone Encounter (Signed)
Pt dropped off paper work to be filled out concerning handicap sticker. °

## 2013-04-15 NOTE — Telephone Encounter (Signed)
Handicap Placard placed in Dr. Donnetta Hail box for completion.  [name]

## 2013-04-22 NOTE — Telephone Encounter (Signed)
Completed and mailed

## 2013-05-06 ENCOUNTER — Telehealth: Payer: Self-pay | Admitting: Family Medicine

## 2013-05-06 NOTE — Telephone Encounter (Signed)
Patient is calling about faxing a refill request to pharmacy on file for Clonazepam 1mg . JW

## 2013-05-06 NOTE — Telephone Encounter (Signed)
Will fwd to MD for review.  Zoila Ditullio L, CMA  

## 2013-05-08 ENCOUNTER — Other Ambulatory Visit: Payer: Self-pay | Admitting: Family Medicine

## 2013-05-08 DIAGNOSIS — F411 Generalized anxiety disorder: Secondary | ICD-10-CM

## 2013-05-08 DIAGNOSIS — F419 Anxiety disorder, unspecified: Secondary | ICD-10-CM

## 2013-05-08 MED ORDER — CLONAZEPAM 1 MG PO TABS: ORAL_TABLET | ORAL | Status: DC

## 2013-05-08 NOTE — Telephone Encounter (Signed)
Patient is calling to check the status of her refill on Clonazepam.  She has been trying for 2 days to get this refilled.  As it turns out, the pharmacy had been faxing the refill request to the wrong place (a [phone]).  I asked them to send it electronically, it should be coming in soon.  She is leaving for vacation, was supposed to leave this morning, and would appreciate if this could be rushed through.

## 2013-05-08 NOTE — Telephone Encounter (Signed)
Dr. Jennette Kettle has already left office for today.  Dr. Mauricio Po refilled Rx for Clonazepam #30 tabs (printed Rx).  Patient informed and will come pick up Rx today at front desk.  States she normally gets #90 tabs and wants to know if Dr. Jennette Kettle will write Rx for #60 to make up for her monthly supply.  Will route request to Dr. Jennette Kettle and call patient back next week.  Gaylene Brooks, RN

## 2013-05-08 NOTE — Telephone Encounter (Signed)
Patient is calling

## 2013-05-11 ENCOUNTER — Other Ambulatory Visit: Payer: Self-pay | Admitting: Family Medicine

## 2013-05-11 DIAGNOSIS — F419 Anxiety disorder, unspecified: Secondary | ICD-10-CM

## 2013-05-11 DIAGNOSIS — F411 Generalized anxiety disorder: Secondary | ICD-10-CM

## 2013-05-11 MED ORDER — CLONAZEPAM 1 MG PO TABS: ORAL_TABLET | ORAL | Status: DC

## 2013-05-11 NOTE — Progress Notes (Signed)
Melinda Can u call this in [place]! [name]

## 2013-05-11 NOTE — Telephone Encounter (Signed)
Dear Cliffton Asters Team Sorry for the delay--PLEASE let her know about the pharmacy using the wrong number (see phone note). The easiest way to fix this is for me to write a new rx for #90 witgh 5 refills starting next week---I can fax that to her pharmacy today and hopefully this will fix the issues.  BUT< I need o know which pharmacy exactly she wants ot to go too THANKS! Denny Levy

## 2013-05-11 NOTE — Telephone Encounter (Signed)
Apologized to pt for the confusion and delay regarding her Rx and that the pharmacy had been faxing the request to the wrong number so we never received it.  Pt verbalized understanding.  Please fax new Rx to [facility] per pt request today.  Chamaine Stankus, Darlyne Russian, CMA

## 2013-06-02 ENCOUNTER — Other Ambulatory Visit: Payer: Self-pay | Admitting: *Deleted

## 2013-06-02 DIAGNOSIS — E039 Hypothyroidism, unspecified: Secondary | ICD-10-CM

## 2013-06-02 MED ORDER — LEVOTHYROXINE SODIUM 137 MCG PO CAPS: ORAL_CAPSULE | ORAL | Status: DC

## 2013-06-22 ENCOUNTER — Telehealth: Payer: Self-pay | Admitting: Family Medicine

## 2013-06-22 NOTE — Telephone Encounter (Signed)
Attempted to return call - no answer. Elizabeth Ramsha Lonigro, RN-BSN  

## 2013-06-22 NOTE — Telephone Encounter (Signed)
[facility] has stopped delivering neb meds  - is there an alternative or will we need to change meds? Wyatt Haste, RN-BSN

## 2013-06-22 NOTE — Telephone Encounter (Signed)
Patient is panicking because [facility] is no longer dealing with Medicaid patients and she is running out of her Albuterol for her nebulizer and she doesn't know what to do.

## 2013-06-23 ENCOUNTER — Other Ambulatory Visit: Payer: Self-pay | Admitting: Family Medicine

## 2013-06-23 DIAGNOSIS — J441 Chronic obstructive pulmonary disease with (acute) exacerbation: Secondary | ICD-10-CM

## 2013-06-23 DIAGNOSIS — J449 Chronic obstructive pulmonary disease, unspecified: Secondary | ICD-10-CM

## 2013-06-23 DIAGNOSIS — R51 Headache: Secondary | ICD-10-CM

## 2013-06-23 MED ORDER — ALBUTEROL SULFATE (2.5 MG/3ML) 0.083% IN NEBU: 2.5000 mg | INHALATION_SOLUTION | Freq: Four times a day (QID) | RESPIRATORY_TRACT | Status: DC | PRN

## 2013-06-23 NOTE — Telephone Encounter (Signed)
Pt called and given message - verbalized understanding. Elizabeth Kieren Ricci, RN-BSN  

## 2013-06-23 NOTE — Telephone Encounter (Signed)
I assume [facility] is her Home HHealth company? If theya re no longer doing home deliveryof nebs, she can call other Home Health agencies and see who she wants to use. Once she find one, let us know and we can send her neb rx to them [facility]! Denny Levy

## 2013-07-10 ENCOUNTER — Other Ambulatory Visit: Payer: Self-pay | Admitting: Family Medicine

## 2013-07-27 ENCOUNTER — Telehealth: Payer: Self-pay | Admitting: Family Medicine

## 2013-07-27 NOTE — Telephone Encounter (Signed)
Patient calls stating that she has been coughing for the past week and now her ribs are hurting. Would like Dr. Jennette Kettle to call her in something if possible. Offered patient an appt with another MD this week, since Dr. Jennette Kettle is not in clinic but she states she only wants to see Dr. Jennette Kettle.

## 2013-07-27 NOTE — Telephone Encounter (Signed)
Spoke to patient,advised her to see a Dr on cc: she at first agreed ,than wanted to know if that Dr would write her a rx for narcotic,I asked her why she would need that and she answer she has rib cage pain from coughing,I stated we usually don't prescribe Narcotics for that kind of pain and she got upset wanting to see Dr Lenny Pastel looking at Dr Marcelina Morel schedule ibuprofen told her the 8th of Oct was the soonest after going back and forth she agreed to be seen this wed morning. Rorey Hodges, [name]

## 2013-07-29 ENCOUNTER — Encounter: Payer: Self-pay | Admitting: Family Medicine

## 2013-07-29 ENCOUNTER — Ambulatory Visit (INDEPENDENT_AMBULATORY_CARE_PROVIDER_SITE_OTHER): Payer: Medicaid Other | Admitting: Family Medicine

## 2013-07-29 VITALS — BP 114/73 | HR 74 | Temp 97.9°F | Ht 60.0 in | Wt 167.0 lb

## 2013-07-29 DIAGNOSIS — H5000 Unspecified esotropia: Secondary | ICD-10-CM | POA: Insufficient documentation

## 2013-07-29 DIAGNOSIS — J441 Chronic obstructive pulmonary disease with (acute) exacerbation: Secondary | ICD-10-CM

## 2013-07-29 MED ORDER — PREDNISONE 20 MG PO TABS: 40.0000 mg | ORAL_TABLET | Freq: Every day | ORAL | Status: DC

## 2013-07-29 MED ORDER — KETOROLAC TROMETHAMINE 30 MG/ML IJ SOLN
30.0000 mg | Freq: Once | INTRAMUSCULAR | Status: AC
  Administered 2013-07-29: 30 mg via INTRAMUSCULAR

## 2013-07-29 MED ORDER — DOXYCYCLINE HYCLATE 100 MG PO TABS: 100.0000 mg | ORAL_TABLET | Freq: Two times a day (BID) | ORAL | Status: DC

## 2013-07-29 NOTE — Addendum Note (Signed)
Addended by: Jimmy Footman K on: 07/29/2013 10:22 AM   Modules accepted: Orders

## 2013-07-29 NOTE — Progress Notes (Signed)
Family Medicine Office Visit Note   Subjective:   Patient ID: [name], female  DOB: 20-Aug-1971, 42 y.o.. MRN: [id number]   Pt that comes today for same day appointment complaining of resp symptoms for about 2 weeks. She reports started with sinus congestion respiratory symptoms at this progressed to productive cough with greenish sputum. Cough is worse at night but the patient denies any shortness of breath, orthopnea.  Patient mentions that her cough is so bad that the R side of her ribs hurts when she coughs. She has had this in the past with other exacerbations and has resolved with Toradol IM. She is requesting one dose of Toradol at this time.  She denies fever, nausea, vomiting or headache.   Review of Systems:  Per HPI  Objective:   Physical Exam: Gen:  NAD HEENT: Moist mucous membranes  CV: Regular rate and rhythm, no murmurs rubs or gallops. PULM: Diminished sounds to auscultation bilaterally and wheezes. I did not auscultate rales. Chest: No tenderness to palpation of ribs.  ABD: Soft, non tender, non distended, normal bowel sounds EXT: No edema Neuro: Alert and oriented x3. No focalization  Assessment & Plan:

## 2013-07-29 NOTE — Patient Instructions (Addendum)
You have an exacerbation of your COPD. As prescribed you prednisone, take 2 tablets every day for 5 days. Also I have prescribed antibiotic named doxycycline, take 1 tablet twice a day for 7 days. Your albuterol nebulizer solution and inhaler has been recently prescribed with refills for one year. Use bronchodilator every 4 hours as needed for shortness of breath/wheezing Followup with your primary care doctor if your condition does not improve or worsens.

## 2013-07-29 NOTE — Assessment & Plan Note (Addendum)
Last event was in April this year. Normal oxygen saturation, afebrile, no toxic appearance. Will treat as uncomplicated COPD exacerbation with prednisone 40 mg for 5 days, doxycycline as antibiotic, and her albuterol q. 4h PRN. No signs on the physical exam of refracture it seem more costochondritis from excessive cough. Will give Toradol 30 mg IM x1 Discussed signs of worsening condition that should prompt re-evaluation. Followup as needed. Instructed patient to come back for her flu and pneumonia shot.

## 2013-09-18 ENCOUNTER — Telehealth: Payer: Self-pay | Admitting: Family Medicine

## 2013-09-18 NOTE — Telephone Encounter (Signed)
[name] is requesting a statement from you to state her medical conditions that you are seeing her for and for how long she's been coming to [facility].  She need this for her SS disability review.  Would like to pick this up before th 17th of this month.

## 2013-09-20 ENCOUNTER — Emergency Department (HOSPITAL_COMMUNITY): Payer: Medicaid Other

## 2013-09-20 ENCOUNTER — Encounter (HOSPITAL_COMMUNITY): Payer: Self-pay | Admitting: Emergency Medicine

## 2013-09-20 ENCOUNTER — Emergency Department (HOSPITAL_COMMUNITY)
Admission: EM | Admit: 2013-09-20 | Discharge: 2013-09-20 | Disposition: A | Discharge status: Home / Self Care | Payer: Medicaid Other | Attending: Emergency Medicine | Admitting: Emergency Medicine

## 2013-09-20 DIAGNOSIS — E079 Disorder of thyroid, unspecified: Secondary | ICD-10-CM | POA: Insufficient documentation

## 2013-09-20 DIAGNOSIS — Z79899 Other long term (current) drug therapy: Secondary | ICD-10-CM | POA: Insufficient documentation

## 2013-09-20 DIAGNOSIS — F411 Generalized anxiety disorder: Secondary | ICD-10-CM | POA: Insufficient documentation

## 2013-09-20 DIAGNOSIS — Z87448 Personal history of other diseases of urinary system: Secondary | ICD-10-CM | POA: Insufficient documentation

## 2013-09-20 DIAGNOSIS — IMO0002 Reserved for concepts with insufficient information to code with codable children: Secondary | ICD-10-CM | POA: Insufficient documentation

## 2013-09-20 DIAGNOSIS — J020 Streptococcal pharyngitis: Secondary | ICD-10-CM

## 2013-09-20 DIAGNOSIS — Z872 Personal history of diseases of the skin and subcutaneous tissue: Secondary | ICD-10-CM | POA: Insufficient documentation

## 2013-09-20 DIAGNOSIS — F17200 Nicotine dependence, unspecified, uncomplicated: Secondary | ICD-10-CM | POA: Insufficient documentation

## 2013-09-20 DIAGNOSIS — J441 Chronic obstructive pulmonary disease with (acute) exacerbation: Secondary | ICD-10-CM

## 2013-09-20 DIAGNOSIS — Z792 Long term (current) use of antibiotics: Secondary | ICD-10-CM | POA: Insufficient documentation

## 2013-09-20 DIAGNOSIS — F319 Bipolar disorder, unspecified: Secondary | ICD-10-CM | POA: Insufficient documentation

## 2013-09-20 HISTORY — DX: Interstitial cystitis (chronic) without hematuria: N30.10

## 2013-09-20 LAB — CBC WITH DIFFERENTIAL/PLATELET
Basophils Absolute: 0.1 10*3/uL (ref 0.0–0.1)
Basophils Relative: 0 % (ref 0–1)
Eosinophils Absolute: 0 10*3/uL (ref 0.0–0.7)
Hemoglobin: 15.2 g/dL — ABNORMAL HIGH (ref 12.0–15.0)
Lymphocytes Relative: 11 % — ABNORMAL LOW (ref 12–46)
Lymphs Abs: 1.8 10*3/uL (ref 0.7–4.0)
MCHC: 34.5 g/dL (ref 30.0–36.0)
Monocytes Absolute: 1 10*3/uL (ref 0.1–1.0)
Neutro Abs: 12.7 10*3/uL — ABNORMAL HIGH (ref 1.7–7.7)
Platelets: 259 10*3/uL (ref 150–400)
RDW: 12.5 % (ref 11.5–15.5)
WBC: 15.5 10*3/uL — ABNORMAL HIGH (ref 4.0–10.5)

## 2013-09-20 LAB — POCT I-STAT, CHEM 8
BUN: 5 mg/dL — ABNORMAL LOW (ref 6–23)
Calcium, Ion: 1.15 mmol/L (ref 1.12–1.23)
Chloride: 101 mEq/L (ref 96–112)
Creatinine, Ser: 0.9 mg/dL (ref 0.50–1.10)
Glucose, Bld: 125 mg/dL — ABNORMAL HIGH (ref 70–99)
HCT: 45 % (ref 36.0–46.0)
Sodium: 137 mEq/L (ref 135–145)
TCO2: 24 mmol/L (ref 0–100)

## 2013-09-20 LAB — RAPID STREP SCREEN (MED CTR MEBANE ONLY): Streptococcus, Group A Screen (Direct): POSITIVE — AB

## 2013-09-20 MED ORDER — AMOXICILLIN-POT CLAVULANATE 875-125 MG PO TABS: 1.0000 | ORAL_TABLET | Freq: Two times a day (BID) | ORAL | Status: DC

## 2013-09-20 MED ORDER — PREDNISONE 10 MG PO TABS: 50.0000 mg | ORAL_TABLET | Freq: Every day | ORAL | Status: DC

## 2013-09-20 MED ORDER — PREDNISONE 10 MG PO TABS: 50.0000 mg | ORAL_TABLET | Freq: Every day | ORAL | Status: AC

## 2013-09-20 MED ORDER — DEXAMETHASONE SODIUM PHOSPHATE 10 MG/ML IJ SOLN
10.0000 mg | Freq: Once | INTRAMUSCULAR | Status: AC
  Administered 2013-09-20: 10 mg via INTRAVENOUS
  Filled 2013-09-20: qty 1

## 2013-09-20 MED ORDER — SODIUM CHLORIDE 0.9 % IV SOLN: Freq: Once | INTRAVENOUS | Status: DC

## 2013-09-20 MED ORDER — ACETAMINOPHEN 325 MG PO TABS
650.0000 mg | ORAL_TABLET | Freq: Once | ORAL | Status: AC
  Administered 2013-09-20: 650 mg via ORAL
  Filled 2013-09-20: qty 2

## 2013-09-20 MED ORDER — ALBUTEROL SULFATE (5 MG/ML) 0.5% IN NEBU
2.5000 mg | INHALATION_SOLUTION | Freq: Once | RESPIRATORY_TRACT | Status: AC
  Administered 2013-09-20: 2.5 mg via RESPIRATORY_TRACT
  Filled 2013-09-20: qty 0.5

## 2013-09-20 MED ORDER — SODIUM CHLORIDE 0.9 % IV BOLUS (SEPSIS)
1000.0000 mL | Freq: Once | INTRAVENOUS | Status: AC
  Administered 2013-09-20: 1000 mL via INTRAVENOUS

## 2013-09-20 MED ORDER — IPRATROPIUM BROMIDE 0.02 % IN SOLN: 0.5000 mg | Freq: Once | RESPIRATORY_TRACT | Status: DC

## 2013-09-20 MED ORDER — LIDOCAINE VISCOUS 2 % MT SOLN
15.0000 mL | Freq: Once | OROMUCOSAL | Status: AC
  Administered 2013-09-20: 15 mL via OROMUCOSAL
  Filled 2013-09-20: qty 15

## 2013-09-20 MED ORDER — HYDROCODONE-ACETAMINOPHEN 5-325 MG PO TABS: 1.0000 | ORAL_TABLET | ORAL | Status: DC | PRN

## 2013-09-20 MED ORDER — ALBUTEROL SULFATE (5 MG/ML) 0.5% IN NEBU: 2.5000 mg | INHALATION_SOLUTION | Freq: Once | RESPIRATORY_TRACT | Status: DC

## 2013-09-20 MED ORDER — KETOROLAC TROMETHAMINE 30 MG/ML IJ SOLN
30.0000 mg | Freq: Once | INTRAMUSCULAR | Status: AC
  Administered 2013-09-20: 30 mg via INTRAVENOUS
  Filled 2013-09-20: qty 1

## 2013-09-20 MED ORDER — IPRATROPIUM BROMIDE 0.02 % IN SOLN
0.5000 mg | Freq: Once | RESPIRATORY_TRACT | Status: AC
  Administered 2013-09-20: 0.5 mg via RESPIRATORY_TRACT
  Filled 2013-09-20: qty 2.5

## 2013-09-20 NOTE — ED Provider Notes (Signed)
CSN: [id number]     Arrival date & time 09/20/13  1249 History   First MD Initiated Contact with Patient 09/20/13 1323     Chief Complaint  Patient presents with  . Sore Throat   (Consider location/radiation/quality/duration/timing/severity/associated sxs/prior Treatment) HPI Comments: The patient is a 42 year old female with a past medical history of COPD, Tobacco Dependence and Bipolar presenting with worsening sore throat for 3 days ago.  She reports a subjective fever yesterday.  She reports rhinorrhea, cough with white sputum, and wheezing despite two nebulizer today. No known sick contacts. Reports bilateral ear pain without discharge or change in hearing. She reports associated headache and neck pain. Denies nausea, vomiting, or diarrhea.    The history is provided by the patient.    Past Medical History  Diagnosis Date  . Depression   . Anxiety   . Bipolar 1 disorder   . Thyroid disease     graves   . COPD (chronic obstructive pulmonary disease)   . Renal disorder     intercysticial cystitis.  Marland Kitchen Ulcer   . Interstitial cystitis    Past Surgical History  Procedure Laterality Date  . Knee arthrocentesis      right  . Abdominal hysterectomy     No family history on file. History  Substance Use Topics  . Smoking status: Current Every Day Smoker -- 1.00 packs/day    Types: Cigarettes  . Smokeless tobacco: Not on file  . Alcohol Use: No   OB History   Grav Para Term Preterm Abortions TAB SAB Ect Mult Living                 Review of Systems  All other systems reviewed and are negative.    Allergies  Hydrocodone  Home Medications   Current Outpatient Rx  Name  Route  Sig  Dispense  Refill  . albuterol (PROVENTIL) (2.5 MG/3ML) 0.083% nebulizer solution   Nebulization   Take 3 mLs (2.5 mg total) by nebulization every 6 (six) hours as needed.   75 mL   12   . amoxicillin-clavulanate (AUGMENTIN) 875-125 MG per tablet   Oral   Take 1 tablet by mouth every  12 (twelve) hours.   14 tablet   0   . beclomethasone (QVAR) 40 MCG/ACT inhaler   Inhalation   Inhale 2 puffs into the lungs 2 (two) times daily.   1 Inhaler   1   . calcium-vitamin D (CALCIUM 500/D) 500-200 MG-UNIT per tablet   Oral   Take 1 tablet by mouth 3 (three) times daily.          . clonazePAM (KLONOPIN) 1 MG tablet      Take one tablet by mouth three times a day for anxiety Do not fill until after May 18, 2013   90 tablet   5   . doxycycline (VIBRA-TABS) 100 MG tablet   Oral   Take 1 tablet (100 mg total) by mouth 2 (two) times daily.   14 tablet   0   . EXPIRED: fluticasone (FLONASE) 50 MCG/ACT nasal spray   Nasal   Place 1 spray into the nose daily.   16 g   2   . HYDROcodone-acetaminophen (NORCO/VICODIN) 5-325 MG per tablet   Oral   Take 1 tablet by mouth every 4 (four) hours as needed.   5 tablet   0   . ibuprofen (ADVIL,MOTRIN) 400 MG tablet   Oral   Take 1 tablet (400  mg total) by mouth every 6 (six) hours as needed for pain.   30 tablet   0   . Levothyroxine Sodium 137 MCG CAPS      Take one by mouth daily   30 capsule   3   . EXPIRED: pantoprazole (PROTONIX) 20 MG tablet   Oral   Take 1 tablet (20 mg total) by mouth daily.   30 tablet   12   . predniSONE (DELTASONE) 10 MG tablet   Oral   Take 5 tablets (50 mg total) by mouth daily.   25 tablet   0   . predniSONE (DELTASONE) 20 MG tablet   Oral   Take 2 tablets (40 mg total) by mouth daily.   10 tablet   0   . PROAIR HFA 108 (90 BASE) MCG/ACT inhaler      INHALE 2 PUFFS INTO THE LUNGS 4 TIMES DAILY AS NEEDED   8.5 g   12   . traZODone (DESYREL) 100 MG tablet   Oral   Take 1 tablet (100 mg total) by mouth at bedtime. As needed          BP 123/84  Pulse 95  Temp(Src) 98.3 F (36.8 C) (Oral)  Resp 18  SpO2 98% Physical Exam  Nursing note and vitals reviewed. Constitutional: She is oriented to person, place, and time. She appears well-developed and  well-nourished. No distress.  HENT:  Head: Normocephalic and atraumatic.  Right Ear: Tympanic membrane normal. Tympanic membrane is not erythematous and not bulging.  Left Ear: Tympanic membrane normal. Tympanic membrane is not erythematous and not bulging.  Nose: Rhinorrhea present.  Mouth/Throat: Uvula is midline. Mucous membranes are not dry. No trismus in the jaw. No dental abscesses or dental caries. Oropharyngeal exudate, posterior oropharyngeal edema and posterior oropharyngeal erythema present.  Patient is able to handle secretions.  No stridor present and no signs of a peritonsillar abscess.  Eyes: Pupils are equal, round, and reactive to light.  Neck: Normal range of motion. Neck supple. No tracheal deviation present.  Cardiovascular: Normal rate and regular rhythm.   Pulmonary/Chest: Effort normal. She has wheezes. She has no rales. She exhibits no tenderness.  Inspiratory and expiratory wheezing.  Prolonged expiratory phase.   Abdominal: Soft. Bowel sounds are normal. There is no tenderness. There is no rebound and no guarding.  Musculoskeletal: Normal range of motion. She exhibits no edema.  Lymphadenopathy:       Head (right side): Tonsillar adenopathy present. No submental, no submandibular and no occipital adenopathy present.       Head (left side): Tonsillar adenopathy present. No submental, no submandibular and no occipital adenopathy present.  Neurological: She is alert and oriented to person, place, and time.  Skin: Skin is warm and dry. No rash noted.    ED Course  Procedures (including critical care time) Labs Review Labs Reviewed  RAPID STREP SCREEN - Abnormal; Notable for the following:    Streptococcus, Group A Screen (Direct) POSITIVE (*)    All other components within normal limits  CBC WITH DIFFERENTIAL - Abnormal; Notable for the following:    WBC 15.5 (*)    Hemoglobin 15.2 (*)    Neutrophils Relative % 82 (*)    Neutro Abs 12.7 (*)    Lymphocytes  Relative 11 (*)    All other components within normal limits  POCT I-STAT, CHEM 8 - Abnormal; Notable for the following:    BUN 5 (*)    Glucose, Bld 125 (*)  Hemoglobin 15.3 (*)    All other components within normal limits   Imaging Review Dg Chest 2 View  09/20/2013   CLINICAL DATA:  Chest pain, shortness of breath  EXAM: CHEST  2 VIEW  COMPARISON:  01/27/2008  FINDINGS: Lungs are essentially clear. Stable 5 mm calcified granuloma at the right lung base. No pleural effusion or pneumothorax.  The heart is normal in size.  Visualized osseous structures are within normal limits.  IMPRESSION: No evidence of acute cardiopulmonary disease.   Electronically Signed   By: Charline Bills M.D.   On: 09/20/2013 14:19    EKG Interpretation   None       MDM   1. Strep pharyngitis   2. COPD exacerbation     The patient presents with 3 days of sore throat.  On exam she has tonsillar exudate, erythremia, and tonsillar swelling.  Patient has a history of COPD and has used 2 nebulizer at home.  She is wheezing in the exam room today. X-ray ordered to evaluate pneumonia. Discussed patient condition with Dr. Rhunette Croft and agrees on current labs and plan at this time.   Albuterol with atrovent ordered.  Rapid strep- positive will treat with antibiotics.   Dr. Rhunette Croft assessed the patient and advises one more breathing treatment.  Re-eval: patient after 2 breathing treatment, patient with minimal expiratory wheezing at bases.  Discussed lab results, imagine results, and treatment plan with the patient.  She reports understanding and no other concerns at this time.    Patient is stable for discharge at this time. Antibiotics, pain medication and steroids given.    Meds given in ED:  Medications  albuterol (PROVENTIL) (5 MG/ML) 0.5% nebulizer solution 2.5 mg (2.5 mg Nebulization Not Given 09/20/13 1515)  ipratropium (ATROVENT) nebulizer solution 0.5 mg (0.5 mg Nebulization Not Given 09/20/13  1515)  sodium chloride 0.9 % bolus 1,000 mL (0 mLs Intravenous Stopped 09/20/13 1537)  acetaminophen (TYLENOL) tablet 650 mg (650 mg Oral Given 09/20/13 1409)  dexamethasone (DECADRON) injection 10 mg (10 mg Intravenous Given 09/20/13 1448)  ipratropium (ATROVENT) nebulizer solution 0.5 mg (0.5 mg Nebulization Given 09/20/13 1443)  albuterol (PROVENTIL) (5 MG/ML) 0.5% nebulizer solution 2.5 mg (2.5 mg Nebulization Given 09/20/13 1443)  lidocaine (XYLOCAINE) 2 % viscous mouth solution 15 mL (15 mLs Mouth/Throat Given 09/20/13 1538)  ketorolac (TORADOL) 30 MG/ML injection 30 mg (30 mg Intravenous Given 09/20/13 1538)    New Prescriptions   AMOXICILLIN-CLAVULANATE (AUGMENTIN) 875-125 MG PER TABLET    Take 1 tablet by mouth every 12 (twelve) hours.   HYDROCODONE-ACETAMINOPHEN (NORCO/VICODIN) 5-325 MG PER TABLET    Take 1 tablet by mouth every 4 (four) hours as needed.   PREDNISONE (DELTASONE) 10 MG TABLET    Take 5 tablets (50 mg total) by mouth daily.      Clabe Seal, PA-C 09/23/13 1143   Medical screening examination/treatment/procedure(s) were performed by non-physician practitioner and as supervising physician I was immediately available for consultation/collaboration.  EKG Interpretation   None        Derwood Kaplan, MD 09/28/13 1537

## 2013-09-20 NOTE — ED Notes (Signed)
Pt c/o sore throat since Fri and inability to eat since Sat.  Large amount of swelling noted to soft tissue of the throat.

## 2013-09-29 ENCOUNTER — Encounter: Payer: Self-pay | Admitting: Family Medicine

## 2013-09-29 NOTE — Telephone Encounter (Signed)
Left message on voiced letting her know letter is ready for pick up. [name], [name]

## 2013-09-29 NOTE — Telephone Encounter (Signed)
Dear Cliffton Asters Team  I am putting the letter at front desk She maypick up at her convenience THANKS! Denny Levy

## 2013-10-07 ENCOUNTER — Ambulatory Visit: Payer: Self-pay | Admitting: Family Medicine

## 2013-10-26 ENCOUNTER — Other Ambulatory Visit: Payer: Self-pay | Admitting: Family Medicine

## 2013-10-26 NOTE — Telephone Encounter (Signed)
Dear Cliffton Asters Team I am giving her 3 m refills---I need to see her in March to recheck her thyroid lab work [employer]! Denny Levy

## 2013-10-27 NOTE — Telephone Encounter (Signed)
Attempted to call patient to inform her of below. No answer and cannot leave message.

## 2013-11-19 ENCOUNTER — Telehealth: Payer: Self-pay | Admitting: Family Medicine

## 2013-11-19 DIAGNOSIS — F419 Anxiety disorder, unspecified: Secondary | ICD-10-CM

## 2013-11-19 DIAGNOSIS — F411 Generalized anxiety disorder: Secondary | ICD-10-CM

## 2013-11-19 MED ORDER — CLONAZEPAM 1 MG PO TABS: ORAL_TABLET | ORAL | Status: DC

## 2013-11-19 NOTE — Telephone Encounter (Signed)
Has transportation problems Please call pt when refill is called in

## 2013-11-19 NOTE — Telephone Encounter (Signed)
Needs refill of clozapan--- Pharmacy: [facility] Please call pt when it is called in

## 2013-11-19 NOTE — Telephone Encounter (Signed)
rx was called in and pharmacist instructed to inform patient of no future refills unless seen in office before refill due. Zubin Pontillo, Virgel BouquetGiovanna S

## 2013-11-19 NOTE — Telephone Encounter (Signed)
Dear Cliffton AstersWhite Team Please call in one month of clonazepam as below--no refills, and tell her she needs to be seen before any more refills THANKS! Denny LevySara Reinhart Saulters

## 2013-12-04 ENCOUNTER — Telehealth: Payer: Self-pay | Admitting: Family Medicine

## 2013-12-04 NOTE — Telephone Encounter (Signed)
Dr. Jennette KettleNeal, Dr. Donnetta HailWreen had called about this situation just as patient was calling to speak to some one. Dr. Annabell HowellsWrenn had just gotten off the phone with patient about her lab results. He states that patient goes to him every 3 weeks for a bladder irrigation. Per him she receives 60 tablets of vicodin because of this. He has done 2 drug screens on patient on 11/11/2013 & 11/19/2013 both of the screenings were negative for opiates. He did not realize that there has been no narcotic contract signed for her since he took over her care. He now has gotten her to sign one. He is concerned about this patient due to the discrepancies with our 2 offices. She receives clonazepam from us but is not showing up in her urine, but tetra are. She has been informing him that she has been trying to contact us and has spoken with a nurse here, but she has not. He would like you to call him ASAP before the patient can be spoken to. His direct number is [phone]

## 2013-12-04 NOTE — Telephone Encounter (Signed)
Pt called and would like Dr. Jennette KettleNeal to call her concerning another doctor that she see's outside this practice. jw

## 2013-12-16 ENCOUNTER — Ambulatory Visit (INDEPENDENT_AMBULATORY_CARE_PROVIDER_SITE_OTHER): Payer: Medicaid Other | Admitting: Family Medicine

## 2013-12-16 ENCOUNTER — Encounter: Payer: Self-pay | Admitting: Family Medicine

## 2013-12-16 ENCOUNTER — Other Ambulatory Visit: Payer: Self-pay | Admitting: Family Medicine

## 2013-12-16 VITALS — BP 118/82 | HR 68 | Temp 97.8°F | Wt 170.7 lb

## 2013-12-16 DIAGNOSIS — F411 Generalized anxiety disorder: Secondary | ICD-10-CM

## 2013-12-16 DIAGNOSIS — E039 Hypothyroidism, unspecified: Secondary | ICD-10-CM

## 2013-12-16 DIAGNOSIS — H0520 Unspecified exophthalmos: Secondary | ICD-10-CM

## 2013-12-16 DIAGNOSIS — F319 Bipolar disorder, unspecified: Secondary | ICD-10-CM

## 2013-12-16 DIAGNOSIS — F1210 Cannabis abuse, uncomplicated: Secondary | ICD-10-CM

## 2013-12-16 DIAGNOSIS — Z653 Problems related to other legal circumstances: Secondary | ICD-10-CM

## 2013-12-16 DIAGNOSIS — F1410 Cocaine abuse, uncomplicated: Secondary | ICD-10-CM

## 2013-12-16 DIAGNOSIS — N3010 Interstitial cystitis (chronic) without hematuria: Secondary | ICD-10-CM

## 2013-12-16 DIAGNOSIS — F419 Anxiety disorder, unspecified: Secondary | ICD-10-CM

## 2013-12-16 DIAGNOSIS — IMO0001 Reserved for inherently not codable concepts without codable children: Secondary | ICD-10-CM

## 2013-12-16 LAB — COMPREHENSIVE METABOLIC PANEL
ALT: 18 U/L (ref 0–35)
AST: 15 U/L (ref 0–37)
Albumin: 4.4 g/dL (ref 3.5–5.2)
Alkaline Phosphatase: 78 U/L (ref 39–117)
BUN: 15 mg/dL (ref 6–23)
CO2: 28 meq/L (ref 19–32)
CREATININE: 0.77 mg/dL (ref 0.50–1.10)
Calcium: 9.7 mg/dL (ref 8.4–10.5)
Chloride: 102 mEq/L (ref 96–112)
GLUCOSE: 95 mg/dL (ref 70–99)
Potassium: 4.4 mEq/L (ref 3.5–5.3)
Sodium: 138 mEq/L (ref 135–145)
Total Bilirubin: 0.3 mg/dL (ref 0.2–1.2)
Total Protein: 7.4 g/dL (ref 6.0–8.3)

## 2013-12-16 MED ORDER — CLONAZEPAM 1 MG PO TABS: ORAL_TABLET | ORAL | Status: DC

## 2013-12-16 MED ORDER — MIRTAZAPINE 15 MG PO TBDP: 15.0000 mg | ORAL_TABLET | Freq: Every day | ORAL | Status: DC

## 2013-12-17 DIAGNOSIS — Z653 Problems related to other legal circumstances: Secondary | ICD-10-CM | POA: Insufficient documentation

## 2013-12-17 LAB — DRUG SCREEN URINE W/ALC, PAIN MGMT, REFLEX
Amphetamine Screen, Ur: NEGATIVE
Barbiturate Quant, Ur: NEGATIVE
Benzodiazepines.: NEGATIVE
COCAINE METABOLITES: NEGATIVE
Creatinine,U: 122.11 mg/dL
Methadone: NEGATIVE
Phencyclidine (PCP): NEGATIVE
Propoxyphene: NEGATIVE

## 2013-12-17 LAB — TSH: TSH: 2.897 u[IU]/mL (ref 0.350–4.500)

## 2013-12-17 NOTE — Assessment & Plan Note (Signed)
Given her recent stressors with her husband's illness I tried to get her to consider going back on mood stabilizing medication. Ultimately she did agree to taking some Remeron at night for sleep. I don't think this is likely the trigger manic phase we'll see her back in 3-4 weeks and will start a very low dose.

## 2013-12-17 NOTE — Progress Notes (Signed)
   Subjective:    Patient ID: [name], female    DOB: 1970-11-21, 43 y.o.   MRN: [id number]  HPI  #1. History of interstitial  Cystitis. Being followed by urologist in town over the last several years. He has evidently been giving her some intermittent prescriptions for Vicodin. Last 2 office visits he evidently the urine drug screen was negative so he has had discussion with her that is not giving her any more narcotic prescriptions. She is here today with her daughter mostly to discuss this and to see if I will be willing to give her Vicodin. Alternatively she wants a give her for her different urologist. She reports one week of a lot of diarrhea and nausea which she attributes to being "cut off suddenly" from her Vicodin prescription by Dr. Annabell HowellsWrenn. She then tells that she was only taking 4 Vicodin a day. #2. Family stressors: Her husband just diagnosed with astrocytoma of the brain. Nonoperative. #3. Anxiety. She reports taking her anxiety medicine regularly and says she does not know how she would get through life without it. She denies suicidal or homicidal ideation.  Review of Systems Denies fever, sweats, chills, unusual weight change. See history of present illness above for additional pertinent review of systems.    Objective:   Physical Exam  Vital signs are reviewed GENERAL: Well-developed female no acute distress HEENT. Bilaterally she has proptosis. Pupils are equal round reactive to light. Extraocular motion intact. Psychiatric: Oriented x4. Very tearful intermittently. Asks answers questions appropriately. She keeps coming back to the question of whether or not I will give her any pain medication. Speech is normal in content and fluency.      Assessment & Plan:

## 2013-12-17 NOTE — Assessment & Plan Note (Signed)
She reports she's going to have to have 2 more surgeries.

## 2013-12-17 NOTE — Assessment & Plan Note (Signed)
Will not be providing narcotics for this. I will give her information she wishes to seek referral to a different urologist.

## 2013-12-17 NOTE — Assessment & Plan Note (Signed)
I am worried given the recent findings of possible drug diversion from the narcotics for urologist was given her. I'm also sympathetic to the fact that her husband just been diagnosed with nonoperable brain cancer. I long talk with her and her daughter about appropriate use of medication. We'll get urine drug screen today. She preventively tells me that it may not show her benzodiazepines because she's quite been out for couple days because she took more than she should have on some dayss". We will not be providing her any narcotics. I will plan to one month of the current prescription for clonazepam and see her back in 3-4 weeks. She will get any refills without return to clinic. We will probably urine drug screen or when she comes back to

## 2013-12-18 LAB — OPIATES/OPIOIDS (LC/MS-MS)
CODEINE URINE: NEGATIVE ng/mL
Heroin (6-AM), UR: NEGATIVE ng/mL
Hydrocodone: 1895 ng/mL
Hydromorphone: 232 ng/mL
Morphine Urine: 644 ng/mL
Noroxycodone, Ur: 1892 ng/mL
Oxycodone, ur: 769 ng/mL
Oxymorphone: 421 ng/mL

## 2013-12-18 LAB — CANNABANOIDS (GC/LC/MS), URINE: THC-COOH (GC/LC/MS), ur confirm: 208 ng/mL — ABNORMAL HIGH

## 2014-01-11 ENCOUNTER — Telehealth: Payer: Self-pay | Admitting: Family Medicine

## 2014-01-11 DIAGNOSIS — F419 Anxiety disorder, unspecified: Secondary | ICD-10-CM

## 2014-01-11 DIAGNOSIS — F411 Generalized anxiety disorder: Secondary | ICD-10-CM

## 2014-01-11 NOTE — Telephone Encounter (Signed)
Pt called and needs a refill on her Clonazepam called into the pharmacy. She also lost the forms that the doctor filled out for her handicap sticker and wanted to get another one filled out. She also wanted to know if she could get refills on her medications so she wouldn't have to call us every month. jw

## 2014-01-11 NOTE — Telephone Encounter (Signed)
Please advise. [name] S  

## 2014-01-12 MED ORDER — CLONAZEPAM 1 MG PO TABS: ORAL_TABLET | ORAL | Status: DC

## 2014-01-12 NOTE — Telephone Encounter (Signed)
Dear Cliffton AstersWhite Team  1) Please call in ONE MONTH of clonazepam as below and then: 2) tell her: In answer to her question"No--I need to see her every 2 months given the  medication I have her on (the clonazepam)---new clinic rules. At last office visit I told her to follow up in 3-4 weeks--so I will give her ONE MONTH of clonazepam but she HAS to see me in next 3 -4 weeks and then it will be every other month"  THANKS! Denny Levy\Lattie Cervi

## 2014-01-12 NOTE — Telephone Encounter (Signed)
rx called in.Relayed message patient voiced understanding. Myndi Wamble, Virgel BouquetGiovanna S

## 2014-01-15 ENCOUNTER — Telehealth: Payer: Self-pay | Admitting: Family Medicine

## 2014-01-15 NOTE — Telephone Encounter (Signed)
Forms placed in Dr Donnetta HailNeal's box to be completed.[name], Virgel BouquetGiovanna S

## 2014-01-15 NOTE — Telephone Encounter (Signed)
Patient dropped off handicapped placard form to be filled out.  Please mail to her when completed. °

## 2014-01-21 ENCOUNTER — Telehealth: Payer: Self-pay | Admitting: *Deleted

## 2014-01-21 NOTE — Telephone Encounter (Signed)
Pt informed that the disability parking place card form is completed and will be placed in outgoing mail today.  Clovis PuMartin, Tamika L, RN

## 2014-01-27 ENCOUNTER — Ambulatory Visit: Payer: Self-pay | Admitting: Family Medicine

## 2014-01-28 ENCOUNTER — Ambulatory Visit (INDEPENDENT_AMBULATORY_CARE_PROVIDER_SITE_OTHER): Payer: Medicaid Other | Admitting: Family Medicine

## 2014-01-28 VITALS — BP 133/83 | HR 79 | Temp 98.0°F | Wt 173.0 lb

## 2014-01-28 DIAGNOSIS — F411 Generalized anxiety disorder: Secondary | ICD-10-CM

## 2014-01-28 DIAGNOSIS — F319 Bipolar disorder, unspecified: Secondary | ICD-10-CM

## 2014-01-28 DIAGNOSIS — J449 Chronic obstructive pulmonary disease, unspecified: Secondary | ICD-10-CM

## 2014-01-28 DIAGNOSIS — E0500 Thyrotoxicosis with diffuse goiter without thyrotoxic crisis or storm: Secondary | ICD-10-CM

## 2014-01-28 DIAGNOSIS — F419 Anxiety disorder, unspecified: Secondary | ICD-10-CM

## 2014-01-28 DIAGNOSIS — J441 Chronic obstructive pulmonary disease with (acute) exacerbation: Secondary | ICD-10-CM

## 2014-01-28 DIAGNOSIS — R51 Headache: Secondary | ICD-10-CM

## 2014-01-28 MED ORDER — MIRTAZAPINE 15 MG PO TBDP: 15.0000 mg | ORAL_TABLET | Freq: Every day | ORAL | Status: DC

## 2014-01-28 MED ORDER — CLONAZEPAM 1 MG PO TABS: ORAL_TABLET | ORAL | Status: DC

## 2014-01-28 MED ORDER — ALBUTEROL SULFATE (2.5 MG/3ML) 0.083% IN NEBU: 2.5000 mg | INHALATION_SOLUTION | Freq: Four times a day (QID) | RESPIRATORY_TRACT | Status: DC | PRN

## 2014-01-28 NOTE — Assessment & Plan Note (Signed)
Recent increase in stressors with husband diagnosis of brain cancer. We started Remeron at last office visit she is doing well. We'll continue that as well as her other medications. Followup 3 months, sooner with problems.

## 2014-01-28 NOTE — Progress Notes (Signed)
   Subjective:    Patient ID: [name], female    DOB: Aug 20, 1971, 43 y.o.   MRN: [id number]  HPI Followup starting Remeron. Sleeping much better. Feels more peaceful during the day. Still dealing with her husband's recent diagnosis of brain cancer. Denies suicidal or homicidal ideation. Still tearful quite often   Review of Systems See history of present illness    Objective:   Physical Exam Vital signs reviewed. GENERAL: Well-developed, well-nourished, no acute distress. CARDIOVASCULAR: Regular rate and rhythm no murmur gallop or rub LUNGS: Clear to auscultation bilaterally, no rales or wheeze. PSYCHIATRIC: Alert and oriented x4. Intermittently tearful. Asks and answers questions appropriately. Speech is normal in content and fluency. No hallucinations.        Assessment & Plan:

## 2014-01-28 NOTE — Assessment & Plan Note (Signed)
Refilled her home nebulizer albuterol.

## 2014-01-28 NOTE — Assessment & Plan Note (Signed)
Recently had her fourth surgery for her Graves' disease. Has several more surgeries planned. Continues on her Synthroid without issue.

## 2014-01-29 ENCOUNTER — Other Ambulatory Visit: Payer: Self-pay | Admitting: Family Medicine

## 2014-03-08 ENCOUNTER — Telehealth: Payer: Self-pay | Admitting: *Deleted

## 2014-03-08 NOTE — Telephone Encounter (Signed)
[facility] requesting NPI number; NPI given.  Clovis Puamika L Tessla Spurling, RN

## 2014-03-11 ENCOUNTER — Ambulatory Visit: Payer: Medicaid Other | Attending: Anesthesiology

## 2014-03-11 DIAGNOSIS — IMO0001 Reserved for inherently not codable concepts without codable children: Secondary | ICD-10-CM | POA: Diagnosis present

## 2014-03-11 DIAGNOSIS — M545 Low back pain, unspecified: Secondary | ICD-10-CM | POA: Insufficient documentation

## 2014-03-11 DIAGNOSIS — R293 Abnormal posture: Secondary | ICD-10-CM | POA: Diagnosis not present

## 2014-05-12 ENCOUNTER — Other Ambulatory Visit: Payer: Self-pay | Admitting: Family Medicine

## 2014-07-16 ENCOUNTER — Other Ambulatory Visit: Payer: Self-pay | Admitting: *Deleted

## 2014-07-16 DIAGNOSIS — F419 Anxiety disorder, unspecified: Secondary | ICD-10-CM

## 2014-07-16 DIAGNOSIS — F411 Generalized anxiety disorder: Secondary | ICD-10-CM

## 2014-07-16 MED ORDER — CLONAZEPAM 1 MG PO TABS: ORAL_TABLET | ORAL | Status: DC

## 2014-07-16 NOTE — Telephone Encounter (Signed)
Dear Cliffton Asters Team Can u call this in as above [facility]! Denny Levy

## 2014-07-20 ENCOUNTER — Encounter (HOSPITAL_COMMUNITY): Payer: Self-pay | Admitting: Emergency Medicine

## 2014-07-20 ENCOUNTER — Emergency Department (HOSPITAL_COMMUNITY)
Admission: EM | Admit: 2014-07-20 | Discharge: 2014-07-20 | Disposition: A | Discharge status: Home / Self Care | Payer: Medicaid Other | Attending: Emergency Medicine | Admitting: Emergency Medicine

## 2014-07-20 ENCOUNTER — Telehealth: Payer: Self-pay | Admitting: Family Medicine

## 2014-07-20 ENCOUNTER — Emergency Department (HOSPITAL_COMMUNITY): Payer: Medicaid Other

## 2014-07-20 ENCOUNTER — Other Ambulatory Visit: Payer: Self-pay | Admitting: *Deleted

## 2014-07-20 DIAGNOSIS — F319 Bipolar disorder, unspecified: Secondary | ICD-10-CM | POA: Insufficient documentation

## 2014-07-20 DIAGNOSIS — S8990XA Unspecified injury of unspecified lower leg, initial encounter: Secondary | ICD-10-CM | POA: Insufficient documentation

## 2014-07-20 DIAGNOSIS — J449 Chronic obstructive pulmonary disease, unspecified: Secondary | ICD-10-CM | POA: Insufficient documentation

## 2014-07-20 DIAGNOSIS — F411 Generalized anxiety disorder: Secondary | ICD-10-CM | POA: Diagnosis not present

## 2014-07-20 DIAGNOSIS — W108XXA Fall (on) (from) other stairs and steps, initial encounter: Secondary | ICD-10-CM | POA: Diagnosis not present

## 2014-07-20 DIAGNOSIS — F17200 Nicotine dependence, unspecified, uncomplicated: Secondary | ICD-10-CM | POA: Diagnosis not present

## 2014-07-20 DIAGNOSIS — Z96659 Presence of unspecified artificial knee joint: Secondary | ICD-10-CM | POA: Diagnosis not present

## 2014-07-20 DIAGNOSIS — S99919A Unspecified injury of unspecified ankle, initial encounter: Secondary | ICD-10-CM

## 2014-07-20 DIAGNOSIS — IMO0002 Reserved for concepts with insufficient information to code with codable children: Secondary | ICD-10-CM | POA: Diagnosis not present

## 2014-07-20 DIAGNOSIS — E079 Disorder of thyroid, unspecified: Secondary | ICD-10-CM | POA: Insufficient documentation

## 2014-07-20 DIAGNOSIS — S93401A Sprain of unspecified ligament of right ankle, initial encounter: Secondary | ICD-10-CM

## 2014-07-20 DIAGNOSIS — S93409A Sprain of unspecified ligament of unspecified ankle, initial encounter: Secondary | ICD-10-CM | POA: Insufficient documentation

## 2014-07-20 DIAGNOSIS — F419 Anxiety disorder, unspecified: Secondary | ICD-10-CM

## 2014-07-20 DIAGNOSIS — Y929 Unspecified place or not applicable: Secondary | ICD-10-CM | POA: Diagnosis not present

## 2014-07-20 DIAGNOSIS — J4489 Other specified chronic obstructive pulmonary disease: Secondary | ICD-10-CM | POA: Diagnosis not present

## 2014-07-20 DIAGNOSIS — Z872 Personal history of diseases of the skin and subcutaneous tissue: Secondary | ICD-10-CM | POA: Diagnosis not present

## 2014-07-20 DIAGNOSIS — Z79899 Other long term (current) drug therapy: Secondary | ICD-10-CM | POA: Diagnosis not present

## 2014-07-20 DIAGNOSIS — Y9389 Activity, other specified: Secondary | ICD-10-CM | POA: Insufficient documentation

## 2014-07-20 DIAGNOSIS — Z87448 Personal history of other diseases of urinary system: Secondary | ICD-10-CM | POA: Insufficient documentation

## 2014-07-20 DIAGNOSIS — S99929A Unspecified injury of unspecified foot, initial encounter: Secondary | ICD-10-CM

## 2014-07-20 MED ORDER — OXYCODONE-ACETAMINOPHEN 5-325 MG PO TABS: 2.0000 | ORAL_TABLET | ORAL | Status: DC | PRN

## 2014-07-20 MED ORDER — OXYCODONE-ACETAMINOPHEN 5-325 MG PO TABS
1.0000 | ORAL_TABLET | Freq: Once | ORAL | Status: AC
  Administered 2014-07-20: 1 via ORAL
  Filled 2014-07-20: qty 1

## 2014-07-20 NOTE — Telephone Encounter (Signed)
Pt called because we told her we phoned in her Klonopin on 9/4 but the pharmacy said that we didn't. Can we check on this. Please call patient when this is fixed. jw

## 2014-07-20 NOTE — ED Notes (Signed)
PT was transported to waiting area by W/C but refused further assistance because Pt stated she wanted to smoke before getting into Mother's car.

## 2014-07-20 NOTE — Telephone Encounter (Signed)
Called patient's pharmacy, they do have the Rx for her Klonopin. Called and informed patient that she can pick up her Rx, it is at the pharmacy waiting for her. Cailie Bosshart, Rodena Medin

## 2014-07-20 NOTE — ED Notes (Signed)
Pt reports tripping down two steps landing on right foot. Now reports 10/10 pain to foot. Pulses/sensation intact. No obvious deformity. NAD.

## 2014-07-20 NOTE — Discharge Instructions (Signed)
Rest, ice and elevate your ankle. Take Percocet as needed for pain. Wear ankle brace as needed for extra support. Bear weight as tolerated.

## 2014-07-20 NOTE — Telephone Encounter (Signed)
Medication Klonopin 1 mg tab; 1 tab three times a day as needed for anxiety, #90 with 5 refills called into pharmacy. Clovis Pu, RN

## 2014-07-20 NOTE — ED Provider Notes (Signed)
CSN: [id number]     Arrival date & time 07/20/14  1301 History  This chart was scribed for non-physician practitioner working with Flint Melter, MD, by Yevette Edwards, ED Scribe. This patient was seen in room TR06C/TR06C and the patient's care was started at 2:46 PM  First MD Initiated Contact with Patient 07/20/14 1423     Chief Complaint  Patient presents with  . Foot Pain    HPI Comments: Patient is a 43 year old female who presents with right foot pain that started this morning after she tripped on the stairs. The mechanism of injury was sudden ankle inversion. Patient reports hearing a "pop" sudden onset of throbbing, severe pain that is localized to right ankle. Patient reports progressive worsening of pain. Ankle movement and weight bearing activity make the pain worse. Nothing makes the pain better. Patient reports associated swelling. Patient has not tried anything for pain relief. Patient denies obvious deformity, numbness/tingling, coolness/weakness of extremity, bruising, and any other injury.       Past Medical History  Diagnosis Date  . Depression   . Anxiety   . Bipolar 1 disorder   . Thyroid disease     graves   . COPD (chronic obstructive pulmonary disease)   . Renal disorder     intercysticial cystitis.  Marland Kitchen Ulcer   . Interstitial cystitis    Past Surgical History  Procedure Laterality Date  . Knee arthrocentesis      right  . Abdominal hysterectomy     No family history on file. History  Substance Use Topics  . Smoking status: Current Every Day Smoker -- 1.00 packs/day    Types: Cigarettes  . Smokeless tobacco: Not on file  . Alcohol Use: No   OB History   Grav Para Term Preterm Abortions TAB SAB Ect Mult Living                 Review of Systems  Constitutional: Negative for fever, chills and fatigue.  HENT: Negative for trouble swallowing.   Eyes: Negative for visual disturbance.  Respiratory: Negative for shortness of breath.   Cardiovascular:  Negative for chest pain and palpitations.  Gastrointestinal: Negative for nausea, vomiting, abdominal pain and diarrhea.  Genitourinary: Negative for dysuria and difficulty urinating.  Musculoskeletal: Positive for arthralgias and joint swelling. Negative for neck pain.  Skin: Negative for color change.  Neurological: Negative for dizziness and weakness.  Psychiatric/Behavioral: Negative for dysphoric mood.      Allergies  Hydrocodone  Home Medications   Prior to Admission medications   Medication Sig Start Date End Date Taking? Authorizing Provider  albuterol (PROVENTIL) (2.5 MG/3ML) 0.083% nebulizer solution Take 3 mLs (2.5 mg total) by nebulization every 6 (six) hours as needed. 01/28/14   Nestor Ramp, MD  beclomethasone (QVAR) 40 MCG/ACT inhaler Inhale 2 puffs into the lungs 2 (two) times daily. 02/19/13 02/19/14  Janit Pagan, MD  clonazePAM (KLONOPIN) 1 MG tablet Take one tablet by mouth three times a day for anxiety 07/16/14   Nestor Ramp, MD  fluticasone [facility]) 50 MCG/ACT nasal spray Place 1 spray into the nose daily. 08/10/11 08/09/12  Mayo Ao, NP  ibuprofen (ADVIL,MOTRIN) 400 MG tablet Take 1 tablet (400 mg total) by mouth every 6 (six) hours as needed for pain. 02/19/13   Janit Pagan, MD  levothyroxine (SYNTHROID, LEVOTHROID) 137 MCG tablet TAKE 1 TABLET BY MOUTH ONCE DAILY 01/29/14   Nestor Ramp, MD  mirtazapine (REMERON SOL-TAB) 15 MG  disintegrating tablet Take 1 tablet (15 mg total) by mouth at bedtime. 01/28/14   Nestor Ramp, MD  pantoprazole (PROTONIX) 20 MG tablet Take 1 tablet (20 mg total) by mouth daily. 02/12/12 02/11/13  Nestor Ramp, MD  PROAIR HFA 108 (90 BASE) MCG/ACT inhaler INHALE 2 PUFFS BY MOUTH EVERY 4 HOURS ASNEEDED    Nestor Ramp, MD   BP 116/82  Pulse 74  Temp(Src) 97.6 F (36.4 C) (Oral)  Resp 17  Ht  (1.575 m)  Wt 160 lb (72.576 kg)  BMI 29.26 kg/m2  SpO2 96% Physical Exam  Nursing note and vitals reviewed. Constitutional: She is oriented  to person, place, and time. She appears well-developed and well-nourished. No distress.  HENT:  Head: Normocephalic and atraumatic.  Eyes: Conjunctivae are normal.  Neck: Normal range of motion.  Cardiovascular: Normal rate and regular rhythm.  Exam reveals no gallop and no friction rub.   No murmur heard. Pulmonary/Chest: Effort normal and breath sounds normal. She has no wheezes. She has no rales. She exhibits no tenderness.  Abdominal: Soft. She exhibits no distension. There is no tenderness. There is no rebound.  Musculoskeletal:  Limited ROM of right ankle due to pain. Mild generalized edema and lateral tenderness to palpation. No obvious deformity. Patient can wiggle toes.   Neurological: She is alert and oriented to person, place, and time. Coordination normal.  Speech is goal-oriented. Moves limbs without ataxia.   Skin: Skin is warm and dry.  Psychiatric: She has a normal mood and affect. Her behavior is normal.    ED Course  Procedures (including critical care time) Labs Review Labs Reviewed - No data to display  SPLINT APPLICATION Date/Time: 3:34 PM Authorized by: Emilia Beck Consent: Verbal consent obtained. Risks and benefits: risks, benefits and alternatives were discussed Consent given by: patient Splint applied by: orthopedic technician Location details: right ankle Splint type: ASO Supplies used: ASO brace Post-procedure: The splinted body part was neurovascularly unchanged following the procedure. Patient tolerance: Patient tolerated the procedure well with no immediate complications.     Imaging Review Dg Foot Complete Right  07/20/2014   CLINICAL DATA:  Recent fall with foot and ankle pain  EXAM: RIGHT FOOT COMPLETE - 3+ VIEW  COMPARISON:  None.  FINDINGS: Postsurgical changes are noted in the distal aspect of the first metatarsal bone. No acute fracture or dislocation is noted. No gross soft tissue abnormality is seen.  IMPRESSION: No acute  abnormality noted.   Electronically Signed   By: Alcide Clever M.D.   On: 07/20/2014 13:44     EKG Interpretation None      MDM   Final diagnoses:  Right ankle sprain, initial encounter    3:33 PM Patient's xray unremarkable for acute changes. No neurovascular compromise. Patient will have ASO brace, crutches, and percocet for pain. No other injuries.   I personally performed the services described in this documentation, which was scribed in my presence. The recorded information has been reviewed and is accurate.    Emilia Beck, PA-C 07/20/14 1537

## 2014-07-22 NOTE — ED Provider Notes (Signed)
Medical screening examination/treatment/procedure(s) were performed by non-physician practitioner and as supervising physician I was immediately available for consultation/collaboration.   EKG Interpretation None       Flint Melter, MD 07/22/14 1420

## 2014-07-23 MED ORDER — CLONAZEPAM 1 MG PO TABS: ORAL_TABLET | ORAL | Status: DC

## 2014-07-23 NOTE — Telephone Encounter (Signed)
Dear White Team please call in as below THANKS! [name]  

## 2014-08-16 ENCOUNTER — Telehealth: Payer: Self-pay | Admitting: *Deleted

## 2014-08-16 DIAGNOSIS — F419 Anxiety disorder, unspecified: Secondary | ICD-10-CM

## 2014-08-16 DIAGNOSIS — F411 Generalized anxiety disorder: Secondary | ICD-10-CM

## 2014-08-16 NOTE — Telephone Encounter (Signed)
Pt says she has been having problems with her drug store, last month had a problem receiving refill on her klonopin, pt called it in today but wanted to call us to be sure it was taken care of, would like us to call her when it has been called in. [facility] goes to [facility]. Knox RoyaltyErin Odell

## 2014-08-18 MED ORDER — CLONAZEPAM 1 MG PO TABS: ORAL_TABLET | ORAL | Status: DC

## 2014-08-18 NOTE — Telephone Encounter (Signed)
Dear Cliffton AstersWhite Team Please call this pharmacy and make sure they have this called in w refills #5 THANKS! Denny LevySara Neal

## 2014-08-18 NOTE — Telephone Encounter (Signed)
Ma'am, called patient  and she picked up medication from pharmacy on the 5 th of October with no problems. Thanks, Glennie HawkSimpson, Michelle R

## 2014-08-27 ENCOUNTER — Other Ambulatory Visit: Payer: Self-pay

## 2014-09-01 ENCOUNTER — Ambulatory Visit: Payer: Self-pay | Admitting: Family Medicine

## 2014-09-22 ENCOUNTER — Ambulatory Visit: Payer: Self-pay | Admitting: Family Medicine

## 2014-09-29 ENCOUNTER — Ambulatory Visit: Payer: Self-pay | Admitting: Family Medicine

## 2014-10-06 ENCOUNTER — Ambulatory Visit (INDEPENDENT_AMBULATORY_CARE_PROVIDER_SITE_OTHER): Payer: Medicaid Other | Admitting: Family Medicine

## 2014-10-06 ENCOUNTER — Encounter: Payer: Self-pay | Admitting: Family Medicine

## 2014-10-06 VITALS — BP 117/76 | HR 84 | Temp 97.5°F | Ht 62.0 in | Wt 173.0 lb

## 2014-10-06 DIAGNOSIS — F1210 Cannabis abuse, uncomplicated: Secondary | ICD-10-CM

## 2014-10-06 DIAGNOSIS — M25561 Pain in right knee: Secondary | ICD-10-CM

## 2014-10-06 DIAGNOSIS — J4530 Mild persistent asthma, uncomplicated: Secondary | ICD-10-CM

## 2014-10-06 DIAGNOSIS — F319 Bipolar disorder, unspecified: Secondary | ICD-10-CM

## 2014-10-06 DIAGNOSIS — E030 Congenital hypothyroidism with diffuse goiter: Secondary | ICD-10-CM

## 2014-10-06 DIAGNOSIS — G894 Chronic pain syndrome: Secondary | ICD-10-CM

## 2014-10-06 LAB — TSH: TSH: 1.02 u[IU]/mL (ref 0.350–4.500)

## 2014-10-06 NOTE — Assessment & Plan Note (Signed)
The only medicine that she is taking regularly as her Klonopin. She is due for a refill on that this Friday but with the Thanksgiving holiday she wonders if I would be willing to call the pharmacy and see if they would refill it 2 days early. We called the pharmacy and approved refill 2 days early. Her mood seems to be stable. She does not want to take any other psychotropic medications include any mood stabilizers.

## 2014-10-06 NOTE — Assessment & Plan Note (Signed)
Recheck TSH today. No medication changes at this time.

## 2014-10-06 NOTE — Assessment & Plan Note (Signed)
I will give her referral to the pain management Center she has requested.
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
## 2014-10-06 NOTE — Assessment & Plan Note (Signed)
Still smoking cigarettes. Reports she has discontinued smoking marijuana. Refilled her inhalers. I'm not sure how regularly she is using them. We briefly discussed smoking cessation but she is in the pre-contemplative. At this time. I'll see her back in 2-3 months.

## 2014-10-11 ENCOUNTER — Telehealth: Payer: Self-pay | Admitting: *Deleted

## 2014-10-11 NOTE — Telephone Encounter (Signed)
Pt is checking status of referral to [facility] (on [place])  Would like a call back when this is done. [name], Melinda RochesterJessica Brady

## 2014-10-11 NOTE — Telephone Encounter (Signed)
Left message for patient to return call. Please tell her I have faxed the referral request and they will call her with the appointment information.Nghia Mcentee, Rodena Medinobert Lee

## 2014-10-12 NOTE — Telephone Encounter (Signed)
LVM for patient tot call back to inform her of below

## 2014-10-13 ENCOUNTER — Telehealth: Payer: Self-pay | Admitting: Family Medicine

## 2014-10-13 MED ORDER — LORATADINE-PSEUDOEPHEDRINE ER 10-240 MG PO TB24: 1.0000 | ORAL_TABLET | Freq: Every day | ORAL | Status: DC

## 2014-10-13 NOTE — Telephone Encounter (Signed)
Pt has deep chest cough and nasal congestion. Daughter has the same thing and went to dr yesterday.  Daughter was given amoxicillian. Pt wants to know if dr could call in antibotic and cough medicine Please advise

## 2014-10-13 NOTE — Telephone Encounter (Signed)
Spoke with patient and gave below message 

## 2014-10-13 NOTE — Telephone Encounter (Signed)
LVM for patient to call back to inform her of below message 

## 2014-10-13 NOTE — Telephone Encounter (Signed)
Spoke with patient she states that her daughter was put on amoxicillin and Clartitn-D for a cough and congestion that started yesterday. Patient has had a cough and congestion for 5 days now, she is wanting to know if we can do the same for her.

## 2014-10-14 MED ORDER — AMOXICILLIN 500 MG PO CAPS: 500.0000 mg | ORAL_CAPSULE | Freq: Three times a day (TID) | ORAL | Status: DC

## 2014-10-14 NOTE — Telephone Encounter (Signed)
Ok i have done this plz let herknow THANKS! Denny LevySara Dreyah Montrose

## 2014-10-14 NOTE — Telephone Encounter (Signed)
LVM for patient to call back to inform her of below 

## 2014-10-15 ENCOUNTER — Encounter: Payer: Self-pay | Admitting: Family Medicine

## 2014-11-01 ENCOUNTER — Other Ambulatory Visit: Payer: Self-pay | Admitting: Family Medicine

## 2014-11-01 NOTE — Telephone Encounter (Signed)
LVM for patient to call back to inform her of below 

## 2014-11-01 NOTE — Telephone Encounter (Signed)
Pt would like the doctor to call the pharmacy and tell them that she can go ahead a get her refill on Klonopin. They are giving her refills without the doctor calling them. She has 5 refills on the medication. jw

## 2014-11-01 NOTE — Telephone Encounter (Signed)
Dear Cliffton AstersWhite Team This makes no sense--I refilled her klonopin---sheget #90 pills---she can only refill on SCHEDULE meaning once every 30 days--is that the problem? NO early refills THANKS! Denny LevySara Rafferty Postlewait

## 2014-12-06 ENCOUNTER — Other Ambulatory Visit: Payer: Self-pay | Admitting: Family Medicine

## 2014-12-06 NOTE — Telephone Encounter (Signed)
Pt called and would like some medication for scabies. Her grandson and daughter have this and now she does. Please call patient when you call in it so she knows to pick this up. jw

## 2014-12-07 MED ORDER — PERMETHRIN 5 % EX CREA: 1.0000 "application " | TOPICAL_CREAM | Freq: Once | CUTANEOUS | Status: DC

## 2014-12-07 NOTE — Telephone Encounter (Signed)
LVM for patient to call back to inform her of below 

## 2014-12-07 NOTE — Telephone Encounter (Signed)
Ok I have called in medicine for her--it is for adults ONLY--not for children less than 1 yoa THANKS! Melinda LevySara Latorie Brady

## 2014-12-21 ENCOUNTER — Other Ambulatory Visit: Payer: Self-pay | Admitting: Family Medicine

## 2014-12-21 NOTE — Telephone Encounter (Signed)
Pt called because she is in [place] and forgot her medication. She hasn't taken her Synthroid in 5 days. Can we call in a refill to the [facility] at [facility]jw

## 2014-12-23 NOTE — Telephone Encounter (Signed)
Dear Cliffton AstersWhite Team I have this cued up to send but I am not sure [facility] is the [facility]. i do not see one on [address]. See if you can ascertain correct pharmacy---  also--just FYI for her--that is a medicine that she can miss for up to 7-10 days without any terrible effects so if she is coming home soon--no need to get another rx.

## 2014-12-27 ENCOUNTER — Telehealth: Payer: Self-pay | Admitting: Family Medicine

## 2014-12-27 DIAGNOSIS — F419 Anxiety disorder, unspecified: Secondary | ICD-10-CM

## 2014-12-27 DIAGNOSIS — F411 Generalized anxiety disorder: Secondary | ICD-10-CM

## 2014-12-27 MED ORDER — LEVOTHYROXINE SODIUM 137 MCG PO TABS: 137.0000 ug | ORAL_TABLET | Freq: Every day | ORAL | Status: DC

## 2014-12-27 MED ORDER — CLONAZEPAM 1 MG PO TABS: ORAL_TABLET | ORAL | Status: DC

## 2014-12-27 NOTE — Telephone Encounter (Signed)
Patient called the after-hours line today as the clinic is closed. Patient requesting refill on Klonopin as she is going out of town.  Given the acuity of her situation and the fact that she is at high risk for withdrawal, I refilled her Klonopin 1 mg 3 times a day #21 (seven-day supply). Of note, patient appears to be refilling her medications early according to the pharmacy.

## 2014-12-27 NOTE — Telephone Encounter (Signed)
Paged to [facility] Emergency Line by patient [name], referencing similar complaint to earlier Emergency Line page fielded by Dr. Adriana Simasook on 12/27/14. She reports that her Klonopin 1mg  TID for anxiety was refilled as requested, but she was concerned that it was only for 7 days (#21 tablets, 0 refills) and she is going out of town to [place] to visit sick family member, she is now requesting for a total of 1 month supply (total #90 tablets) just to make sure that she does not run out, as she is planning to stay for > 1 week. As stated in Dr. Patsey Bertholdook's note, she would be concerned for high risk for withdrawal off of BDZ.  I reviewed her chart, and agreed to phone in remaining #69 tablets 0 refills Klonopin 1mg  tabs for total 1 month supply at this time. Phoned rx into [facility], spoke with pharmacist who stated that since she has already picked up the 21 tablet rx earlier today, her insurance would not cover this until about 6 days were up and she would be due for next refill. Confirmed that #69 Klonopin 1mg  tablets would cost $23 out of pocket. Confirmed this with patient, phoned in additional refill for #69 tablets, to make total #90 filled today. Patient understands and will pick-up rx today, follow-up with PCP Dr. Jennette KettleNeal as needed following her trip and she would likely need to have a office visit prior to future Klonopin rx's filled.  Saralyn PilarAlexander Besnik Febus, DO [facility], PGY-2

## 2015-01-10 ENCOUNTER — Other Ambulatory Visit: Payer: Self-pay | Admitting: *Deleted

## 2015-01-11 MED ORDER — AMOXICILLIN 500 MG PO CAPS: 500.0000 mg | ORAL_CAPSULE | Freq: Three times a day (TID) | ORAL | Status: DC

## 2015-03-16 ENCOUNTER — Telehealth: Payer: Self-pay | Admitting: Family Medicine

## 2015-03-16 DIAGNOSIS — F411 Generalized anxiety disorder: Secondary | ICD-10-CM

## 2015-03-16 NOTE — Telephone Encounter (Signed)
Please call  Pt when this has been done

## 2015-03-16 NOTE — Telephone Encounter (Signed)
Needs refill on clonazepam [facility]

## 2015-03-17 NOTE — Telephone Encounter (Signed)
Wants this refilled now-" "her anxiety is running over 100" hasnt taken meds in over 2 months but there is too much stuff going on in her life Please call her when the refill is called in

## 2015-03-18 ENCOUNTER — Encounter: Payer: Self-pay | Admitting: Family Medicine

## 2015-03-18 NOTE — Telephone Encounter (Signed)
Phone call witnessed by Altamese DillingJeannette Richardson RN

## 2015-03-18 NOTE — Telephone Encounter (Signed)
Pt called and I discussed with Dr. Jennette KettleNeal what was said and I set her an upcoming appt to discuss further. Lynford Humphrey/Thanks Dorothey BasemanSadie Reynolds, ASA

## 2015-03-18 NOTE — Telephone Encounter (Signed)
I ran a [place] drug database search--notably she has been getting regular rx for opiates from [facility] as well as one from a dentist and at an ED [facility]

## 2015-03-18 NOTE — Telephone Encounter (Signed)
i called [name] to discuss my recent findiings on the [place] database for controlled substances. .I was totally unaware she was getting chronic narcotic therapy from another provider. The combination of the clonazepam and oxycodone could be deadly.   She did not answer her phone and there is no other contact number so Ileft her a voice mail telling her I had run the database search and the findings.  I told her since she was getting regular pain medicines from another physician(s) I would NO LONGER be Rx her clonazepam or any other controlled substance. I also told her that if she had questions, she would need to make an appointment to see me in my clinic.   As Wellsite geologistmedical director and in line with our clinic's current policies regarding controlled substance prescribing, I am going to note on her chart that this cliinc will not be prescribing any further controlled substance. We will be happy to continue her medical care for other issues. I will also send her a registered letter to inform her of these issues in case she does not get her  Voice mail.

## 2015-04-06 ENCOUNTER — Ambulatory Visit (INDEPENDENT_AMBULATORY_CARE_PROVIDER_SITE_OTHER): Payer: Medicaid Other | Admitting: Family Medicine

## 2015-04-06 ENCOUNTER — Encounter: Payer: Self-pay | Admitting: Family Medicine

## 2015-04-06 VITALS — BP 119/75 | HR 83 | Temp 97.7°F | Ht 62.0 in | Wt 162.2 lb

## 2015-04-06 DIAGNOSIS — F411 Generalized anxiety disorder: Secondary | ICD-10-CM

## 2015-04-06 DIAGNOSIS — J449 Chronic obstructive pulmonary disease, unspecified: Secondary | ICD-10-CM | POA: Diagnosis not present

## 2015-04-06 MED ORDER — ALBUTEROL SULFATE HFA 108 (90 BASE) MCG/ACT IN AERS: INHALATION_SPRAY | RESPIRATORY_TRACT | Status: DC

## 2015-04-06 MED ORDER — ALBUTEROL SULFATE (2.5 MG/3ML) 0.083% IN NEBU: 2.5000 mg | INHALATION_SOLUTION | Freq: Four times a day (QID) | RESPIRATORY_TRACT | Status: DC | PRN

## 2015-04-06 MED ORDER — FLUTICASONE-SALMETEROL 250-50 MCG/DOSE IN AEPB: 1.0000 | INHALATION_SPRAY | Freq: Two times a day (BID) | RESPIRATORY_TRACT | Status: DC

## 2015-04-07 NOTE — Progress Notes (Signed)
   Subjective:    Patient ID: [name], female    DOB: June 05, 1971, 44 y.o.   MRN: [id number]  HPI She is here to discuss my decision to discontinue her benzodiazepine's.   Review of Systems     Objective:   Physical Exam  Patient is in no acute distress. HEENT: Noticeable bilateral proptosis PSYCH: Alert and oriented 4. Speech is normal in content and fluency. There is no pressured speech. She asks answers questions appropriately. She does seem somewhat angry at times.      Assessment & Plan:  Anxiety previously on long-term benzodiazepine's. See my note from 03/18/2015. We will no longer be prescribing her benzodiazepine's. We will not prescribe any controlled substances. We will be happy to see her for her other medical issues. I did refill some of her inhalers today for her asthma.

## 2015-06-27 ENCOUNTER — Telehealth: Payer: Self-pay | Admitting: *Deleted

## 2015-06-27 MED ORDER — LEVOTHYROXINE SODIUM 137 MCG PO TABS: 137.0000 ug | ORAL_TABLET | Freq: Every day | ORAL | Status: DC

## 2015-06-27 NOTE — Telephone Encounter (Signed)
Refill completed.  Martin, Tamika L, RN  

## 2015-08-12 ENCOUNTER — Other Ambulatory Visit: Payer: Self-pay | Admitting: *Deleted

## 2015-08-12 MED ORDER — LEVOTHYROXINE SODIUM 137 MCG PO TABS: 137.0000 ug | ORAL_TABLET | Freq: Every day | ORAL | Status: DC

## 2015-09-14 ENCOUNTER — Other Ambulatory Visit: Payer: Self-pay | Admitting: *Deleted

## 2015-09-14 MED ORDER — LEVOTHYROXINE SODIUM 137 MCG PO TABS: 137.0000 ug | ORAL_TABLET | Freq: Every day | ORAL | Status: DC

## 2015-10-31 ENCOUNTER — Other Ambulatory Visit: Payer: Self-pay | Admitting: Family Medicine

## 2015-10-31 NOTE — Telephone Encounter (Signed)
Pt called and would like to see if Dr. Jennette KettleNeal could call her something in for a cough. jw

## 2015-11-01 MED ORDER — BENZONATATE 100 MG PO CAPS: 100.0000 mg | ORAL_CAPSULE | Freq: Three times a day (TID) | ORAL | Status: DC | PRN

## 2015-11-01 NOTE — Telephone Encounter (Signed)
LM for pt to call the office. If pt calls, please inform her of the note below. Sunday SpillersSharon T Saunders, CMA

## 2015-11-01 NOTE — Telephone Encounter (Signed)
Dear Melinda AstersWhite Team I have called in something. Melinda LevySara Lucillie Brady

## 2015-11-22 ENCOUNTER — Telehealth: Payer: Self-pay | Admitting: Family Medicine

## 2015-11-22 MED ORDER — GUAIFENESIN 400 MG PO TABS: 400.0000 mg | ORAL_TABLET | Freq: Four times a day (QID) | ORAL | Status: DC | PRN

## 2015-11-22 NOTE — Telephone Encounter (Signed)
Would like Dr Jennette Kettleneal to call in another cough medicine that is covered by her insurance. The one dr neal called in is not covered by her insurance and pt doesn't have the cash to pay for it. w

## 2015-12-25 ENCOUNTER — Emergency Department (HOSPITAL_COMMUNITY): Payer: Medicaid Other

## 2015-12-25 ENCOUNTER — Encounter (HOSPITAL_COMMUNITY): Payer: Self-pay

## 2015-12-25 ENCOUNTER — Emergency Department (HOSPITAL_COMMUNITY)
Admission: EM | Admit: 2015-12-25 | Discharge: 2015-12-25 | Disposition: A | Discharge status: Home / Self Care | Payer: Medicaid Other | Attending: Emergency Medicine | Admitting: Emergency Medicine

## 2015-12-25 DIAGNOSIS — S298XXA Other specified injuries of thorax, initial encounter: Secondary | ICD-10-CM

## 2015-12-25 DIAGNOSIS — Z87448 Personal history of other diseases of urinary system: Secondary | ICD-10-CM | POA: Insufficient documentation

## 2015-12-25 DIAGNOSIS — S20211A Contusion of right front wall of thorax, initial encounter: Secondary | ICD-10-CM | POA: Insufficient documentation

## 2015-12-25 DIAGNOSIS — Y998 Other external cause status: Secondary | ICD-10-CM | POA: Insufficient documentation

## 2015-12-25 DIAGNOSIS — S29001A Unspecified injury of muscle and tendon of front wall of thorax, initial encounter: Secondary | ICD-10-CM | POA: Diagnosis present

## 2015-12-25 DIAGNOSIS — J441 Chronic obstructive pulmonary disease with (acute) exacerbation: Secondary | ICD-10-CM | POA: Insufficient documentation

## 2015-12-25 DIAGNOSIS — F17210 Nicotine dependence, cigarettes, uncomplicated: Secondary | ICD-10-CM | POA: Diagnosis not present

## 2015-12-25 DIAGNOSIS — E0500 Thyrotoxicosis with diffuse goiter without thyrotoxic crisis or storm: Secondary | ICD-10-CM | POA: Diagnosis not present

## 2015-12-25 DIAGNOSIS — X58XXXA Exposure to other specified factors, initial encounter: Secondary | ICD-10-CM | POA: Diagnosis not present

## 2015-12-25 DIAGNOSIS — Z79899 Other long term (current) drug therapy: Secondary | ICD-10-CM | POA: Insufficient documentation

## 2015-12-25 DIAGNOSIS — Z8659 Personal history of other mental and behavioral disorders: Secondary | ICD-10-CM | POA: Diagnosis not present

## 2015-12-25 DIAGNOSIS — Y9289 Other specified places as the place of occurrence of the external cause: Secondary | ICD-10-CM | POA: Diagnosis not present

## 2015-12-25 DIAGNOSIS — Z872 Personal history of diseases of the skin and subcutaneous tissue: Secondary | ICD-10-CM | POA: Insufficient documentation

## 2015-12-25 DIAGNOSIS — Y9389 Activity, other specified: Secondary | ICD-10-CM | POA: Insufficient documentation

## 2015-12-25 MED ORDER — OXYCODONE-ACETAMINOPHEN 5-325 MG PO TABS: 2.0000 | ORAL_TABLET | ORAL | Status: DC | PRN

## 2015-12-25 MED ORDER — OXYCODONE-ACETAMINOPHEN 5-325 MG PO TABS
1.0000 | ORAL_TABLET | Freq: Once | ORAL | Status: AC
  Administered 2015-12-25: 1 via ORAL
  Filled 2015-12-25: qty 1
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
## 2015-12-25 NOTE — Discharge Instructions (Signed)
Rib Contusion A rib contusion is a deep bruise on your rib area. Contusions are the result of a blunt trauma that causes bleeding and injury to the tissues under the skin. A rib contusion may involve bruising of the ribs and of the skin and muscles in the area. The skin overlying the contusion may turn blue, purple, or yellow. Minor injuries will give you a painless contusion, but more severe contusions may stay painful and swollen for a few weeks. CAUSES  A contusion is usually caused by a blow, trauma, or direct force to an area of the body. This often occurs while playing contact sports. SYMPTOMS  Swelling and redness of the injured area.  Discoloration of the injured area.  Tenderness and soreness of the injured area.  Pain with or without movement. DIAGNOSIS  The diagnosis can be made by taking a medical history and performing a physical exam. An X-ray, CT scan, or MRI may be needed to determine if there were any associated injuries, such as broken bones (fractures) or internal injuries. TREATMENT  Often, the best treatment for a rib contusion is rest. Icing or applying cold compresses to the injured area may help reduce swelling and inflammation. Deep breathing exercises may be recommended to reduce the risk of partial lung collapse and pneumonia. Over-the-counter or prescription medicines may also be recommended for pain control. HOME CARE INSTRUCTIONS   Apply ice to the injured area:  Put ice in a plastic bag.  Place a towel between your skin and the bag.  Leave the ice on for 20 minutes, 2-3 times per day.  Take medicines only as directed by your health care provider.  Rest the injured area. Avoid strenuous activity and any activities or movements that cause pain. Be careful during activities and avoid bumping the injured area.  Perform deep-breathing exercises as directed by your health care provider.  Do not lift anything that is heavier than 5 lb (2.3 kg) until your  health care provider approves.  Do not use any tobacco products, including cigarettes, chewing tobacco, or electronic cigarettes. If you need help quitting, ask your health care provider. SEEK MEDICAL CARE IF:   You have increased bruising or swelling.  You have pain that is not controlled with treatment.  You have a fever. SEEK IMMEDIATE MEDICAL CARE IF:   You have difficulty breathing or shortness of breath.  You develop a continual cough, or you cough up thick or bloody sputum.  You feel sick to your stomach (nauseous), you throw up (vomit), or you have abdominal pain.   This information is not intended to replace advice given to you by your health care provider. Make sure you discuss any questions you have with your health care provider.   Document Released: 07/24/2001 Document Revised: 11/19/2014 Document Reviewed: 08/10/2014 Elsevier Interactive Patient Education 2016 [employer]  Cryotherapy Cryotherapy is when you put ice on your injury. Ice helps lessen pain and puffiness (swelling) after an injury. Ice works the best when you start using it in the first 24 to 48 hours after an injury. HOME CARE  Put a dry or damp towel between the ice pack and your skin.  You may press gently on the ice pack.  Leave the ice on for no more than 10 to 20 minutes at a time.  Check your skin after 5 minutes to make sure your skin is okay.  Rest at least 20 minutes between ice pack uses.  Stop using ice when your skin  loses feeling (numbness).  Do not use ice on someone who cannot tell you when it hurts. This includes small children and people with memory problems (dementia). GET HELP RIGHT AWAY IF:  You have white spots on your skin.  Your skin turns blue or pale.  Your skin feels waxy or hard.  Your puffiness gets worse. MAKE SURE YOU:   Understand these instructions.  Will watch your condition.  Will get help right away if you are not doing well or get worse.   This  information is not intended to replace advice given to you by your health care provider. Make sure you discuss any questions you have with your health care provider.   Follow up with PCP if your symptoms do not improve. Use incentive spirometer as indicated. Take pain medication and ibuprofen as needed for pain. Return to the ED if you experience difficulty breathing, severe increase in your pain, fever, wheezing.
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
## 2015-12-26 LAB — I-STAT CHEM 8, ED
BUN: 14 mg/dL (ref 6–20)
CREATININE: 0.7 mg/dL (ref 0.44–1.00)
Calcium, Ion: 1.16 mmol/L (ref 1.12–1.23)
Chloride: 102 mmol/L (ref 101–111)
Glucose, Bld: 80 mg/dL (ref 65–99)
HCT: 45 % (ref 36.0–46.0)
HEMOGLOBIN: 15.3 g/dL — AB (ref 12.0–15.0)
Potassium: 3.8 mmol/L (ref 3.5–5.1)
SODIUM: 140 mmol/L (ref 135–145)
TCO2: 22 mmol/L (ref 0–100)

## 2015-12-26 LAB — I-STAT TROPONIN, ED: TROPONIN I, POC: 0 ng/mL (ref 0.00–0.08)

## 2015-12-28 ENCOUNTER — Telehealth: Payer: Self-pay | Admitting: Family Medicine

## 2015-12-28 NOTE — Telephone Encounter (Signed)
Pt went to ED Sunday with rib fracture. She was given pain meds but only 5.  She is in tremendous pain.   She has COPD and it hurts to breathe.  She doesn't know what to do

## 2015-12-28 NOTE — Telephone Encounter (Signed)
Called again about pain meds

## 2015-12-29 MED ORDER — CYCLOBENZAPRINE HCL 10 MG PO TABS: 10.0000 mg | ORAL_TABLET | Freq: Three times a day (TID) | ORAL | Status: DC | PRN

## 2015-12-29 NOTE — Telephone Encounter (Signed)
Dear Cliffton Asters Team I have called in some muscle relaxers. Hopefully that will help..( I do not use narcotics for rib fractures) Please let her know THANKS! Denny Levy

## 2015-12-29 NOTE — Telephone Encounter (Signed)
Pt states she cannot take Flexril because they make her legs run at night.  So what next?  If she doesn't hear something, she will go back to the ED

## 2015-12-30 NOTE — Telephone Encounter (Signed)
Dear Cliffton Asters Team I can call in some other type of muscle relaxer if she wants If she does, call in  Lioresal 10 mg, 1 po tid prn pain, #30, no refills. THANKS! Denny Levy

## 2015-12-30 NOTE — Telephone Encounter (Signed)
Contacted pt and she said she is in a lot of pain and I asked if she wanted me to call in another muscle relaxer and she stated no it wouldn't help and wanted to see again about getting some pain medication. Lamonte Sakai, [name], [place]

## 2016-01-02 NOTE — Telephone Encounter (Signed)
LVM for pt to call back to inform her of below. Zimmerman Rumple, [name], CMA  

## 2016-01-02 NOTE — Telephone Encounter (Signed)
Dear Cliffton Asters Team I do not rx narcotics for rib fractures. Sorry . I can give her some meloxiam--sort of like ibuprofen--that might help THANKS! Denny Levy

## 2016-01-03 NOTE — Telephone Encounter (Signed)
Pt returned call and i informed her of below and she asked what is she supposed to do, "go back to the emergency room?",  She said she has already tried ibuprofen and it didn't help.  She wanted to see if Dr. Jennette Kettle could prescribe her Klonopin since she is no longer under pain management. I told her i would forward to PCP. Lamonte Sakai, [name], [place]

## 2016-01-03 NOTE — Telephone Encounter (Signed)
Second attempt to reach pt to inform her of below. LVM for her to call back. [name], [name], [place]

## 2016-01-04 ENCOUNTER — Telehealth: Payer: Self-pay | Admitting: Family Medicine
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
## 2016-01-04 NOTE — Telephone Encounter (Signed)
Patient is wanting klonopin called in.  She says that she is no longer in pain management and that she is on the verge of having a nervous breakdown.  She is requesting to speak to someone asap.

## 2016-01-17 ENCOUNTER — Telehealth: Payer: Self-pay | Admitting: Family Medicine

## 2016-01-17 MED ORDER — LEVOTHYROXINE SODIUM 137 MCG PO TABS: 137.0000 ug | ORAL_TABLET | Freq: Every day | ORAL | Status: DC

## 2016-01-17 NOTE — Telephone Encounter (Signed)
Pt called because she needs a refill on her Levothyroxine called in. jw °

## 2016-02-14 ENCOUNTER — Telehealth: Payer: Self-pay | Admitting: Family Medicine

## 2016-02-14 NOTE — Telephone Encounter (Signed)
Pt is calling for a refill on her Levothyroxine. She has her appointment but it is not until 03/21/16 one of the first appointment available. She will be out of medication before then. jw

## 2016-02-15 MED ORDER — LEVOTHYROXINE SODIUM 137 MCG PO TABS: 137.0000 ug | ORAL_TABLET | Freq: Every day | ORAL | Status: DC

## 2016-03-19 ENCOUNTER — Telehealth: Payer: Self-pay | Admitting: Family Medicine

## 2016-03-19 DIAGNOSIS — J449 Chronic obstructive pulmonary disease, unspecified: Secondary | ICD-10-CM

## 2016-03-19 MED ORDER — LEVOTHYROXINE SODIUM 137 MCG PO TABS: 137.0000 ug | ORAL_TABLET | Freq: Every day | ORAL | Status: DC

## 2016-03-19 MED ORDER — ALBUTEROL SULFATE HFA 108 (90 BASE) MCG/ACT IN AERS: INHALATION_SPRAY | RESPIRATORY_TRACT | Status: DC

## 2016-03-19 NOTE — Telephone Encounter (Signed)
Pt is calling because she had an appointment for 03/21/16 but, we rescheduled this to the 17 th since the doctor was not going to be here. She will be out of medication before then since it is a week later. Can we send in refills on her Levothyroxine and her inhaler. jw

## 2016-03-21 ENCOUNTER — Ambulatory Visit: Payer: Self-pay | Admitting: Family Medicine

## 2016-03-28 ENCOUNTER — Ambulatory Visit: Payer: Self-pay | Admitting: Family Medicine

## 2016-05-10 ENCOUNTER — Telehealth: Payer: Self-pay | Admitting: *Deleted

## 2016-05-10 DIAGNOSIS — J449 Chronic obstructive pulmonary disease, unspecified: Secondary | ICD-10-CM

## 2016-05-10 NOTE — Telephone Encounter (Signed)
Patient needs refills on thyroid medication, albuterol nebulizer solution , and inhaler. Patient states she also needs our office to call her opthalmology office to authorize her visits. [facility] for [facility] graves disease. Ph# [phone]

## 2016-05-11 MED ORDER — LEVOTHYROXINE SODIUM 137 MCG PO TABS: 137.0000 ug | ORAL_TABLET | Freq: Every day | ORAL | Status: DC

## 2016-05-11 MED ORDER — ALBUTEROL SULFATE HFA 108 (90 BASE) MCG/ACT IN AERS: INHALATION_SPRAY | RESPIRATORY_TRACT | Status: DC

## 2016-05-11 MED ORDER — ALBUTEROL SULFATE (2.5 MG/3ML) 0.083% IN NEBU: 2.5000 mg | INHALATION_SOLUTION | Freq: Four times a day (QID) | RESPIRATORY_TRACT | Status: DC | PRN

## 2016-05-11 NOTE — Telephone Encounter (Signed)
Dear Cliffton AstersWhite Team I gave her one month refills on her synthroid I need her to give e somemore info re the referral--like what kind of insurance does she have that she needs a referral? AND she needs an appointment with me before any more refills I have not seen her in 6 months THANKS! Denny LevySara Drakkar Medeiros

## 2016-05-14 ENCOUNTER — Encounter (HOSPITAL_COMMUNITY): Payer: Self-pay | Admitting: Emergency Medicine

## 2016-05-14 ENCOUNTER — Emergency Department (HOSPITAL_COMMUNITY): Payer: Medicaid Other

## 2016-05-14 ENCOUNTER — Emergency Department (HOSPITAL_COMMUNITY)
Admission: EM | Admit: 2016-05-14 | Discharge: 2016-05-14 | Disposition: A | Discharge status: Home / Self Care | Payer: Medicaid Other | Attending: Emergency Medicine | Admitting: Emergency Medicine

## 2016-05-14 DIAGNOSIS — F17200 Nicotine dependence, unspecified, uncomplicated: Secondary | ICD-10-CM

## 2016-05-14 DIAGNOSIS — R0602 Shortness of breath: Secondary | ICD-10-CM | POA: Diagnosis present

## 2016-05-14 DIAGNOSIS — F17210 Nicotine dependence, cigarettes, uncomplicated: Secondary | ICD-10-CM | POA: Diagnosis not present

## 2016-05-14 DIAGNOSIS — F329 Major depressive disorder, single episode, unspecified: Secondary | ICD-10-CM | POA: Insufficient documentation

## 2016-05-14 DIAGNOSIS — R0789 Other chest pain: Secondary | ICD-10-CM

## 2016-05-14 DIAGNOSIS — J441 Chronic obstructive pulmonary disease with (acute) exacerbation: Secondary | ICD-10-CM | POA: Insufficient documentation

## 2016-05-14 DIAGNOSIS — J449 Chronic obstructive pulmonary disease, unspecified: Secondary | ICD-10-CM

## 2016-05-14 LAB — CBC WITH DIFFERENTIAL/PLATELET
BASOS ABS: 0.1 10*3/uL (ref 0.0–0.1)
Basophils Relative: 2 %
Eosinophils Absolute: 0.1 10*3/uL (ref 0.0–0.7)
Eosinophils Relative: 3 %
HEMATOCRIT: 43.6 % (ref 36.0–46.0)
HEMOGLOBIN: 14.7 g/dL (ref 12.0–15.0)
LYMPHS PCT: 37 %
Lymphs Abs: 1.7 10*3/uL (ref 0.7–4.0)
MCH: 31.5 pg (ref 26.0–34.0)
MCHC: 33.7 g/dL (ref 30.0–36.0)
MCV: 93.6 fL (ref 78.0–100.0)
Monocytes Absolute: 0.3 10*3/uL (ref 0.1–1.0)
Monocytes Relative: 6 %
NEUTROS ABS: 2.5 10*3/uL (ref 1.7–7.7)
NEUTROS PCT: 52 %
Platelets: 243 10*3/uL (ref 150–400)
RBC: 4.66 MIL/uL (ref 3.87–5.11)
RDW: 12.6 % (ref 11.5–15.5)
WBC: 4.7 10*3/uL (ref 4.0–10.5)

## 2016-05-14 LAB — BASIC METABOLIC PANEL
ANION GAP: 8 (ref 5–15)
BUN: 11 mg/dL (ref 6–20)
CO2: 23 mmol/L (ref 22–32)
Calcium: 9.1 mg/dL (ref 8.9–10.3)
Chloride: 108 mmol/L (ref 101–111)
Creatinine, Ser: 0.71 mg/dL (ref 0.44–1.00)
GFR calc Af Amer: >60 mL/min (ref >60)
GFR calc non Af Amer: >60 mL/min (ref >60)
Glucose, Bld: 88 mg/dL (ref 65–99)
POTASSIUM: 4.3 mmol/L (ref 3.5–5.1)
SODIUM: 139 mmol/L (ref 135–145)

## 2016-05-14 MED ORDER — IPRATROPIUM-ALBUTEROL 0.5-2.5 (3) MG/3ML IN SOLN
3.0000 mL | Freq: Once | RESPIRATORY_TRACT | Status: AC
  Administered 2016-05-14: 3 mL via RESPIRATORY_TRACT
  Filled 2016-05-14: qty 3

## 2016-05-14 MED ORDER — ALBUTEROL SULFATE (2.5 MG/3ML) 0.083% IN NEBU: 2.5000 mg | INHALATION_SOLUTION | Freq: Four times a day (QID) | RESPIRATORY_TRACT | Status: DC | PRN

## 2016-05-14 MED ORDER — KETOROLAC TROMETHAMINE 30 MG/ML IJ SOLN
30.0000 mg | Freq: Once | INTRAMUSCULAR | Status: AC
  Administered 2016-05-14: 30 mg via INTRAVENOUS
  Filled 2016-05-14: qty 1

## 2016-05-14 MED ORDER — OXYCODONE-ACETAMINOPHEN 5-325 MG PO TABS: 1.0000 | ORAL_TABLET | ORAL | Status: DC | PRN

## 2016-05-14 MED ORDER — MORPHINE SULFATE (PF) 4 MG/ML IV SOLN
4.0000 mg | Freq: Once | INTRAVENOUS | Status: AC
  Administered 2016-05-14: 4 mg via INTRAVENOUS
  Filled 2016-05-14: qty 1

## 2016-05-14 MED ORDER — ONDANSETRON HCL 4 MG/2ML IJ SOLN
4.0000 mg | Freq: Once | INTRAMUSCULAR | Status: AC
  Administered 2016-05-14: 4 mg via INTRAVENOUS
  Filled 2016-05-14: qty 2

## 2016-05-14 MED ORDER — ALBUTEROL SULFATE HFA 108 (90 BASE) MCG/ACT IN AERS: INHALATION_SPRAY | RESPIRATORY_TRACT | Status: DC

## 2016-05-14 MED ORDER — PREDNISONE 50 MG PO TABS: 50.0000 mg | ORAL_TABLET | Freq: Every day | ORAL | Status: DC

## 2016-05-14 MED ORDER — METHYLPREDNISOLONE SODIUM SUCC 125 MG IJ SOLR
125.0000 mg | Freq: Once | INTRAMUSCULAR | Status: AC
  Administered 2016-05-14: 125 mg via INTRAVENOUS
  Filled 2016-05-14: qty 2
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
## 2016-05-14 NOTE — Discharge Instructions (Signed)
Chronic Obstructive Pulmonary Disease Chronic obstructive pulmonary disease (COPD) is a common lung condition in which airflow from the lungs is limited. COPD is a general term that can be used to describe many different lung problems that limit airflow, including both chronic bronchitis and emphysema. If you have COPD, your lung function will probably never return to normal, but there are measures you can take to improve lung function and make yourself feel better. CAUSES   Smoking (common).  Exposure to secondhand smoke.  Genetic problems.  Chronic inflammatory lung diseases or recurrent infections. SYMPTOMS  Shortness of breath, especially with physical activity.  Deep, persistent (chronic) cough with a large amount of thick mucus.  Wheezing.  Rapid breaths (tachypnea).  Gray or bluish discoloration (cyanosis) of the skin, especially in your fingers, toes, or lips.  Fatigue.  Weight loss.  Frequent infections or episodes when breathing symptoms become much worse (exacerbations).  Chest tightness. DIAGNOSIS Your health care provider will take a medical history and perform a physical examination to diagnose COPD. Additional tests for COPD may include:  Lung (pulmonary) function tests.  Chest X-ray.  CT scan.  Blood tests. TREATMENT  Treatment for COPD may include:  Inhaler and nebulizer medicines. These help manage the symptoms of COPD and make your breathing more comfortable.  Supplemental oxygen. Supplemental oxygen is only helpful if you have a low oxygen level in your blood.  Exercise and physical activity. These are beneficial for nearly all people with COPD.  Lung surgery or transplant.  Nutrition therapy to gain weight, if you are underweight.  Pulmonary rehabilitation. This may involve working with a team of health care providers and specialists, such as respiratory, occupational, and physical therapists. HOME CARE INSTRUCTIONS  Take all medicines  (inhaled or pills) as directed by your health care provider.  Avoid over-the-counter medicines or cough syrups that dry up your airway (such as antihistamines) and slow down the elimination of secretions unless instructed otherwise by your health care provider.  If you are a smoker, the most important thing that you can do is stop smoking. Continuing to smoke will cause further lung damage and breathing trouble. Ask your health care provider for help with quitting smoking. He or she can direct you to community resources or hospitals that provide support.  Avoid exposure to irritants such as smoke, chemicals, and fumes that aggravate your breathing.  Use oxygen therapy and pulmonary rehabilitation if directed by your health care provider. If you require home oxygen therapy, ask your health care provider whether you should purchase a pulse oximeter to measure your oxygen level at home.  Avoid contact with individuals who have a contagious illness.  Avoid extreme temperature and humidity changes.  Eat healthy foods. Eating smaller, more frequent meals and resting before meals may help you maintain your strength.  Stay active, but balance activity with periods of rest. Exercise and physical activity will help you maintain your ability to do things you want to do.  Preventing infection and hospitalization is very important when you have COPD. Make sure to receive all the vaccines your health care provider recommends, especially the pneumococcal and influenza vaccines. Ask your health care provider whether you need a pneumonia vaccine.  Learn and use relaxation techniques to manage stress.  Learn and use controlled breathing techniques as directed by your health care provider. Controlled breathing techniques include:  Pursed lip breathing. Start by breathing in (inhaling) through your nose for 1 second. Then, purse your lips as if you were  going to whistle and breathe out (exhale) through the  pursed lips for 2 seconds.  Diaphragmatic breathing. Start by putting one hand on your abdomen just above your waist. Inhale slowly through your nose. The hand on your abdomen should move out. Then purse your lips and exhale slowly. You should be able to feel the hand on your abdomen moving in as you exhale.  Learn and use controlled coughing to clear mucus from your lungs. Controlled coughing is a series of short, progressive coughs. The steps of controlled coughing are: 1. Lean your head slightly forward. 2. Breathe in deeply using diaphragmatic breathing. 3. Try to hold your breath for 3 seconds. 4. Keep your mouth slightly open while coughing twice. 5. Spit any mucus out into a tissue. 6. Rest and repeat the steps once or twice as needed. SEEK MEDICAL CARE IF:  You are coughing up more mucus than usual.  There is a change in the color or thickness of your mucus.  Your breathing is more labored than usual.  Your breathing is faster than usual. SEEK IMMEDIATE MEDICAL CARE IF:  You have shortness of breath while you are resting.  You have shortness of breath that prevents you from:  Being able to talk.  Performing your usual physical activities.  You have chest pain lasting longer than 5 minutes.  Your skin color is more cyanotic than usual.  You measure low oxygen saturations for longer than 5 minutes with a pulse oximeter. MAKE SURE YOU:  Understand these instructions.  Will watch your condition.  Will get help right away if you are not doing well or get worse.   This information is not intended to replace advice given to you by your health care provider. Make sure you discuss any questions you have with your health care provider.   Document Released: 08/08/2005 Document Revised: 11/19/2014 Document Reviewed: 06/25/2013 Elsevier Interactive Patient Education 2016 Elsevier Inc.  Prednisone tablets What is this medicine? PREDNISONE (PRED ni sone) is a  corticosteroid. It is commonly used to treat inflammation of the skin, joints, lungs, and other organs. Common conditions treated include asthma, allergies, and arthritis. It is also used for other conditions, such as blood disorders and diseases of the adrenal glands. This medicine may be used for other purposes; ask your health care provider or pharmacist if you have questions. What should I tell my health care provider before I take this medicine? They need to know if you have any of these conditions: -Cushing's syndrome -diabetes -glaucoma -heart disease -high blood pressure -infection (especially a virus infection such as chickenpox, cold sores, or herpes) -kidney disease -liver disease -mental illness -myasthenia gravis -osteoporosis -seizures -stomach or intestine problems -thyroid disease -an unusual or allergic reaction to lactose, prednisone, other medicines, foods, dyes, or preservatives -pregnant or trying to get pregnant -breast-feeding How should I use this medicine? Take this medicine by mouth with a glass of water. Follow the directions on the prescription label. Take this medicine with food. If you are taking this medicine once a day, take it in the morning. Do not take more medicine than you are told to take. Do not suddenly stop taking your medicine because you may develop a severe reaction. Your doctor will tell you how much medicine to take. If your doctor wants you to stop the medicine, the dose may be slowly lowered over time to avoid any side effects. Talk to your pediatrician regarding the use of this medicine in children. Special care may  be needed. Overdosage: If you think you have taken too much of this medicine contact a poison control center or emergency room at once. NOTE: This medicine is only for you. Do not share this medicine with others. What if I miss a dose? If you miss a dose, take it as soon as you can. If it is almost time for your next dose, talk  to your doctor or health care professional. You may need to miss a dose or take an extra dose. Do not take double or extra doses without advice. What may interact with this medicine? Do not take this medicine with any of the following medications: -metyrapone -mifepristone This medicine may also interact with the following medications: -aminoglutethimide -amphotericin B -aspirin and aspirin-like medicines -barbiturates -certain medicines for diabetes, like glipizide or glyburide -cholestyramine -cholinesterase inhibitors -cyclosporine -digoxin -diuretics -ephedrine -female hormones, like estrogens and birth control pills -isoniazid -ketoconazole -NSAIDS, medicines for pain and inflammation, like ibuprofen or naproxen -phenytoin -rifampin -toxoids -vaccines -warfarin This list may not describe all possible interactions. Give your health care provider a list of all the medicines, herbs, non-prescription drugs, or dietary supplements you use. Also tell them if you smoke, drink alcohol, or use illegal drugs. Some items may interact with your medicine. What should I watch for while using this medicine? Visit your doctor or health care professional for regular checks on your progress. If you are taking this medicine over a prolonged period, carry an identification card with your name and address, the type and dose of your medicine, and your doctor's name and address. This medicine may increase your risk of getting an infection. Tell your doctor or health care professional if you are around anyone with measles or chickenpox, or if you develop sores or blisters that do not heal properly. If you are going to have surgery, tell your doctor or health care professional that you have taken this medicine within the last twelve months. Ask your doctor or health care professional about your diet. You may need to lower the amount of salt you eat. This medicine may affect blood sugar levels. If you  have diabetes, check with your doctor or health care professional before you change your diet or the dose of your diabetic medicine. What side effects may I notice from receiving this medicine? Side effects that you should report to your doctor or health care professional as soon as possible: -allergic reactions like skin rash, itching or hives, swelling of the face, lips, or tongue -changes in emotions or moods -changes in vision -depressed mood -eye pain -fever or chills, cough, sore throat, pain or difficulty passing urine -increased thirst -swelling of ankles, feet Side effects that usually do not require medical attention (report to your doctor or health care professional if they continue or are bothersome): -confusion, excitement, restlessness -headache -nausea, vomiting -skin problems, acne, thin and shiny skin -trouble sleeping -weight gain This list may not describe all possible side effects. Call your doctor for medical advice about side effects. You may report side effects to FDA at 1-800-FDA-1088. Where should I keep my medicine? Keep out of the reach of children. Store at room temperature between 15 and 30 degrees C (59 and 86 degrees F). Protect from light. Keep container tightly closed. Throw away any unused medicine after the expiration date. NOTE: This sheet is a summary. It may not cover all possible information. If you have questions about this medicine, talk to your doctor, pharmacist, or health care provider.  2016, Elsevier/[employer] Standard. (2011-06-14 10:57:14)  Smoking Cessation, Tips for Success If you are ready to quit smoking, congratulations! You have chosen to help yourself be healthier. Cigarettes bring nicotine, tar, carbon monoxide, and other irritants into your body. Your lungs, heart, and blood vessels will be able to work better without these poisons. There are many different ways to quit smoking. Nicotine gum, nicotine patches, a nicotine inhaler, or  nicotine nasal spray can help with physical craving. Hypnosis, support groups, and medicines help break the habit of smoking. WHAT THINGS CAN I DO TO MAKE QUITTING EASIER?  Here are some tips to help you quit for good:  Pick a date when you will quit smoking completely. Tell all of your friends and family about your plan to quit on that date.  Do not try to slowly cut down on the number of cigarettes you are smoking. Pick a quit date and quit smoking completely starting on that day.  Throw away all cigarettes.   Clean and remove all ashtrays from your home, work, and car.  On a card, write down your reasons for quitting. Carry the card with you and read it when you get the urge to smoke.  Cleanse your body of nicotine. Drink enough water and fluids to keep your urine clear or pale yellow. Do this after quitting to flush the nicotine from your body.  Learn to predict your moods. Do not let a bad situation be your excuse to have a cigarette. Some situations in your life might tempt you into wanting a cigarette.  Never have "just one" cigarette. It leads to wanting another and another. Remind yourself of your decision to quit.  Change habits associated with smoking. If you smoked while driving or when feeling stressed, try other activities to replace smoking. Stand up when drinking your coffee. Brush your teeth after eating. Sit in a different chair when you read the paper. Avoid alcohol while trying to quit, and try to drink fewer caffeinated beverages. Alcohol and caffeine may urge you to smoke.  Avoid foods and drinks that can trigger a desire to smoke, such as sugary or spicy foods and alcohol.  Ask people who smoke not to smoke around you.  Have something planned to do right after eating or having a cup of coffee. For example, plan to take a walk or exercise.  Try a relaxation exercise to calm you down and decrease your stress. Remember, you may be tense and nervous for the first 2  weeks after you quit, but this will pass.  Find new activities to keep your hands busy. Play with a pen, coin, or rubber band. Doodle or draw things on paper.  Brush your teeth right after eating. This will help cut down on the craving for the taste of tobacco after meals. You can also try mouthwash.   Use oral substitutes in place of cigarettes. Try using lemon drops, carrots, cinnamon sticks, or chewing gum. Keep them handy so they are available when you have the urge to smoke.  When you have the urge to smoke, try deep breathing.  Designate your home as a nonsmoking area.  If you are a heavy smoker, ask your health care provider about a prescription for nicotine chewing gum. It can ease your withdrawal from nicotine.  Reward yourself. Set aside the cigarette money you save and buy yourself something nice.  Look for support from others. Join a support group or smoking cessation program. Ask someone at home or at  work to help you with your plan to quit smoking.  Always ask yourself, "Do I need this cigarette or is this just a reflex?" Tell yourself, "Today, I choose not to smoke," or "I do not want to smoke." You are reminding yourself of your decision to quit.  Do not replace cigarette smoking with electronic cigarettes (commonly called e-cigarettes). The safety of e-cigarettes is unknown, and some may contain harmful chemicals.  If you relapse, do not give up! Plan ahead and think about what you will do the next time you get the urge to smoke. HOW WILL I FEEL WHEN I QUIT SMOKING? You may have symptoms of withdrawal because your body is used to nicotine (the addictive substance in cigarettes). You may crave cigarettes, be irritable, feel very hungry, cough often, get headaches, or have difficulty concentrating. The withdrawal symptoms are only temporary. They are strongest when you first quit but will go away within 10-14 days. When withdrawal symptoms occur, stay in control. Think about  your reasons for quitting. Remind yourself that these are signs that your body is healing and getting used to being without cigarettes. Remember that withdrawal symptoms are easier to treat than the major diseases that smoking can cause.  Even after the withdrawal is over, expect periodic urges to smoke. However, these cravings are generally short lived and will go away whether you smoke or not. Do not smoke! WHAT RESOURCES ARE AVAILABLE TO HELP ME QUIT SMOKING? Your health care provider can direct you to community resources or hospitals for support, which may include:  Group support.  Education.  Hypnosis.  Therapy.   This information is not intended to replace advice given to you by your health care provider. Make sure you discuss any questions you have with your health care provider.   Document Released: 07/27/2004 Document Revised: 11/19/2014 Document Reviewed: 04/16/2013 Elsevier Interactive Patient Education [employer] Elsevier Inc.

## 2016-05-14 NOTE — ED Notes (Signed)
Patient transported to X-ray 

## 2016-05-14 NOTE — ED Notes (Signed)
Went to get pt for triage, pt in restroom.

## 2016-05-14 NOTE — ED Notes (Signed)
Pt reports SOB, cough and back pain since last night after chasing her dog. Hx of COPD, feels like a flare-up. Denies CP, dizziness or lightheadedness.

## 2016-05-14 NOTE — ED Notes (Signed)
Verbalized understanding discharge instructions, prescriptions, and follow-up. In no acute distress.   

## 2016-05-14 NOTE — ED Notes (Signed)
RN starting line and will draw labs

## 2016-05-16 NOTE — Telephone Encounter (Addendum)
Spoke to pt Appt is scheduled for 7/12. [facility]. [facility] [place] [facility]. Number of visits for number of months. Please advise  Sunday SpillersSharon T Janeliz Prestwood, CMA

## 2016-05-16 NOTE — Telephone Encounter (Signed)
[facility] and [facility] pt for 6 visits. Lamonte SakaiZimmerman Rumple, [name], [place]

## 2016-05-23 ENCOUNTER — Ambulatory Visit (INDEPENDENT_AMBULATORY_CARE_PROVIDER_SITE_OTHER): Payer: Medicaid Other | Admitting: Family Medicine

## 2016-05-23 ENCOUNTER — Encounter: Payer: Self-pay | Admitting: Family Medicine

## 2016-05-23 VITALS — BP 120/81 | HR 84 | Temp 97.5°F | Ht 62.0 in | Wt 140.4 lb

## 2016-05-23 DIAGNOSIS — E038 Other specified hypothyroidism: Secondary | ICD-10-CM

## 2016-05-23 DIAGNOSIS — J449 Chronic obstructive pulmonary disease, unspecified: Secondary | ICD-10-CM

## 2016-05-23 DIAGNOSIS — F319 Bipolar disorder, unspecified: Secondary | ICD-10-CM

## 2016-05-23 DIAGNOSIS — J4530 Mild persistent asthma, uncomplicated: Secondary | ICD-10-CM

## 2016-05-23 DIAGNOSIS — F411 Generalized anxiety disorder: Secondary | ICD-10-CM

## 2016-05-23 MED ORDER — ALBUTEROL SULFATE HFA 108 (90 BASE) MCG/ACT IN AERS: INHALATION_SPRAY | RESPIRATORY_TRACT | Status: DC

## 2016-05-23 MED ORDER — FLUTICASONE-SALMETEROL 250-50 MCG/DOSE IN AEPB: 1.0000 | INHALATION_SPRAY | Freq: Two times a day (BID) | RESPIRATORY_TRACT | Status: DC

## 2016-05-23 NOTE — Assessment & Plan Note (Signed)
Another discussion regarding control medications. I will not prescribe controlled medicines for her.

## 2016-05-23 NOTE — Assessment & Plan Note (Signed)
We'll try adding Advair to her regimen.

## 2016-05-23 NOTE — Progress Notes (Signed)
   Subjective:    Patient ID: [name], female    DOB: 08-19-1971, 45 y.o.   MRN: [id number]  HPI Follow-up hypothyroidism after ablation for Graves' disease. Wants to get her thyroid checked. She's been taking her medicine rarely. She feels somewhat fatigued. She's had no unusual weight change. #2. COPD: She continues to smoke. She would be if sit in adding some type of controller inhaler if it would help. She also needs a refill on her albuterol. #3. Request for into anxiety medication.  Continues to smoke 1 ppd  Review of Systems See history of present illness. Pertinent review of systems: negative for fever, chills, sweats or unusual weight change.     Objective:   Physical Exam  Vital signs reviewed. GENERAL: Well-developed, well-nourished, no acute distress. CARDIOVASCULAR: Regular rate and rhythm no murmur gallop or rub LUNGS: Clear to auscultation bilaterally, no rales or wheeze. ABDOMEN: Soft positive bowel sounds NEURO: No gross focal neurological deficits. MSK: Movement of extremity x 4.        Assessment & Plan:

## 2016-05-23 NOTE — Assessment & Plan Note (Signed)
She reports that she has an appt to see a new psychiatrist's and I encouraged her to keep that appointment.

## 2016-05-24 LAB — TSH: TSH: 1.69 m[IU]/L

## 2016-06-13 ENCOUNTER — Other Ambulatory Visit: Payer: Self-pay | Admitting: *Deleted

## 2016-06-13 MED ORDER — LEVOTHYROXINE SODIUM 137 MCG PO TABS: 137.0000 ug | ORAL_TABLET | Freq: Every day | ORAL | 11 refills | Status: DC

## 2016-06-28 ENCOUNTER — Emergency Department (HOSPITAL_COMMUNITY): Payer: Medicaid Other

## 2016-06-28 ENCOUNTER — Encounter (HOSPITAL_COMMUNITY): Payer: Self-pay | Admitting: Emergency Medicine

## 2016-06-28 ENCOUNTER — Emergency Department (HOSPITAL_COMMUNITY)
Admission: EM | Admit: 2016-06-28 | Discharge: 2016-06-28 | Disposition: A | Discharge status: Home / Self Care | Payer: Medicaid Other | Attending: Emergency Medicine | Admitting: Emergency Medicine

## 2016-06-28 DIAGNOSIS — E039 Hypothyroidism, unspecified: Secondary | ICD-10-CM | POA: Diagnosis not present

## 2016-06-28 DIAGNOSIS — F1290 Cannabis use, unspecified, uncomplicated: Secondary | ICD-10-CM | POA: Insufficient documentation

## 2016-06-28 DIAGNOSIS — J45909 Unspecified asthma, uncomplicated: Secondary | ICD-10-CM | POA: Diagnosis not present

## 2016-06-28 DIAGNOSIS — J449 Chronic obstructive pulmonary disease, unspecified: Secondary | ICD-10-CM | POA: Diagnosis not present

## 2016-06-28 DIAGNOSIS — F17210 Nicotine dependence, cigarettes, uncomplicated: Secondary | ICD-10-CM | POA: Diagnosis not present

## 2016-06-28 DIAGNOSIS — R0789 Other chest pain: Secondary | ICD-10-CM | POA: Insufficient documentation

## 2016-06-28 DIAGNOSIS — R0602 Shortness of breath: Secondary | ICD-10-CM | POA: Diagnosis present

## 2016-06-28 LAB — BASIC METABOLIC PANEL
Anion gap: 8 (ref 5–15)
BUN: 12 mg/dL (ref 6–20)
CHLORIDE: 106 mmol/L (ref 101–111)
CO2: 25 mmol/L (ref 22–32)
CREATININE: 0.89 mg/dL (ref 0.44–1.00)
Calcium: 8.9 mg/dL (ref 8.9–10.3)
Glucose, Bld: 78 mg/dL (ref 65–99)
POTASSIUM: 3.8 mmol/L (ref 3.5–5.1)
SODIUM: 139 mmol/L (ref 135–145)

## 2016-06-28 LAB — CBC
HCT: 44.7 % (ref 36.0–46.0)
Hemoglobin: 15.2 g/dL — ABNORMAL HIGH (ref 12.0–15.0)
MCH: 31.7 pg (ref 26.0–34.0)
MCHC: 34 g/dL (ref 30.0–36.0)
MCV: 93.3 fL (ref 78.0–100.0)
PLATELETS: 262 10*3/uL (ref 150–400)
RBC: 4.79 MIL/uL (ref 3.87–5.11)
RDW: 12.3 % (ref 11.5–15.5)
WBC: 5.9 10*3/uL (ref 4.0–10.5)

## 2016-06-28 LAB — I-STAT TROPONIN, ED: Troponin i, poc: 0 ng/mL (ref 0.00–0.08)

## 2016-06-28 MED ORDER — KETOROLAC TROMETHAMINE 30 MG/ML IJ SOLN
30.0000 mg | Freq: Once | INTRAMUSCULAR | Status: AC
  Administered 2016-06-28: 30 mg via INTRAVENOUS
  Filled 2016-06-28: qty 1

## 2016-06-28 MED ORDER — IPRATROPIUM-ALBUTEROL 0.5-2.5 (3) MG/3ML IN SOLN
3.0000 mL | Freq: Once | RESPIRATORY_TRACT | Status: AC
  Administered 2016-06-28: 3 mL via RESPIRATORY_TRACT
  Filled 2016-06-28: qty 3

## 2016-06-28 MED ORDER — NAPROXEN 500 MG PO TABS: 500.0000 mg | ORAL_TABLET | Freq: Two times a day (BID) | ORAL | 0 refills | Status: DC

## 2016-06-28 MED ORDER — MORPHINE SULFATE (PF) 4 MG/ML IV SOLN
4.0000 mg | Freq: Once | INTRAVENOUS | Status: AC
  Administered 2016-06-28: 4 mg via INTRAVENOUS
  Filled 2016-06-28: qty 1

## 2016-06-28 MED ORDER — TRAMADOL HCL 50 MG PO TABS: 100.0000 mg | ORAL_TABLET | Freq: Four times a day (QID) | ORAL | 0 refills | Status: DC | PRN

## 2016-06-28 NOTE — ED Notes (Signed)
Patient transported to X-ray 

## 2016-06-28 NOTE — ED Provider Notes (Addendum)
[facility]-EMERGENCY DEPT Provider Note   CSN: [id number] Arrival date & time: 06/28/16  1335     History   Chief Complaint Chief Complaint  Patient presents with  . Chest Pain  . Shortness of Breath    HPI [name] is a 45 y.o. female.  HPI Patient states she has been helping a friend remodel her house. She was using a belt sander on the ceiling and felt a sudden pop on the left chest behind her breast. She has had severe pain since that time. It is very painful to take a deep breath. No other associated symptoms. She does have COPD and frequent wheezing. Patient reports she does continue to smoke. She denies prior history of pneumothorax or blood clot. Past Medical History:  Diagnosis Date  . Anxiety   . Bipolar 1 disorder (HCC)   . COPD (chronic obstructive pulmonary disease) (HCC)   . Depression   . Interstitial cystitis   . Renal disorder    intercysticial cystitis.  . Thyroid disease    graves   . Ulcer     Patient Active Problem List   Diagnosis Date Noted  . Chronic pain syndrome 10/06/2014  . 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  . 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 Cocaine abuse 01/28/2012  . Marijuana abuse 01/28/2012  . Knee pain with effusion 06/13/2011  . Allergic rhinitis 05/08/2011  . Asthma 07/31/2010  . PEPTIC ULCER DISEASE 05/23/2010  . COPD with chronic bronchitis (HCC) 01/14/2008  . POSTMENOPAUSAL WITHOUT HORMONE REPLACEMENT THERAPY 10/13/2007  . GRAVES DISEASE 01/09/2007  . Hypothyroidism 01/09/2007  . Bipolar I disorder (HCC) 01/09/2007  . Anxiety state 01/09/2007  . TOBACCO DEPENDENCE 01/09/2007  . 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    Past Surgical History:  Procedure Laterality Date  . ABDOMINAL HYSTERECTOMY    . KNEE ARTHROCENTESIS     right    OB History    No data available       Home Medications    Prior to Admission medications   Medication Sig Start Date End  Date Taking? Authorizing Provider  albuterol (PROAIR HFA) 108 (90 Base) MCG/ACT inhaler INHALE 1-2 PUFFS BY MOUTH EVERY 4-6 HOURS AS NEEDED for shortness of breath 05/23/16  Yes Nestor RampSara L Neal, MD  albuterol (PROVENTIL) (2.5 MG/3ML) 0.083% nebulizer solution Take 3 mLs (2.5 mg total) by nebulization every 6 (six) hours as needed for wheezing or shortness of breath. 05/14/16  Yes Dione Boozeavid Glick, MD  Fluticasone-Salmeterol (ADVAIR) 250-50 MCG/DOSE AEPB Inhale 1 puff into the lungs 2 (two) times daily. 05/23/16  Yes Nestor RampSara L Neal, MD  levothyroxine (SYNTHROID, LEVOTHROID) 137 MCG tablet Take 1 tablet (137 mcg total) by mouth daily. 06/13/16  Yes Nestor RampSara L Neal, MD  naproxen (NAPROSYN) 500 MG tablet Take 1 tablet (500 mg total) by mouth 2 (two) times daily. 06/28/16   Arby BarretteMarcy Denver Bentson, MD  traMADol (ULTRAM) 50 MG tablet Take 2 tablets (100 mg total) by mouth every 6 (six) hours as needed. 06/28/16   Arby BarretteMarcy Mailen Newborn, MD    Family History History reviewed. No pertinent family history.  Social History Social History  Substance Use Topics  . Smoking status: Current Every Day Smoker    Packs/day: 1.00    Types: Cigarettes  . Smokeless tobacco: Not on file  . Alcohol use No     Allergies   Hydrocodone   Review of Systems Review of Systems 10 Systems reviewed and are negative for acute  change except as noted in the HPI.   Physical Exam Updated Vital Signs BP 122/84   Pulse 76   Temp 97.6 F (36.4 C) (Oral)   Resp 16   SpO2 99%   Physical Exam  Constitutional: She appears well-developed and well-nourished. No distress.  HENT:  Head: Normocephalic and atraumatic.  Eyes: Conjunctivae and EOM are normal.  Neck: Neck supple.  Cardiovascular: Normal rate and regular rhythm.   No murmur heard. Pulmonary/Chest: Effort normal. No respiratory distress.  Patient has inspiratory and expiratory wheeze on the left anterior chest wall. Adequate air flow right lung to the base but soft with expiratory wheeze  present. Air flow to the left base sounds slightly more diminished. Patient has severe tenderness to palpation anterior left chest wall at approximately the seventh and eighth ribs. He winces with deep inspiration or palpation. No objective bruising or crepitus.  Abdominal: Soft. She exhibits no distension. There is no tenderness. There is no guarding.  Musculoskeletal: She exhibits no edema or tenderness.  Neurological: She is alert. No cranial nerve deficit. She exhibits normal muscle tone. Coordination normal.  Skin: Skin is warm and dry.  Psychiatric: She has a normal mood and affect.  Nursing note and vitals reviewed.    ED Treatments / Results  Labs (all labs ordered are listed, but only abnormal results are displayed) Labs Reviewed  CBC - Abnormal; Notable for the following:       Result Value   Hemoglobin 15.2 (*)    All other components within normal limits  BASIC METABOLIC PANEL  I-STAT TROPOININ, ED    EKG  EKG Interpretation  Date/Time:  Thursday June 28 2016 13:46:44 EDT Ventricular Rate:  86 PR Interval:    QRS Duration: 117 QT Interval:  292 QTC Calculation: 350 R Axis:   96 Text Interpretation:  Sinus rhythm anterior t wave dynamic compared to previous. slight lateral t wave flattenng compared to old. no STEMI Confirmed by Donnald Garre, MD, Lebron Conners [phone]) on 06/28/2016 1:55:19 PM Also confirmed by Donnald Garre, MD, Lebron Conners [phone]), editor [place], Cala Bradford [phone])  on 06/28/2016 3:26:43 PM       Radiology Dg Chest 2 View  Result Date: 06/28/2016 CLINICAL DATA:  Chest pain and shortness of breath EXAM: CHEST  2 VIEW COMPARISON:  May 14, 2016 FINDINGS: The lungs are clear. The heart size and pulmonary vascularity are normal. No adenopathy. No bone lesions. No pneumothorax. IMPRESSION: No edema or consolidation. Electronically Signed   By: Bretta Bang III M.D.   On: 06/28/2016 14:30    Procedures Procedures (including critical care time)  Medications Ordered in  ED Medications  ipratropium-albuterol (DUONEB) 0.5-2.5 (3) MG/3ML nebulizer solution 3 mL (3 mLs Nebulization Given 06/28/16 1505)  ketorolac (TORADOL) 30 MG/ML injection 30 mg (30 mg Intravenous Given 06/28/16 1521)  morphine 4 MG/ML injection 4 mg (4 mg Intravenous Given 06/28/16 1521)     Initial Impression / Assessment and Plan / ED Course  I have reviewed the triage vital signs and the nursing notes.  Pertinent labs & imaging results that were available during my care of the patient were reviewed by me and considered in my medical decision making (see chart for details).  Clinical Course  Value Comment By Time  DG Chest 2 View (Reviewed) Arby Barrette, MD 08/17 1430    Final Clinical Impressions(s) / ED Diagnoses   Final diagnoses:  Chest wall pain   Patient specifically had pain start with a physical activity. Chest x-ray does not  show pneumothorax. Findings are consistent with chest wall pain. Patient will be treated with anti-inflammatory and tramadol.  16:40 the patient reported that the combination of Toradol and morphine had done nothing to relieve her chest pain. The patient reported that tramadol and naproxen were not effective for pain control for her. She states she has had them and they do not do anything to control her pain. At this time, with no evident fracture and history and findings most consistent with chest wall strain, I felt that this was the appropriate medical management for pain. By EMR, the patient does have a noted history of a chronic pain, alleged diversion of medications and other drug abuse. Due to this, I felt most prudent management would be for what would be typical pain control for this situation and advised the patient to follow up with her primary provider to determine if other narcotic pain control is indicated for this patient. New Prescriptions New Prescriptions   NAPROXEN (NAPROSYN) 500 MG TABLET    Take 1 tablet (500 mg total) by mouth 2 (two)  times daily.   TRAMADOL (ULTRAM) 50 MG TABLET    Take 2 tablets (100 mg total) by mouth every 6 (six) hours as needed.     Arby BarretteMarcy Jaire Pinkham, MD 06/28/16 1611    Arby BarretteMarcy Aasiya Creasey, MD 06/28/16 [phone]

## 2016-06-28 NOTE — ED Triage Notes (Addendum)
Pt began to have L side CP and SOB behind L breast since using a sander on the ceiling yesterday. Felt a "pop" in her chest at one point. No known injury. Has gradually gotten worse throughout the night. Pain worse with deep breath. Tried home nebulizer without relief. Hx of COPD, did smoke recently.

## 2016-06-29 ENCOUNTER — Ambulatory Visit (INDEPENDENT_AMBULATORY_CARE_PROVIDER_SITE_OTHER): Payer: Medicaid Other | Admitting: Internal Medicine

## 2016-06-29 ENCOUNTER — Encounter: Payer: Self-pay | Admitting: Internal Medicine

## 2016-06-29 VITALS — BP 145/90 | HR 80 | Temp 98.4°F

## 2016-06-29 DIAGNOSIS — R0789 Other chest pain: Secondary | ICD-10-CM

## 2016-06-29 DIAGNOSIS — R079 Chest pain, unspecified: Secondary | ICD-10-CM | POA: Insufficient documentation

## 2016-06-29 DIAGNOSIS — J441 Chronic obstructive pulmonary disease with (acute) exacerbation: Secondary | ICD-10-CM

## 2016-06-29 MED ORDER — KETOROLAC TROMETHAMINE 60 MG/2ML IM SOLN: 60.0000 mg | Freq: Once | INTRAMUSCULAR | Status: DC

## 2016-06-29 MED ORDER — KETOROLAC TROMETHAMINE 60 MG/2ML IM SOLN
60.0000 mg | Freq: Once | INTRAMUSCULAR | Status: AC
Start: 2016-06-29 — End: 2016-06-29
  Administered 2016-06-29: 60 mg via INTRAMUSCULAR

## 2016-06-29 MED ORDER — DOXYCYCLINE HYCLATE 100 MG PO TABS: 100.0000 mg | ORAL_TABLET | Freq: Two times a day (BID) | ORAL | 0 refills | Status: DC

## 2016-06-29 MED ORDER — LIDOCAINE 2 % EX GEL: 1.0000 "application " | Freq: Four times a day (QID) | CUTANEOUS | 1 refills | Status: DC | PRN

## 2016-06-29 MED ORDER — PREDNISONE 50 MG PO TABS: ORAL_TABLET | ORAL | 0 refills | Status: DC

## 2016-06-29 NOTE — Assessment & Plan Note (Addendum)
Patient had negative ischemic cardiac work up at ED visit on 8/17 (troponin 0.00 and EKG without ST segment elevation). CXR also normal at ED visit without sign of PNA or pneumothorax. Suspicion for PE low as Well's Score is 0. Benign breast exam. Suspect pain is msk in nature. Per EMR review, patient does have a history of substance abuse and she asked for narcotics at this visit. Per Dr. Donnetta [facility]Neal's [facility] not to [facility]. -Toradol 60 mg injection for pain given at clinic  -lidocaine gel Rx given for local pain control  -COPD exacerbation treated as below  -patient appears to be due for screening mammogram  -f/u with PCP for further discussion of pain control

## 2016-06-29 NOTE — Patient Instructions (Signed)
Take the doxycyline for 7 days and the steroid for 5 days to help with your breathing.   Use the lidocaine over the ribs to help with pain.   Make a follow up with Dr. Jennette KettleNeal to discuss this pain control.

## 2016-06-29 NOTE — Progress Notes (Signed)
Subjective:    [name] - 45 y.o. female MRN [id number]  Date of birth: Sep 16, 1971  HPI  [name] is here for SDA for left chest wall pain.  Left Chest Wall Pain: Pain has been ongoing for a month but had been getting better. Then worsened again yesterday. Told she had muscular strain in ED yesterday. Had been doing a lot of heavy yard work prior to pain beginning one month ago. Denies other trauma to the area. No erythema or bruising over the area. No rash or fever at home. Pain worsens with inspiration and with left shoulder movement. Using her breathing treatments more frequently and is reporting increase in wheezing. Feels like she has a lot of mucus/phelgm but can't cough it up secondary to the chest wall pain. Has not been taking Tramadol and Naproxen because of history of gastritis. No history of VTE.   -  reports that she has been smoking Cigarettes.  She has been smoking about 1.00 pack per day. She does not have any smokeless tobacco history on file. - Review of Systems: Per HPI. - Past Medical History: Patient Active Problem List   Diagnosis Date Noted  . Chest wall pain 06/29/2016  . Chronic pain syndrome 10/06/2014  . 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  . 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 COPD exacerbation (HCC) 02/19/2013  . 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 Cocaine abuse 01/28/2012  . Marijuana abuse 01/28/2012  . Knee pain with effusion 06/13/2011  . Allergic rhinitis 05/08/2011  . Asthma 07/31/2010  . PEPTIC ULCER DISEASE 05/23/2010  . COPD with chronic bronchitis (HCC) 01/14/2008  . POSTMENOPAUSAL WITHOUT HORMONE REPLACEMENT THERAPY 10/13/2007  . GRAVES DISEASE 01/09/2007  . Hypothyroidism 01/09/2007  . Bipolar I disorder (HCC) 01/09/2007  . Anxiety state 01/09/2007  . TOBACCO DEPENDENCE 01/09/2007  . 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   - Medications: reviewed and updated    Objective:   Physical Exam BP (!) 145/90    Pulse 80   Temp 98.4 F (36.9 C)   SpO2 96%  Gen:  alert, non-toxic appearing, crying in pain  CV: RRR, good S1/S2, no murmur, no edema, capillary refill brisk  Resp: diffuse inspiratory and expiratory wheezes throughout lung fields, some diminished air movement at bases bilaterally  Breast: No lumps or masses appreciated in left breast. Normal nipple without discharge.  MSK: Extreme TTP over left chest wall at lateral and anterior 6th-8th ribs. No step off point appreciated.  Skin: No rash appreciated. No overlying ecchymosis or erythema to left chest wall.     Assessment & Plan:   Chest wall pain Patient had negative ischemic cardiac work up at ED visit on 8/17 (troponin 0.00 and EKG without ST segment elevation). CXR also normal at ED visit without sign of PNA or pneumothorax. Suspicion for PE low as Well's Score is 0. Benign breast exam. Suspect pain is msk in nature. Per EMR review, patient does have a history of substance abuse and she asked for narcotics at this visit. [facility] Dr. Donnet[facility]Neal's [facility] not [facility]. -Toradol 60 mg injection for pain given at clinic  -lidocaine gel Rx given for local pain control  -COPD exacerbation treated as below  -patient appears to be due for screening mammogram  -f/u with PCP for further discussion of pain control   COPD exacerbation (HCC) Given complaint of SOB and significant respiratory exam findings, will treat as COPD exacerbation. Patient stable for  home therapy as her O2 sat is normal on RA. -Rx for Doxycyline and Prednisone given  -continue with home Advair and Albuterol     Marcy Sirenatherine Wallace, D.O. 06/29/2016, 5:07 PM PGY-2, [facility]

## 2016-06-29 NOTE — Assessment & Plan Note (Signed)
Given complaint of SOB and significant respiratory exam findings, will treat as COPD exacerbation. Patient stable for home therapy as her O2 sat is normal on RA. -Rx for Doxycyline and Prednisone given  -continue with home Advair and Albuterol

## 2016-09-24 ENCOUNTER — Emergency Department (HOSPITAL_COMMUNITY): Payer: Medicaid Other

## 2016-09-24 ENCOUNTER — Emergency Department (HOSPITAL_COMMUNITY)
Admission: EM | Admit: 2016-09-24 | Discharge: 2016-09-24 | Disposition: A | Discharge status: Home / Self Care | Payer: Medicaid Other | Attending: Emergency Medicine | Admitting: Emergency Medicine

## 2016-09-24 ENCOUNTER — Encounter (HOSPITAL_COMMUNITY): Payer: Self-pay | Admitting: *Deleted

## 2016-09-24 DIAGNOSIS — J441 Chronic obstructive pulmonary disease with (acute) exacerbation: Secondary | ICD-10-CM

## 2016-09-24 DIAGNOSIS — R079 Chest pain, unspecified: Secondary | ICD-10-CM | POA: Diagnosis not present

## 2016-09-24 DIAGNOSIS — K209 Esophagitis, unspecified without bleeding: Secondary | ICD-10-CM

## 2016-09-24 DIAGNOSIS — E039 Hypothyroidism, unspecified: Secondary | ICD-10-CM | POA: Diagnosis not present

## 2016-09-24 DIAGNOSIS — R0602 Shortness of breath: Secondary | ICD-10-CM | POA: Diagnosis present

## 2016-09-24 DIAGNOSIS — F17210 Nicotine dependence, cigarettes, uncomplicated: Secondary | ICD-10-CM | POA: Diagnosis not present

## 2016-09-24 LAB — BASIC METABOLIC PANEL
ANION GAP: 9 (ref 5–15)
BUN: 10 mg/dL (ref 6–20)
CHLORIDE: 105 mmol/L (ref 101–111)
CO2: 24 mmol/L (ref 22–32)
CREATININE: 0.81 mg/dL (ref 0.44–1.00)
Calcium: 9.2 mg/dL (ref 8.9–10.3)
GFR calc non Af Amer: >60 mL/min (ref >60)
Glucose, Bld: 110 mg/dL — ABNORMAL HIGH (ref 65–99)
POTASSIUM: 3.9 mmol/L (ref 3.5–5.1)
SODIUM: 138 mmol/L (ref 135–145)

## 2016-09-24 LAB — CBC
HEMATOCRIT: 44.1 % (ref 36.0–46.0)
HEMOGLOBIN: 14.8 g/dL (ref 12.0–15.0)
MCH: 30.5 pg (ref 26.0–34.0)
MCHC: 33.6 g/dL (ref 30.0–36.0)
MCV: 90.7 fL (ref 78.0–100.0)
PLATELETS: 210 10*3/uL (ref 150–400)
RBC: 4.86 MIL/uL (ref 3.87–5.11)
RDW: 12.2 % (ref 11.5–15.5)
WBC: 4.3 10*3/uL (ref 4.0–10.5)

## 2016-09-24 LAB — HEPATIC FUNCTION PANEL
ALBUMIN: 4.1 g/dL (ref 3.5–5.0)
ALK PHOS: 69 U/L (ref 38–126)
ALT: 62 U/L — AB (ref 14–54)
AST: 64 U/L — ABNORMAL HIGH (ref 15–41)
BILIRUBIN INDIRECT: 0.7 mg/dL (ref 0.3–0.9)
Bilirubin, Direct: 0.2 mg/dL (ref 0.1–0.5)
TOTAL PROTEIN: 7 g/dL (ref 6.5–8.1)
Total Bilirubin: 0.9 mg/dL (ref 0.3–1.2)

## 2016-09-24 LAB — I-STAT TROPONIN, ED: Troponin i, poc: 0 ng/mL (ref 0.00–0.08)

## 2016-09-24 LAB — LIPASE, BLOOD: Lipase: 18 U/L (ref 11–51)

## 2016-09-24 LAB — D-DIMER, QUANTITATIVE (NOT AT ARMC)

## 2016-09-24 MED ORDER — PANTOPRAZOLE SODIUM 40 MG PO TBEC: 40.0000 mg | DELAYED_RELEASE_TABLET | Freq: Two times a day (BID) | ORAL | 0 refills | Status: DC

## 2016-09-24 MED ORDER — GI COCKTAIL ~~LOC~~
30.0000 mL | Freq: Once | ORAL | Status: AC
  Administered 2016-09-24: 30 mL via ORAL
  Filled 2016-09-24: qty 30

## 2016-09-24 MED ORDER — AZITHROMYCIN 250 MG PO TABS: 250.0000 mg | ORAL_TABLET | Freq: Every day | ORAL | 0 refills | Status: DC

## 2016-09-24 MED ORDER — KETOROLAC TROMETHAMINE 30 MG/ML IJ SOLN
30.0000 mg | Freq: Once | INTRAMUSCULAR | Status: AC
  Administered 2016-09-24: 30 mg via INTRAVENOUS
  Filled 2016-09-24: qty 1

## 2016-09-24 MED ORDER — SODIUM CHLORIDE 0.9 % IV BOLUS (SEPSIS)
1000.0000 mL | Freq: Once | INTRAVENOUS | Status: AC
  Administered 2016-09-24: 1000 mL via INTRAVENOUS

## 2016-09-24 MED ORDER — FAMOTIDINE 20 MG PO TABS: 20.0000 mg | ORAL_TABLET | Freq: Two times a day (BID) | ORAL | 0 refills | Status: DC

## 2016-09-24 MED ORDER — ONDANSETRON HCL 4 MG/2ML IJ SOLN
4.0000 mg | Freq: Once | INTRAMUSCULAR | Status: AC
  Administered 2016-09-24: 4 mg via INTRAVENOUS
  Filled 2016-09-24: qty 2

## 2016-09-24 MED ORDER — ACETAMINOPHEN 325 MG PO TABS: 650.0000 mg | ORAL_TABLET | Freq: Once | ORAL | Status: DC

## 2016-09-24 MED ORDER — IPRATROPIUM-ALBUTEROL 0.5-2.5 (3) MG/3ML IN SOLN
3.0000 mL | Freq: Once | RESPIRATORY_TRACT | Status: AC
  Administered 2016-09-24: 3 mL via RESPIRATORY_TRACT
  Filled 2016-09-24: qty 3

## 2016-09-24 MED ORDER — ONDANSETRON 4 MG PO TBDP: 4.0000 mg | ORAL_TABLET | Freq: Three times a day (TID) | ORAL | 0 refills | Status: DC | PRN

## 2016-09-24 MED ORDER — FAMOTIDINE IN NACL 20-0.9 MG/50ML-% IV SOLN
20.0000 mg | Freq: Once | INTRAVENOUS | Status: AC
  Administered 2016-09-24: 20 mg via INTRAVENOUS
  Filled 2016-09-24: qty 50

## 2016-09-24 NOTE — ED Notes (Signed)
Provided coke with ice to patient with permission from Dr. Criss AlvineGoldston.

## 2016-09-24 NOTE — ED Notes (Signed)
Notified Dr. Criss AlvineGoldston of patient refusing the Tylenol and requesting po fluids.  Permission given for po fluids.

## 2016-09-24 NOTE — ED Notes (Signed)
Patient transported to X-ray 

## 2016-09-24 NOTE — ED Triage Notes (Addendum)
Pt complains of shortness of breath, nausea, back pain, chest pain for the past 3 weeks. Pt has hx of COPD bladder cancer. Pt last had chemo 3 weeks ago.

## 2016-09-24 NOTE — ED Provider Notes (Signed)
[facility]-EMERGENCY DEPT Provider Note   CSN: [id number] Arrival date & time: 09/24/16  1252     History   Chief Complaint Chief Complaint  Patient presents with  . Shortness of Breath  . Nausea  . ca patient    HPI [name] is a 45 y.o. female.  HPI  45 year old female presents with multiple complaints. Primary complaint appears to be vomiting and chest pain for 3 weeks. She states she vomits anytime she eats or drinks something. Vomited back up immediately. No hematemesis. She's having chest pain that is similar to when she was told she had esophageal ulcers. She is not currently on a PPI. The chest pain is constant. 3 days ago she developed cough with black sputum, shortness of breath, and left-sided low back pain. Has a history of interstitial cystitis but states this feels different. Radiates from her low back into her left midthoracic back and she feels like her lung is hurting. She has taken Tylenol and over-the-counter Robitussin without relief. She also feels diffuse body aches and subjective hot and cold chills. No leg swelling.  Past Medical History:  Diagnosis Date  . Anxiety   . Bipolar 1 disorder (HCC)   . COPD (chronic obstructive pulmonary disease) (HCC)   . Depression   . Interstitial cystitis   . Renal disorder    intercysticial cystitis.  . Thyroid disease    graves   . Ulcer [facility])     Patient Active Problem List   Diagnosis Date Noted  . Chest wall pain 06/29/2016  . Chronic pain syndrome 10/06/2014  . 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  . 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 COPD exacerbation (HCC) 02/19/2013  . 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 Cocaine abuse 01/28/2012  . Marijuana abuse 01/28/2012  . Knee pain with effusion 06/13/2011  . Allergic rhinitis 05/08/2011  . Asthma 07/31/2010  . PEPTIC ULCER DISEASE 05/23/2010  . COPD with chronic bronchitis (HCC) 01/14/2008  . POSTMENOPAUSAL WITHOUT HORMONE REPLACEMENT  THERAPY 10/13/2007  . GRAVES DISEASE 01/09/2007  . Hypothyroidism 01/09/2007  . Bipolar I disorder (HCC) 01/09/2007  . Anxiety state 01/09/2007  . TOBACCO DEPENDENCE 01/09/2007  . 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    Past Surgical History:  Procedure Laterality Date  . ABDOMINAL HYSTERECTOMY    . KNEE ARTHROCENTESIS     right    OB History    No data available       Home Medications    Prior to Admission medications   Medication Sig Start Date End Date Taking? Authorizing Provider  acetaminophen (TYLENOL) 500 MG tablet Take 1,000 mg by mouth every 6 (six) hours as needed (fever, pain).   Yes Historical Provider, MD  albuterol (PROAIR HFA) 108 (90 Base) MCG/ACT inhaler INHALE 1-2 PUFFS BY MOUTH EVERY 4-6 HOURS AS NEEDED for shortness of breath 05/23/16  Yes Nestor Ramp, MD  albuterol (PROVENTIL) (2.5 MG/3ML) 0.083% nebulizer solution Take 3 mLs (2.5 mg total) by nebulization every 6 (six) hours as needed for wheezing or shortness of breath. 05/14/16  Yes Dione Booze, MD  clonazePAM (KLONOPIN) 1 MG tablet Take 1 mg by mouth daily.   Yes Historical Provider, MD  Fluticasone-Salmeterol (ADVAIR) 250-50 MCG/DOSE AEPB Inhale 1 puff into the lungs 2 (two) times daily. 05/23/16  Yes Nestor Ramp, MD  levothyroxine (SYNTHROID, LEVOTHROID) 137 MCG tablet Take 1 tablet (137 mcg total) by mouth daily. 06/13/16  Yes Nestor Ramp, MD  azithromycin (ZITHROMAX) 250 MG tablet Take  1 tablet (250 mg total) by mouth daily. Take first 2 tablets together, then 1 every day until finished. 09/24/16   Pricilla LovelessScott Lydie Stammen, MD  doxycycline (VIBRA-TABS) 100 MG tablet Take 1 tablet (100 mg total) by mouth 2 (two) times daily. Patient not taking: Reported on 09/24/2016 06/29/16   Arvilla Marketatherine Lauren Wallace, DO  famotidine (PEPCID) 20 MG tablet Take 1 tablet (20 mg total) by mouth 2 (two) times daily. 09/24/16   Pricilla LovelessScott Anant Agard, MD  Lidocaine 2 % GEL Apply 1 application topically 4 (four) times daily as needed. 06/29/16    Arvilla Marketatherine Lauren Wallace, DO  naproxen (NAPROSYN) 500 MG tablet Take 1 tablet (500 mg total) by mouth 2 (two) times daily. Patient not taking: Reported on 09/24/2016 06/28/16   Arby BarretteMarcy Pfeiffer, MD  ondansetron (ZOFRAN ODT) 4 MG disintegrating tablet Take 1 tablet (4 mg total) by mouth every 8 (eight) hours as needed for nausea or vomiting. 09/24/16   Pricilla LovelessScott Blayke Pinera, MD  pantoprazole (PROTONIX) 40 MG tablet Take 1 tablet (40 mg total) by mouth 2 (two) times daily. 09/24/16   Pricilla LovelessScott Ahmet Schank, MD  predniSONE (DELTASONE) 50 MG tablet Take one tablet daily with breakfast for five days Patient not taking: Reported on 09/24/2016 06/29/16   Arvilla Marketatherine Lauren Wallace, DO  traMADol (ULTRAM) 50 MG tablet Take 2 tablets (100 mg total) by mouth every 6 (six) hours as needed. Patient not taking: Reported on 09/24/2016 06/28/16   Arby BarretteMarcy Pfeiffer, MD    Family History No family history on file.  Social History Social History  Substance Use Topics  . Smoking status: Current Every Day Smoker    Packs/day: 1.00    Types: Cigarettes  . Smokeless tobacco: Never Used  . Alcohol use No     Allergies   Hydrocodone   Review of Systems Review of Systems  Constitutional: Positive for chills and fever.  HENT: Positive for congestion.   Respiratory: Positive for cough and shortness of breath.   Cardiovascular: Positive for chest pain.  Gastrointestinal: Positive for nausea and vomiting. Negative for abdominal pain.  Genitourinary: Negative for dysuria.  Musculoskeletal: Positive for back pain.  All other systems reviewed and are negative.    Physical Exam Updated Vital Signs BP 103/74 (BP Location: Right Arm)   Pulse 82   Temp 98.6 F (37 C) (Core (Comment))   Resp 20   Wt 123 lb (55.8 kg)   SpO2 98%   BMI 22.50 kg/m   Physical Exam  Constitutional: She is oriented to person, place, and time. She appears well-developed and well-nourished. No distress.  Asleep when I first walked into the room, I  had to wake her up  HENT:  Head: Normocephalic and atraumatic.  Right Ear: External ear normal.  Left Ear: External ear normal.  Nose: Nose normal.  Eyes: Right eye exhibits no discharge. Left eye exhibits no discharge.  Cardiovascular: Normal rate, regular rhythm and normal heart sounds.   Pulmonary/Chest: Effort normal. She has wheezes (expiratory, diffuse). She exhibits no tenderness.  Abdominal: Soft. She exhibits no distension. There is no tenderness.  Musculoskeletal: She exhibits no edema.  Neurological: She is alert and oriented to person, place, and time.  Skin: Skin is warm and dry. She is not diaphoretic.  Nursing note and vitals reviewed.    ED Treatments / Results  Labs (all labs ordered are listed, but only abnormal results are displayed) Labs Reviewed  BASIC METABOLIC PANEL - Abnormal; Notable for the following:       Result  Value   Glucose, Bld 110 (*)    All other components within normal limits  HEPATIC FUNCTION PANEL - Abnormal; Notable for the following:    AST 64 (*)    ALT 62 (*)    All other components within normal limits  CBC  D-DIMER, QUANTITATIVE (NOT AT [facility])  LIPASE, BLOOD  I-STAT TROPOININ, ED    EKG  EKG Interpretation  Date/Time:  Monday September 24 2016 13:14:38 EST Ventricular Rate:  96 PR Interval:    QRS Duration: 82 QT Interval:  324 QTC Calculation: 410 R Axis:   76 Text Interpretation:  Normal sinus rhythm Biatrial enlargement Low voltage, precordial leads no significant change since Aug 2017 Confirmed by Criss AlvineGOLDSTON MD, Andrae Claunch [phone]([id number]) on 09/24/2016 2:14:20 PM       Radiology Dg Chest 2 View  Result Date: 09/24/2016 CLINICAL DATA:  Three weeks of shortness of breath, chest and back pain, and nausea. History of COPD, current smoker. Currently being treated for bladder malignancy. EXAM: CHEST  2 VIEW COMPARISON:  PA and lateral chest x-ray of June 28, 2016 FINDINGS: The lungs are hyperinflated. There is no focal infiltrate.  There is no pleural effusion. The heart and pulmonary vascularity are normal. The mediastinum is normal in width. The bony thorax is unremarkable. IMPRESSION: COPD. No pneumonia, CHF, nor other acute cardiopulmonary abnormalities. Electronically Signed   By: David  SwazilandJordan M.D.   On: 09/24/2016 13:36    Procedures Procedures (including critical care time)  Medications Ordered in ED Medications  acetaminophen (TYLENOL) tablet 650 mg (650 mg Oral Refused 09/24/16 1535)  ketorolac (TORADOL) 30 MG/ML injection 30 mg (not administered)  sodium chloride 0.9 % bolus 1,000 mL (1,000 mLs Intravenous New Bag/Given 09/24/16 1437)  ondansetron (ZOFRAN) injection 4 mg (4 mg Intravenous Given 09/24/16 1439)  gi cocktail (Maalox,Lidocaine,Donnatal) (30 mLs Oral Given 09/24/16 1443)  famotidine (PEPCID) IVPB 20 mg premix (0 mg Intravenous Stopped 09/24/16 1514)  ipratropium-albuterol (DUONEB) 0.5-2.5 (3) MG/3ML nebulizer solution 3 mL (3 mLs Nebulization Given 09/24/16 1454)     Initial Impression / Assessment and Plan / ED Course  I have reviewed the triage vital signs and the nursing notes.  Pertinent labs & imaging results that were available during my care of the patient were reviewed by me and considered in my medical decision making (see chart for details).  Clinical Course as of Sep 24 1633  Mon Sep 24, 2016  1426 I think patient has two separate issues. One is the 3 week chest pain/vomiting that's probably gastric/esophageal. Also has 3 days of cough, body aches, subjective fevers that's probably COPD/URI. Given some intermittent tachycardia while in room will get ddimer, my suspicion of ACS, PE, dissection is otherwise low  [SG]    Clinical Course User Index [SG] Pricilla LovelessScott Aigner Horseman, MD    I believe the patient's chest pain is GI related. I have discussed the importance of getting back on a PPI and H2 blocker. This is probably causing her pain and vomiting for the last 3 weeks. Now probably has a  recent respiratory infection causing a COPD exacerbation. Her work of breathing is normal, no hypoxia. All the mild wheezing. I do not think steroids would be particular helpful and might cause harm given her history of ulcers. She will be given azithromycin given the change in sputum and shortness of breath. She is requesting a dose of Toradol prior to discharge. She was also requesting IV morphine, her chart shows past history of narcotic abuse and  possible diversion. I do not think narcotics in the ED or on discharge are warranted. Discussed importance of PCP follow-up as well as return precautions. My suspicion of ACS, PE, dissection are quite low.  Final Clinical Impressions(s) / ED Diagnoses   Final diagnoses:  Chest pain in adult  Esophagitis  COPD exacerbation (HCC)    New Prescriptions New Prescriptions   AZITHROMYCIN (ZITHROMAX) 250 MG TABLET    Take 1 tablet (250 mg total) by mouth daily. Take first 2 tablets together, then 1 every day until finished.   FAMOTIDINE (PEPCID) 20 MG TABLET    Take 1 tablet (20 mg total) by mouth 2 (two) times daily.   ONDANSETRON (ZOFRAN ODT) 4 MG DISINTEGRATING TABLET    Take 1 tablet (4 mg total) by mouth every 8 (eight) hours as needed for nausea or vomiting.   PANTOPRAZOLE (PROTONIX) 40 MG TABLET    Take 1 tablet (40 mg total) by mouth 2 (two) times daily.     Pricilla Loveless, MD 09/24/16 [phone]

## 2016-10-23 ENCOUNTER — Emergency Department (HOSPITAL_COMMUNITY): Payer: Medicaid Other

## 2016-10-23 ENCOUNTER — Emergency Department (HOSPITAL_COMMUNITY)
Admission: EM | Admit: 2016-10-23 | Discharge: 2016-10-23 | Disposition: A | Discharge status: Home / Self Care | Payer: Medicaid Other | Attending: Emergency Medicine | Admitting: Emergency Medicine

## 2016-10-23 ENCOUNTER — Encounter (HOSPITAL_COMMUNITY): Payer: Self-pay

## 2016-10-23 DIAGNOSIS — F419 Anxiety disorder, unspecified: Secondary | ICD-10-CM | POA: Diagnosis not present

## 2016-10-23 DIAGNOSIS — R1310 Dysphagia, unspecified: Secondary | ICD-10-CM | POA: Diagnosis not present

## 2016-10-23 DIAGNOSIS — R112 Nausea with vomiting, unspecified: Secondary | ICD-10-CM | POA: Diagnosis not present

## 2016-10-23 DIAGNOSIS — F17210 Nicotine dependence, cigarettes, uncomplicated: Secondary | ICD-10-CM | POA: Insufficient documentation

## 2016-10-23 DIAGNOSIS — E039 Hypothyroidism, unspecified: Secondary | ICD-10-CM | POA: Insufficient documentation

## 2016-10-23 DIAGNOSIS — R070 Pain in throat: Secondary | ICD-10-CM | POA: Diagnosis present

## 2016-10-23 DIAGNOSIS — Z79899 Other long term (current) drug therapy: Secondary | ICD-10-CM | POA: Insufficient documentation

## 2016-10-23 DIAGNOSIS — J449 Chronic obstructive pulmonary disease, unspecified: Secondary | ICD-10-CM | POA: Diagnosis not present

## 2016-10-23 LAB — CBC WITH DIFFERENTIAL/PLATELET
BASOS ABS: 0 10*3/uL (ref 0.0–0.1)
Basophils Relative: 1 %
Eosinophils Absolute: 0.1 10*3/uL (ref 0.0–0.7)
Eosinophils Relative: 2 %
HEMATOCRIT: 45.1 % (ref 36.0–46.0)
HEMOGLOBIN: 15.5 g/dL — AB (ref 12.0–15.0)
LYMPHS PCT: 39 %
Lymphs Abs: 2 10*3/uL (ref 0.7–4.0)
MCH: 31.2 pg (ref 26.0–34.0)
MCHC: 34.4 g/dL (ref 30.0–36.0)
MCV: 90.7 fL (ref 78.0–100.0)
MONO ABS: 0.5 10*3/uL (ref 0.1–1.0)
MONOS PCT: 10 %
NEUTROS ABS: 2.5 10*3/uL (ref 1.7–7.7)
NEUTROS PCT: 48 %
Platelets: 243 10*3/uL (ref 150–400)
RBC: 4.97 MIL/uL (ref 3.87–5.11)
RDW: 12.7 % (ref 11.5–15.5)
WBC: 5.2 10*3/uL (ref 4.0–10.5)

## 2016-10-23 LAB — COMPREHENSIVE METABOLIC PANEL
ALBUMIN: 4.3 g/dL (ref 3.5–5.0)
ALT: 65 U/L — ABNORMAL HIGH (ref 14–54)
ANION GAP: 7 (ref 5–15)
AST: 61 U/L — ABNORMAL HIGH (ref 15–41)
Alkaline Phosphatase: 89 U/L (ref 38–126)
BUN: 18 mg/dL (ref 6–20)
CO2: 27 mmol/L (ref 22–32)
Calcium: 9.4 mg/dL (ref 8.9–10.3)
Chloride: 103 mmol/L (ref 101–111)
Creatinine, Ser: 0.7 mg/dL (ref 0.44–1.00)
GFR calc non Af Amer: >60 mL/min (ref >60)
GLUCOSE: 93 mg/dL (ref 65–99)
POTASSIUM: 4 mmol/L (ref 3.5–5.1)
SODIUM: 137 mmol/L (ref 135–145)
Total Bilirubin: 0.8 mg/dL (ref 0.3–1.2)
Total Protein: 7.5 g/dL (ref 6.5–8.1)

## 2016-10-23 MED ORDER — ONDANSETRON HCL 4 MG/2ML IJ SOLN
4.0000 mg | Freq: Once | INTRAMUSCULAR | Status: AC
  Administered 2016-10-23: 4 mg via INTRAVENOUS
  Filled 2016-10-23: qty 2

## 2016-10-23 MED ORDER — TRAMADOL HCL 50 MG PO TABS: 50.0000 mg | ORAL_TABLET | Freq: Four times a day (QID) | ORAL | 0 refills | Status: DC | PRN

## 2016-10-23 MED ORDER — ONDANSETRON HCL 4 MG PO TABS: 4.0000 mg | ORAL_TABLET | Freq: Four times a day (QID) | ORAL | 0 refills | Status: DC | PRN

## 2016-10-23 MED ORDER — IPRATROPIUM-ALBUTEROL 0.5-2.5 (3) MG/3ML IN SOLN
3.0000 mL | Freq: Once | RESPIRATORY_TRACT | Status: AC
  Administered 2016-10-23: 3 mL via RESPIRATORY_TRACT
  Filled 2016-10-23: qty 3

## 2016-10-23 MED ORDER — LORAZEPAM 1 MG PO TABS: 1.0000 mg | ORAL_TABLET | Freq: Three times a day (TID) | ORAL | 0 refills | Status: DC | PRN

## 2016-10-23 MED ORDER — SUCRALFATE 1 GM/10ML PO SUSP: 1.0000 g | Freq: Three times a day (TID) | ORAL | 0 refills | Status: DC

## 2016-10-23 MED ORDER — LORAZEPAM 0.5 MG PO TABS
0.5000 mg | ORAL_TABLET | Freq: Once | ORAL | Status: AC
  Administered 2016-10-23: 0.5 mg via ORAL
  Filled 2016-10-23: qty 1

## 2016-10-23 MED ORDER — PANTOPRAZOLE SODIUM 20 MG PO TBEC: 20.0000 mg | DELAYED_RELEASE_TABLET | Freq: Every day | ORAL | 0 refills | Status: DC

## 2016-10-23 MED ORDER — SODIUM CHLORIDE 0.9 % IV BOLUS (SEPSIS)
1000.0000 mL | Freq: Once | INTRAVENOUS | Status: AC
  Administered 2016-10-23: 1000 mL via INTRAVENOUS

## 2016-10-23 MED ORDER — MORPHINE SULFATE (PF) 4 MG/ML IV SOLN
4.0000 mg | Freq: Once | INTRAVENOUS | Status: AC
  Administered 2016-10-23: 4 mg via INTRAVENOUS
  Filled 2016-10-23: qty 1

## 2016-10-23 NOTE — Discharge Instructions (Signed)
Your pain is probably from ulcers in your esophagus. This can happen with the antibiotic you're taking last month (doxycycline). Please make an appointment with gastroenterology to evaluate your esophagus. In the meantime, take the medications prescribed. Return if symptoms are worsening.

## 2016-10-23 NOTE — ED Triage Notes (Signed)
Pt has a hx of esophageal ulcers and for the last three days she hasn't been able to swallow and she's scared to go to sleep She states that it feels like a knife going through her espohagus

## 2016-10-23 NOTE — ED Provider Notes (Signed)
[name]   CSN: [id number] Arrival date & time: 10/23/16  4098     History   Chief Complaint Chief Complaint  Patient presents with  . Oral Swelling    HPI [name] is a 45 y.o. female.  She complains of pain in her esophagus for the last 3 days. Pain is sharp and she rates it at 8/10. It is worse if she tries to swallow anything. During this time, she states that she has not been able to swallow anything. However, on further questioning, water and Sprite or able to go down but she cannot swallow her saliva and cannot swallow solid food. She states she isn't vomiting for 3 weeks. She's not had anything to eat or drink the last 2 days. She is very anxious about this and is worried that if she lays down, she will stop breathing. She has felt so bad that she has not been able to smoke in the last 24 hours. She usually smokes about 1 pack of cigarettes a day. She relates a history of esophageal ulcers and states this is how they felt. She had been seen approximately one month ago and was prescribed pantoprazole and famotidine, but she is running out of those medications.   The history is provided by the patient.    Past Medical History:  Diagnosis Date  . Anxiety   . Bipolar 1 disorder (HCC)   . COPD (chronic obstructive pulmonary disease) (HCC)   . Depression   . Interstitial cystitis   . Renal disorder    intercysticial cystitis.  . Thyroid disease    graves   . Ulcer [facility])     Patient Active Problem List   Diagnosis Date Noted  . Chest wall pain 06/29/2016  . Chronic pain syndrome 10/06/2014  . 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  . 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 COPD exacerbation (HCC) 02/19/2013  . 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 Cocaine abuse 01/28/2012  . Marijuana abuse 01/28/2012  . Knee pain with effusion 06/13/2011  . Allergic rhinitis 05/08/2011  . Asthma 07/31/2010  . PEPTIC ULCER DISEASE 05/23/2010   . COPD with chronic bronchitis (HCC) 01/14/2008  . POSTMENOPAUSAL WITHOUT HORMONE REPLACEMENT THERAPY 10/13/2007  . GRAVES DISEASE 01/09/2007  . Hypothyroidism 01/09/2007  . Bipolar I disorder (HCC) 01/09/2007  . Anxiety state 01/09/2007  . TOBACCO DEPENDENCE 01/09/2007  . 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    Past Surgical History:  Procedure Laterality Date  . ABDOMINAL HYSTERECTOMY    . KNEE ARTHROCENTESIS     right    OB History    No data available       Home Medications    Prior to Admission medications   Medication Sig Start Date End Date Taking? Authorizing Provider  acetaminophen (TYLENOL) 500 MG tablet Take 1,000 mg by mouth every 6 (six) hours as needed (fever, pain).    Historical Provider, MD  albuterol (PROAIR HFA) 108 (90 Base) MCG/ACT inhaler INHALE 1-2 PUFFS BY MOUTH EVERY 4-6 HOURS AS NEEDED for shortness of breath 05/23/16   Nestor Ramp, MD  albuterol (PROVENTIL) (2.5 MG/3ML) 0.083% nebulizer solution Take 3 mLs (2.5 mg total) by nebulization every 6 (six) hours as needed for wheezing or shortness of breath. 05/14/16   Dione Booze, MD  azithromycin (ZITHROMAX) 250 MG tablet Take 1 tablet (250 mg total) by mouth daily. Take first 2 tablets together, then 1 every day until finished. 09/24/16   Pricilla Loveless, MD  clonazePAM (KLONOPIN) 1 MG tablet Take 1 mg by mouth daily.    Historical Provider, MD  doxycycline (VIBRA-TABS) 100 MG tablet Take 1 tablet (100 mg total) by mouth 2 (two) times daily. Patient not taking: Reported on 09/24/2016 06/29/16   Arvilla Marketatherine Lauren Wallace, DO  famotidine (PEPCID) 20 MG tablet Take 1 tablet (20 mg total) by mouth 2 (two) times daily. 09/24/16   Pricilla LovelessScott Goldston, MD  Fluticasone-Salmeterol (ADVAIR) 250-50 MCG/DOSE AEPB Inhale 1 puff into the lungs 2 (two) times daily. 05/23/16   Nestor RampSara L Neal, MD  levothyroxine (SYNTHROID, LEVOTHROID) 137 MCG tablet Take 1 tablet (137 mcg total) by mouth daily. 06/13/16   Nestor RampSara L Neal, MD  Lidocaine 2 % GEL  Apply 1 application topically 4 (four) times daily as needed. 06/29/16   Arvilla Marketatherine Lauren Wallace, DO  naproxen (NAPROSYN) 500 MG tablet Take 1 tablet (500 mg total) by mouth 2 (two) times daily. Patient not taking: Reported on 09/24/2016 06/28/16   Arby BarretteMarcy Pfeiffer, MD  ondansetron (ZOFRAN ODT) 4 MG disintegrating tablet Take 1 tablet (4 mg total) by mouth every 8 (eight) hours as needed for nausea or vomiting. 09/24/16   Pricilla LovelessScott Goldston, MD  pantoprazole (PROTONIX) 40 MG tablet Take 1 tablet (40 mg total) by mouth 2 (two) times daily. 09/24/16   Pricilla LovelessScott Goldston, MD  predniSONE (DELTASONE) 50 MG tablet Take one tablet daily with breakfast for five days Patient not taking: Reported on 09/24/2016 06/29/16   Arvilla Marketatherine Lauren Wallace, DO  traMADol (ULTRAM) 50 MG tablet Take 2 tablets (100 mg total) by mouth every 6 (six) hours as needed. Patient not taking: Reported on 09/24/2016 06/28/16   Arby BarretteMarcy Pfeiffer, MD    Family History History reviewed. No pertinent family history.  Social History Social History  Substance Use Topics  . Smoking status: Current Every Day Smoker    Packs/day: 1.00    Types: Cigarettes  . Smokeless tobacco: Never Used  . Alcohol use No     Allergies   Hydrocodone   Review of Systems Review of Systems  All other systems reviewed and are negative.    Physical Exam Updated Vital Signs BP 127/92 (BP Location: Right Arm)   Pulse 95   Temp 97.7 F (36.5 C) (Oral)   Resp 18   Ht 5' (1.524 m)   Wt 125 lb (56.7 kg)   SpO2 96%   BMI 24.41 kg/m   Physical Exam  Nursing Brady and vitals reviewed.  45 year old female, Appears quite anxious, but is in no acute distress. Vital signs are normal. Oxygen saturation is 96%, which is normal. Head is normocephalic and atraumatic. PERRLA, EOMI. Oropharynx is clear. There is no pooling of secretions. Phonation is normal. There is no drooling. Neck is nontender and supple without adenopathy or JVD. Back is nontender and there  is no CVA tenderness. Lungs have faint expiratory wheezes. There are no rales or rhonchi. Chest is nontender. Heart has regular rate and rhythm without murmur. Abdomen is soft, flat, nontender without masses or hepatosplenomegaly and peristalsis is hypoactive. Extremities have no cyanosis or edema, full range of motion is present. Skin is warm and dry without rash. Neurologic: Mental status is normal, cranial nerves are intact, there are no motor or sensory deficits.  ED Treatments / Results  Labs (all labs ordered are listed, but only abnormal results are displayed) Labs Reviewed  COMPREHENSIVE METABOLIC PANEL - Abnormal; Notable for the following:       Result Value   AST  61 (*)    ALT 65 (*)    All other components within normal limits  CBC WITH DIFFERENTIAL/PLATELET - Abnormal; Notable for the following:    Hemoglobin 15.5 (*)    All other components within normal limits    EKG  EKG Interpretation  Date/Time:  Tuesday October 23 2016 03:24:19 EST Ventricular Rate:  87 PR Interval:    QRS Duration: 81 QT Interval:  341 QTC Calculation: 411 R Axis:   79 Text Interpretation:  Sinus rhythm Normal ECG When compared with ECG of 09/24/2016, No significant change was found Confirmed by [facility]  MD, Florentine Diekman ([id number]) on 10/23/2016 3:32:56 AM       Radiology No results found.  Procedures Procedures (including critical care time)  Medications Ordered in ED Medications  sodium chloride 0.9 % bolus 1,000 mL (0 mLs Intravenous Stopped 10/23/16 0547)  ondansetron (ZOFRAN) injection 4 mg (4 mg Intravenous Given 10/23/16 0457)  morphine 4 MG/ML injection 4 mg (4 mg Intravenous Given 10/23/16 0458)  ipratropium-albuterol (DUONEB) 0.5-2.5 (3) MG/3ML nebulizer solution 3 mL (3 mLs Nebulization Given 10/23/16 0555)  LORazepam (ATIVAN) tablet 0.5 mg (0.5 mg Oral Given 10/23/16 0458)     Initial Impression / Assessment and Plan / ED Course  I have reviewed the triage vital signs and  the nursing notes.  Pertinent labs & imaging results that were available during my care of the patient were reviewed by me and considered in my medical decision making (see chart for details).  Clinical Course    Dysphagia and odynophagia in patient with history of esophageal ulcers. Recurrent ulcers is certainly a possibility. She will need endoscopy to fully evaluate this. Initially, will get screening labs and give IV fluids. There is slight wheezing, so she is given albuterol with ipratropium via nebulizer. She's given morphine for pain and lorazepam for anxiety.   She had excellent relief of symptoms with above noted treatment. Screening labs are unremarkable. Of Brady, review of old records shows that she had been prescribed doxycycline last month. Doxycycline is well-known to cause esophageal ulceration and I suspect this is the cause of her esophageal ulcers and symptoms.. She is given reassurance and discharged with prescriptions for pantoprazole, sucralfate liquid, tramadol, ondansetron, lorazepam. She is referred to gastroenterology for follow-up.  Final Clinical Impressions(s) / ED Diagnoses   Final diagnoses:  Odynophagia  Non-intractable vomiting with nausea, unspecified vomiting type  Anxiety    New Prescriptions New Prescriptions   LORAZEPAM (ATIVAN) 1 MG TABLET    Take 1 tablet (1 mg total) by mouth 3 (three) times daily as needed for anxiety.   ONDANSETRON (ZOFRAN) 4 MG TABLET    Take 1 tablet (4 mg total) by mouth every 6 (six) hours as needed for nausea or vomiting.   PANTOPRAZOLE (PROTONIX) 20 MG TABLET    Take 1 tablet (20 mg total) by mouth daily.   SUCRALFATE (CARAFATE) 1 GM/10ML SUSPENSION    Take 10 mLs (1 g total) by mouth 4 (four) times daily -  with meals and at bedtime.   TRAMADOL (ULTRAM) 50 MG TABLET    Take 1 tablet (50 mg total) by mouth every 6 (six) hours as needed.     Dione Boozeavid Chrislyn Seedorf, MD 10/23/16 [id number]

## 2016-10-23 NOTE — ED Notes (Signed)
Bed: WA04 Expected date:  Expected time:  Means of arrival:  Comments: 

## 2016-10-23 NOTE — ED Notes (Signed)
Bed: WA03 Expected date:  Expected time:  Means of arrival:  Comments: 

## 2016-10-24 ENCOUNTER — Telehealth: Payer: Self-pay | Admitting: Family Medicine

## 2016-10-24 DIAGNOSIS — R1310 Dysphagia, unspecified: Secondary | ICD-10-CM

## 2016-10-24 NOTE — Telephone Encounter (Signed)
[facility] to see Dr. Ewing SchleinMagod. Patient states she just got out of the hospital and has esophageal ulcer. Please advise.

## 2016-10-25 NOTE — Telephone Encounter (Signed)
Tia I would be happy to refer her but I do not think she has any insurance. Do any of the GI doctors take uninsured patients? Let me know what you think THANKS! Denny LevySara Hardy Harcum

## 2016-10-25 NOTE — Telephone Encounter (Signed)
Patient has Medicaid, this is why she needs the referral. [name] will not schedule her without referral from our office.

## 2016-11-23 ENCOUNTER — Inpatient Hospital Stay (HOSPITAL_COMMUNITY)
Admission: EM | Admit: 2016-11-23 | Discharge: 2016-12-06 | DRG: 956 | Disposition: A | Discharge status: Skilled Nursing Facility | Payer: Medicaid Other | Attending: General Surgery | Admitting: General Surgery

## 2016-11-23 ENCOUNTER — Other Ambulatory Visit: Payer: Self-pay

## 2016-11-23 DIAGNOSIS — S3282XA Multiple fractures of pelvis without disruption of pelvic ring, initial encounter for closed fracture: Secondary | ICD-10-CM | POA: Diagnosis present

## 2016-11-23 DIAGNOSIS — Z79899 Other long term (current) drug therapy: Secondary | ICD-10-CM

## 2016-11-23 DIAGNOSIS — S32119A Unspecified Zone I fracture of sacrum, initial encounter for closed fracture: Secondary | ICD-10-CM | POA: Diagnosis present

## 2016-11-23 DIAGNOSIS — E0500 Thyrotoxicosis with diffuse goiter without thyrotoxic crisis or storm: Secondary | ICD-10-CM | POA: Diagnosis present

## 2016-11-23 DIAGNOSIS — S62613A Displaced fracture of proximal phalanx of left middle finger, initial encounter for closed fracture: Secondary | ICD-10-CM | POA: Diagnosis present

## 2016-11-23 DIAGNOSIS — F17210 Nicotine dependence, cigarettes, uncomplicated: Secondary | ICD-10-CM | POA: Diagnosis present

## 2016-11-23 DIAGNOSIS — T1490XA Injury, unspecified, initial encounter: Secondary | ICD-10-CM

## 2016-11-23 DIAGNOSIS — D62 Acute posthemorrhagic anemia: Secondary | ICD-10-CM | POA: Diagnosis present

## 2016-11-23 DIAGNOSIS — E559 Vitamin D deficiency, unspecified: Secondary | ICD-10-CM | POA: Diagnosis present

## 2016-11-23 DIAGNOSIS — S82201A Unspecified fracture of shaft of right tibia, initial encounter for closed fracture: Secondary | ICD-10-CM | POA: Diagnosis present

## 2016-11-23 DIAGNOSIS — F1210 Cannabis abuse, uncomplicated: Secondary | ICD-10-CM | POA: Diagnosis present

## 2016-11-23 DIAGNOSIS — S32810A Multiple fractures of pelvis with stable disruption of pelvic ring, initial encounter for closed fracture: Secondary | ICD-10-CM | POA: Diagnosis present

## 2016-11-23 DIAGNOSIS — S329XXA Fracture of unspecified parts of lumbosacral spine and pelvis, initial encounter for closed fracture: Secondary | ICD-10-CM

## 2016-11-23 DIAGNOSIS — S32512A Fracture of superior rim of left pubis, initial encounter for closed fracture: Secondary | ICD-10-CM

## 2016-11-23 DIAGNOSIS — S82102A Unspecified fracture of upper end of left tibia, initial encounter for closed fracture: Secondary | ICD-10-CM

## 2016-11-23 DIAGNOSIS — Y92410 Unspecified street and highway as the place of occurrence of the external cause: Secondary | ICD-10-CM

## 2016-11-23 DIAGNOSIS — S62664A Nondisplaced fracture of distal phalanx of right ring finger, initial encounter for closed fracture: Secondary | ICD-10-CM | POA: Diagnosis present

## 2016-11-23 DIAGNOSIS — S62609A Fracture of unspecified phalanx of unspecified finger, initial encounter for closed fracture: Secondary | ICD-10-CM | POA: Diagnosis present

## 2016-11-23 DIAGNOSIS — S82831A Other fracture of upper and lower end of right fibula, initial encounter for closed fracture: Secondary | ICD-10-CM | POA: Diagnosis present

## 2016-11-23 DIAGNOSIS — E890 Postprocedural hypothyroidism: Secondary | ICD-10-CM | POA: Diagnosis present

## 2016-11-23 DIAGNOSIS — N3010 Interstitial cystitis (chronic) without hematuria: Secondary | ICD-10-CM | POA: Diagnosis present

## 2016-11-23 DIAGNOSIS — S32592A Other specified fracture of left pubis, initial encounter for closed fracture: Secondary | ICD-10-CM

## 2016-11-23 DIAGNOSIS — S72451A Displaced supracondylar fracture without intracondylar extension of lower end of right femur, initial encounter for closed fracture: Secondary | ICD-10-CM | POA: Diagnosis present

## 2016-11-23 DIAGNOSIS — S62614A Displaced fracture of proximal phalanx of right ring finger, initial encounter for closed fracture: Secondary | ICD-10-CM

## 2016-11-23 DIAGNOSIS — T148XXA Other injury of unspecified body region, initial encounter: Secondary | ICD-10-CM

## 2016-11-23 DIAGNOSIS — S7291XA Unspecified fracture of right femur, initial encounter for closed fracture: Secondary | ICD-10-CM | POA: Diagnosis present

## 2016-11-23 DIAGNOSIS — S12600A Unspecified displaced fracture of seventh cervical vertebra, initial encounter for closed fracture: Principal | ICD-10-CM | POA: Diagnosis present

## 2016-11-23 DIAGNOSIS — S62634A Displaced fracture of distal phalanx of right ring finger, initial encounter for closed fracture: Secondary | ICD-10-CM

## 2016-11-23 DIAGNOSIS — S82101A Unspecified fracture of upper end of right tibia, initial encounter for closed fracture: Secondary | ICD-10-CM | POA: Diagnosis present

## 2016-11-23 DIAGNOSIS — F419 Anxiety disorder, unspecified: Secondary | ICD-10-CM | POA: Diagnosis present

## 2016-11-23 DIAGNOSIS — J449 Chronic obstructive pulmonary disease, unspecified: Secondary | ICD-10-CM | POA: Diagnosis present

## 2016-11-23 DIAGNOSIS — S72401A Unspecified fracture of lower end of right femur, initial encounter for closed fracture: Secondary | ICD-10-CM | POA: Diagnosis present

## 2016-11-23 DIAGNOSIS — S72402A Unspecified fracture of lower end of left femur, initial encounter for closed fracture: Secondary | ICD-10-CM | POA: Diagnosis present

## 2016-11-23 DIAGNOSIS — Z419 Encounter for procedure for purposes other than remedying health state, unspecified: Secondary | ICD-10-CM

## 2016-11-23 DIAGNOSIS — S62639A Displaced fracture of distal phalanx of unspecified finger, initial encounter for closed fracture: Secondary | ICD-10-CM | POA: Diagnosis present

## 2016-11-23 DIAGNOSIS — S82401A Unspecified fracture of shaft of right fibula, initial encounter for closed fracture: Secondary | ICD-10-CM

## 2016-11-23 DIAGNOSIS — F1110 Opioid abuse, uncomplicated: Secondary | ICD-10-CM | POA: Diagnosis present

## 2016-11-23 DIAGNOSIS — IMO0002 Reserved for concepts with insufficient information to code with codable children: Secondary | ICD-10-CM

## 2016-11-23 DIAGNOSIS — F319 Bipolar disorder, unspecified: Secondary | ICD-10-CM | POA: Diagnosis present

## 2016-11-23 HISTORY — DX: Gastric ulcer, unspecified as acute or chronic, without hemorrhage or perforation: K25.9

## 2016-11-23 HISTORY — DX: Low back pain: M54.5

## 2016-11-23 HISTORY — DX: Gastro-esophageal reflux disease without esophagitis: K21.9

## 2016-11-23 HISTORY — DX: Ulcer of esophagus without bleeding: K22.10

## 2016-11-23 HISTORY — DX: Dorsalgia, unspecified: M54.9

## 2016-11-23 HISTORY — DX: Thyrotoxicosis with diffuse goiter without thyrotoxic crisis or storm: E05.00

## 2016-11-23 HISTORY — DX: Hypothyroidism, unspecified: E03.9

## 2016-11-23 HISTORY — DX: Unspecified asthma, uncomplicated: J45.909

## 2016-11-23 HISTORY — DX: Other chronic pain: G89.29

## 2016-11-23 HISTORY — DX: Pneumonia, unspecified organism: J18.9

## 2016-11-23 HISTORY — DX: Low back pain, unspecified: M54.50

## 2016-11-23 MED ORDER — FENTANYL CITRATE (PF) 100 MCG/2ML IJ SOLN
INTRAMUSCULAR | Status: AC | PRN
  Administered 2016-11-23: 100 ug via INTRAVENOUS

## 2016-11-23 MED ORDER — TETANUS-DIPHTH-ACELL PERTUSSIS 5-2.5-18.5 LF-MCG/0.5 IM SUSP
0.5000 mL | Freq: Once | INTRAMUSCULAR | Status: AC
  Administered 2016-11-24: 0.5 mL via INTRAMUSCULAR
  Filled 2016-11-23: qty 0.5

## 2016-11-24 ENCOUNTER — Inpatient Hospital Stay (HOSPITAL_COMMUNITY): Payer: Medicaid Other | Admitting: Certified Registered"

## 2016-11-24 ENCOUNTER — Emergency Department (HOSPITAL_COMMUNITY): Payer: Medicaid Other

## 2016-11-24 ENCOUNTER — Inpatient Hospital Stay (HOSPITAL_COMMUNITY): Payer: Medicaid Other

## 2016-11-24 ENCOUNTER — Encounter (HOSPITAL_COMMUNITY): Payer: Self-pay | Admitting: *Deleted

## 2016-11-24 ENCOUNTER — Encounter (HOSPITAL_COMMUNITY): Admission: EM | Disposition: A | Discharge status: Skilled Nursing Facility | Payer: Self-pay | Source: Home / Self Care

## 2016-11-24 DIAGNOSIS — S32592A Other specified fracture of left pubis, initial encounter for closed fracture: Secondary | ICD-10-CM

## 2016-11-24 DIAGNOSIS — S32810A Multiple fractures of pelvis with stable disruption of pelvic ring, initial encounter for closed fracture: Secondary | ICD-10-CM | POA: Diagnosis present

## 2016-11-24 DIAGNOSIS — F319 Bipolar disorder, unspecified: Secondary | ICD-10-CM | POA: Diagnosis present

## 2016-11-24 DIAGNOSIS — S62613A Displaced fracture of proximal phalanx of left middle finger, initial encounter for closed fracture: Secondary | ICD-10-CM | POA: Diagnosis present

## 2016-11-24 DIAGNOSIS — Y92410 Unspecified street and highway as the place of occurrence of the external cause: Secondary | ICD-10-CM | POA: Diagnosis not present

## 2016-11-24 DIAGNOSIS — E0500 Thyrotoxicosis with diffuse goiter without thyrotoxic crisis or storm: Secondary | ICD-10-CM | POA: Diagnosis present

## 2016-11-24 DIAGNOSIS — J449 Chronic obstructive pulmonary disease, unspecified: Secondary | ICD-10-CM | POA: Diagnosis present

## 2016-11-24 DIAGNOSIS — S82201A Unspecified fracture of shaft of right tibia, initial encounter for closed fracture: Secondary | ICD-10-CM | POA: Diagnosis present

## 2016-11-24 DIAGNOSIS — F1110 Opioid abuse, uncomplicated: Secondary | ICD-10-CM | POA: Diagnosis present

## 2016-11-24 DIAGNOSIS — S72402A Unspecified fracture of lower end of left femur, initial encounter for closed fracture: Secondary | ICD-10-CM

## 2016-11-24 DIAGNOSIS — S3282XA Multiple fractures of pelvis without disruption of pelvic ring, initial encounter for closed fracture: Secondary | ICD-10-CM | POA: Diagnosis present

## 2016-11-24 DIAGNOSIS — S82102A Unspecified fracture of upper end of left tibia, initial encounter for closed fracture: Secondary | ICD-10-CM

## 2016-11-24 DIAGNOSIS — E890 Postprocedural hypothyroidism: Secondary | ICD-10-CM | POA: Diagnosis present

## 2016-11-24 DIAGNOSIS — S32119A Unspecified Zone I fracture of sacrum, initial encounter for closed fracture: Secondary | ICD-10-CM | POA: Diagnosis present

## 2016-11-24 DIAGNOSIS — S82101A Unspecified fracture of upper end of right tibia, initial encounter for closed fracture: Secondary | ICD-10-CM | POA: Diagnosis present

## 2016-11-24 DIAGNOSIS — F419 Anxiety disorder, unspecified: Secondary | ICD-10-CM | POA: Diagnosis present

## 2016-11-24 DIAGNOSIS — S62664A Nondisplaced fracture of distal phalanx of right ring finger, initial encounter for closed fracture: Secondary | ICD-10-CM | POA: Diagnosis present

## 2016-11-24 DIAGNOSIS — IMO0002 Reserved for concepts with insufficient information to code with codable children: Secondary | ICD-10-CM

## 2016-11-24 DIAGNOSIS — F1210 Cannabis abuse, uncomplicated: Secondary | ICD-10-CM | POA: Diagnosis present

## 2016-11-24 DIAGNOSIS — M79604 Pain in right leg: Secondary | ICD-10-CM | POA: Diagnosis present

## 2016-11-24 DIAGNOSIS — D62 Acute posthemorrhagic anemia: Secondary | ICD-10-CM | POA: Diagnosis present

## 2016-11-24 DIAGNOSIS — E559 Vitamin D deficiency, unspecified: Secondary | ICD-10-CM | POA: Diagnosis present

## 2016-11-24 DIAGNOSIS — S7291XA Unspecified fracture of right femur, initial encounter for closed fracture: Secondary | ICD-10-CM | POA: Diagnosis present

## 2016-11-24 DIAGNOSIS — Z79899 Other long term (current) drug therapy: Secondary | ICD-10-CM | POA: Diagnosis not present

## 2016-11-24 DIAGNOSIS — S12600A Unspecified displaced fracture of seventh cervical vertebra, initial encounter for closed fracture: Secondary | ICD-10-CM | POA: Diagnosis present

## 2016-11-24 DIAGNOSIS — S72401A Unspecified fracture of lower end of right femur, initial encounter for closed fracture: Secondary | ICD-10-CM | POA: Diagnosis present

## 2016-11-24 DIAGNOSIS — F17210 Nicotine dependence, cigarettes, uncomplicated: Secondary | ICD-10-CM | POA: Diagnosis present

## 2016-11-24 DIAGNOSIS — S72451A Displaced supracondylar fracture without intracondylar extension of lower end of right femur, initial encounter for closed fracture: Secondary | ICD-10-CM | POA: Diagnosis present

## 2016-11-24 DIAGNOSIS — N3010 Interstitial cystitis (chronic) without hematuria: Secondary | ICD-10-CM | POA: Diagnosis present

## 2016-11-24 DIAGNOSIS — S82831A Other fracture of upper and lower end of right fibula, initial encounter for closed fracture: Secondary | ICD-10-CM | POA: Diagnosis present

## 2016-11-24 HISTORY — PX: OPEN REDUCTION INTERNAL FIXATION (ORIF) HAND: SHX5991

## 2016-11-24 HISTORY — PX: FASCIOTOMY: SHX132

## 2016-11-24 HISTORY — PX: I & D EXTREMITY: SHX5045

## 2016-11-24 HISTORY — PX: EXTERNAL FIXATION LEG: SHX1549

## 2016-11-24 LAB — COMPREHENSIVE METABOLIC PANEL WITH GFR
ALT: 132 U/L — ABNORMAL HIGH (ref 14–54)
AST: 161 U/L — ABNORMAL HIGH (ref 15–41)
Albumin: 3.6 g/dL (ref 3.5–5.0)
Alkaline Phosphatase: 79 U/L (ref 38–126)
Anion gap: 8 (ref 5–15)
BUN: 7 mg/dL (ref 6–20)
CO2: 25 mmol/L (ref 22–32)
Calcium: 8.8 mg/dL — ABNORMAL LOW (ref 8.9–10.3)
Chloride: 109 mmol/L (ref 101–111)
Creatinine, Ser: 0.83 mg/dL (ref 0.44–1.00)
GFR calc Af Amer: >60 mL/min
GFR calc non Af Amer: >60 mL/min
Glucose, Bld: 108 mg/dL — ABNORMAL HIGH (ref 65–99)
Potassium: 3.9 mmol/L (ref 3.5–5.1)
Sodium: 142 mmol/L (ref 135–145)
Total Bilirubin: 0.3 mg/dL (ref 0.3–1.2)
Total Protein: 6.1 g/dL — ABNORMAL LOW (ref 6.5–8.1)

## 2016-11-24 LAB — SURGICAL PCR SCREEN
MRSA, PCR: NEGATIVE
STAPHYLOCOCCUS AUREUS: NEGATIVE

## 2016-11-24 LAB — COMPREHENSIVE METABOLIC PANEL
ALT: 122 U/L — ABNORMAL HIGH (ref 14–54)
ANION GAP: 8 (ref 5–15)
AST: 148 U/L — ABNORMAL HIGH (ref 15–41)
Albumin: 3 g/dL — ABNORMAL LOW (ref 3.5–5.0)
Alkaline Phosphatase: 71 U/L (ref 38–126)
BILIRUBIN TOTAL: 0.7 mg/dL (ref 0.3–1.2)
BUN: 8 mg/dL (ref 6–20)
CO2: 23 mmol/L (ref 22–32)
Calcium: 8.4 mg/dL — ABNORMAL LOW (ref 8.9–10.3)
Chloride: 110 mmol/L (ref 101–111)
Creatinine, Ser: 0.72 mg/dL (ref 0.44–1.00)
GFR calc Af Amer: >60 mL/min (ref >60)
Glucose, Bld: 127 mg/dL — ABNORMAL HIGH (ref 65–99)
POTASSIUM: 4.2 mmol/L (ref 3.5–5.1)
Sodium: 141 mmol/L (ref 135–145)
TOTAL PROTEIN: 5.5 g/dL — AB (ref 6.5–8.1)

## 2016-11-24 LAB — CBC
HEMATOCRIT: 36.5 % (ref 36.0–46.0)
HEMATOCRIT: 42.6 % (ref 36.0–46.0)
Hemoglobin: 11.9 g/dL — ABNORMAL LOW (ref 12.0–15.0)
Hemoglobin: 14.4 g/dL (ref 12.0–15.0)
MCH: 29.8 pg (ref 26.0–34.0)
MCH: 30.8 pg (ref 26.0–34.0)
MCHC: 32.6 g/dL (ref 30.0–36.0)
MCHC: 33.8 g/dL (ref 30.0–36.0)
MCV: 91.2 fL (ref 78.0–100.0)
MCV: 91.5 fL (ref 78.0–100.0)
PLATELETS: 289 10*3/uL (ref 150–400)
Platelets: 236 10*3/uL (ref 150–400)
RBC: 3.99 MIL/uL (ref 3.87–5.11)
RBC: 4.67 MIL/uL (ref 3.87–5.11)
RDW: 13 % (ref 11.5–15.5)
RDW: 13.3 % (ref 11.5–15.5)
WBC: 12.2 10*3/uL — AB (ref 4.0–10.5)
WBC: 20.4 10*3/uL — AB (ref 4.0–10.5)

## 2016-11-24 LAB — URINALYSIS, ROUTINE W REFLEX MICROSCOPIC
Bilirubin Urine: NEGATIVE
Glucose, UA: NEGATIVE mg/dL
Ketones, ur: NEGATIVE mg/dL
Leukocytes, UA: NEGATIVE
Nitrite: NEGATIVE
Protein, ur: NEGATIVE mg/dL
Specific Gravity, Urine: >1.046 — ABNORMAL HIGH (ref 1.005–1.030)
pH: 6 (ref 5.0–8.0)

## 2016-11-24 LAB — I-STAT CG4 LACTIC ACID, ED: LACTIC ACID, VENOUS: 1.63 mmol/L (ref 0.5–1.9)

## 2016-11-24 LAB — I-STAT CHEM 8, ED
BUN: 7 mg/dL (ref 6–20)
CALCIUM ION: 1.06 mmol/L — AB (ref 1.15–1.40)
Chloride: 107 mmol/L (ref 101–111)
Creatinine, Ser: 0.9 mg/dL (ref 0.44–1.00)
Glucose, Bld: 104 mg/dL — ABNORMAL HIGH (ref 65–99)
HCT: 41 % (ref 36.0–46.0)
HEMOGLOBIN: 13.9 g/dL (ref 12.0–15.0)
POTASSIUM: 4 mmol/L (ref 3.5–5.1)
SODIUM: 143 mmol/L (ref 135–145)
TCO2: 26 mmol/L (ref 0–100)

## 2016-11-24 LAB — RAPID URINE DRUG SCREEN, HOSP PERFORMED
Amphetamines: NOT DETECTED
BENZODIAZEPINES: NOT DETECTED
Barbiturates: NOT DETECTED
COCAINE: NOT DETECTED
Opiates: POSITIVE — AB
Tetrahydrocannabinol: POSITIVE — AB

## 2016-11-24 LAB — SAMPLE TO BLOOD BANK

## 2016-11-24 LAB — PROTIME-INR
INR: 1.03
Prothrombin Time: 13.5 s (ref 11.4–15.2)

## 2016-11-24 LAB — ETHANOL: Alcohol, Ethyl (B): 118 mg/dL — ABNORMAL HIGH

## 2016-11-24 SURGERY — EXTERNAL FIXATION, LOWER EXTREMITY
Anesthesia: General | Site: Leg Lower | Laterality: Right

## 2016-11-24 MED ORDER — CLONAZEPAM 1 MG PO TABS
1.0000 mg | ORAL_TABLET | Freq: Every day | ORAL | Status: DC
  Administered 2016-11-24 – 2016-11-28 (×5): 1 mg via ORAL
  Filled 2016-11-24 (×5): qty 1

## 2016-11-24 MED ORDER — ALBUTEROL SULFATE HFA 108 (90 BASE) MCG/ACT IN AERS
INHALATION_SPRAY | RESPIRATORY_TRACT | Status: AC
  Filled 2016-11-24: qty 6.7

## 2016-11-24 MED ORDER — KETAMINE HCL 10 MG/ML IJ SOLN
INTRAMUSCULAR | Status: DC | PRN
  Administered 2016-11-24 (×2): 10 mg via INTRAVENOUS
  Administered 2016-11-24: 20 mg via INTRAVENOUS
  Administered 2016-11-24: 10 mg via INTRAVENOUS

## 2016-11-24 MED ORDER — ONDANSETRON HCL 4 MG/2ML IJ SOLN: 4.0000 mg | Freq: Four times a day (QID) | INTRAMUSCULAR | Status: DC | PRN

## 2016-11-24 MED ORDER — LACTATED RINGERS IV SOLN
INTRAVENOUS | Status: DC | PRN
  Administered 2016-11-24 (×2): via INTRAVENOUS

## 2016-11-24 MED ORDER — HYDROMORPHONE HCL 2 MG/ML IJ SOLN
1.0000 mg | INTRAMUSCULAR | Status: DC | PRN
  Administered 2016-11-24 – 2016-11-26 (×19): 2 mg via INTRAVENOUS
  Filled 2016-11-24 (×19): qty 1

## 2016-11-24 MED ORDER — ALBUTEROL SULFATE (2.5 MG/3ML) 0.083% IN NEBU
3.0000 mL | INHALATION_SOLUTION | RESPIRATORY_TRACT | Status: DC | PRN
  Administered 2016-11-25 – 2016-11-27 (×3): 3 mL via RESPIRATORY_TRACT
  Filled 2016-11-24 (×4): qty 3

## 2016-11-24 MED ORDER — ONDANSETRON HCL 4 MG PO TABS: 4.0000 mg | ORAL_TABLET | Freq: Four times a day (QID) | ORAL | Status: DC | PRN

## 2016-11-24 MED ORDER — INFLUENZA VAC SPLIT QUAD 0.5 ML IM SUSY
0.5000 mL | PREFILLED_SYRINGE | INTRAMUSCULAR | Status: AC
  Administered 2016-11-29: 0.5 mL via INTRAMUSCULAR
  Filled 2016-11-24: qty 0.5

## 2016-11-24 MED ORDER — HYDROMORPHONE HCL 2 MG/ML IJ SOLN
1.0000 mg | INTRAMUSCULAR | Status: DC | PRN
  Administered 2016-11-24 (×2): 1 mg via INTRAVENOUS
  Filled 2016-11-24 (×2): qty 1

## 2016-11-24 MED ORDER — HYDROMORPHONE HCL 2 MG/ML IJ SOLN
INTRAMUSCULAR | Status: AC
  Filled 2016-11-24: qty 1

## 2016-11-24 MED ORDER — PROPOFOL 10 MG/ML IV BOLUS
INTRAVENOUS | Status: DC | PRN
  Administered 2016-11-24: 140 mg via INTRAVENOUS
  Administered 2016-11-24: 60 mg via INTRAVENOUS

## 2016-11-24 MED ORDER — CEFAZOLIN SODIUM-DEXTROSE 2-4 GM/100ML-% IV SOLN
INTRAVENOUS | Status: AC
  Filled 2016-11-24: qty 100

## 2016-11-24 MED ORDER — SODIUM CHLORIDE 0.9 % IV SOLN
INTRAVENOUS | Status: DC
  Administered 2016-11-24 – 2016-11-30 (×8): via INTRAVENOUS

## 2016-11-24 MED ORDER — FENTANYL CITRATE (PF) 100 MCG/2ML IJ SOLN
INTRAMUSCULAR | Status: AC
  Filled 2016-11-24: qty 2

## 2016-11-24 MED ORDER — MIDAZOLAM HCL 2 MG/2ML IJ SOLN
INTRAMUSCULAR | Status: DC | PRN
  Administered 2016-11-24 (×4): 0.5 mg via INTRAVENOUS

## 2016-11-24 MED ORDER — NICOTINE 14 MG/24HR TD PT24
14.0000 mg | MEDICATED_PATCH | Freq: Every day | TRANSDERMAL | Status: DC
  Administered 2016-11-24 – 2016-11-25 (×2): 14 mg via TRANSDERMAL
  Filled 2016-11-24 (×2): qty 1

## 2016-11-24 MED ORDER — MORPHINE SULFATE (PF) 4 MG/ML IV SOLN
4.0000 mg | Freq: Once | INTRAVENOUS | Status: AC
  Administered 2016-11-24: 4 mg via INTRAVENOUS
  Filled 2016-11-24: qty 1

## 2016-11-24 MED ORDER — PANTOPRAZOLE SODIUM 40 MG PO TBEC
40.0000 mg | DELAYED_RELEASE_TABLET | Freq: Every day | ORAL | Status: DC
  Administered 2016-11-25 – 2016-11-28 (×4): 40 mg via ORAL
  Filled 2016-11-24 (×4): qty 1

## 2016-11-24 MED ORDER — SUCCINYLCHOLINE CHLORIDE 200 MG/10ML IV SOSY
PREFILLED_SYRINGE | INTRAVENOUS | Status: DC | PRN
  Administered 2016-11-24: 100 mg via INTRAVENOUS

## 2016-11-24 MED ORDER — MORPHINE SULFATE (PF) 2 MG/ML IV SOLN
1.0000 mg | INTRAVENOUS | Status: DC | PRN
  Administered 2016-11-24 (×3): 4 mg via INTRAVENOUS
  Filled 2016-11-24 (×3): qty 2

## 2016-11-24 MED ORDER — OXYCODONE-ACETAMINOPHEN 5-325 MG PO TABS
1.0000 | ORAL_TABLET | ORAL | Status: DC | PRN
  Administered 2016-11-24 – 2016-11-25 (×4): 2 via ORAL
  Filled 2016-11-24 (×5): qty 2

## 2016-11-24 MED ORDER — ONDANSETRON HCL 4 MG/2ML IJ SOLN
INTRAMUSCULAR | Status: AC
  Filled 2016-11-24: qty 2

## 2016-11-24 MED ORDER — FENTANYL CITRATE (PF) 100 MCG/2ML IJ SOLN
25.0000 ug | INTRAMUSCULAR | Status: DC | PRN
  Administered 2016-11-24 (×3): 50 ug via INTRAVENOUS

## 2016-11-24 MED ORDER — SODIUM CHLORIDE 0.9 % IV BOLUS (SEPSIS)
1000.0000 mL | Freq: Once | INTRAVENOUS | Status: AC
  Administered 2016-11-24: 1000 mL via INTRAVENOUS

## 2016-11-24 MED ORDER — HYDROMORPHONE HCL 2 MG/ML IJ SOLN
0.5000 mg | Freq: Once | INTRAMUSCULAR | Status: AC
  Administered 2016-11-24: 0.5 mg via INTRAVENOUS
  Filled 2016-11-24: qty 1

## 2016-11-24 MED ORDER — ONDANSETRON HCL 4 MG/2ML IJ SOLN
INTRAMUSCULAR | Status: DC | PRN
  Administered 2016-11-24: 4 mg via INTRAVENOUS

## 2016-11-24 MED ORDER — HYDROMORPHONE HCL 2 MG/ML IJ SOLN
0.5000 mg | Freq: Once | INTRAMUSCULAR | Status: AC
  Administered 2016-11-24: 0.5 mg via INTRAVENOUS

## 2016-11-24 MED ORDER — IOPAMIDOL (ISOVUE-300) INJECTION 61%
100.0000 mL | Freq: Once | INTRAVENOUS | Status: AC | PRN
  Administered 2016-11-24: 100 mL via INTRAVENOUS

## 2016-11-24 MED ORDER — FENTANYL CITRATE (PF) 100 MCG/2ML IJ SOLN
50.0000 ug | Freq: Once | INTRAMUSCULAR | Status: AC
  Administered 2016-11-24: 50 ug via INTRAVENOUS

## 2016-11-24 MED ORDER — PROPOFOL 10 MG/ML IV BOLUS
INTRAVENOUS | Status: AC
  Filled 2016-11-24: qty 20

## 2016-11-24 MED ORDER — LIDOCAINE 2% (20 MG/ML) 5 ML SYRINGE
INTRAMUSCULAR | Status: DC | PRN
  Administered 2016-11-24: 40 mg via INTRAVENOUS

## 2016-11-24 MED ORDER — PANTOPRAZOLE SODIUM 40 MG IV SOLR: 40.0000 mg | Freq: Every day | INTRAVENOUS | Status: DC

## 2016-11-24 MED ORDER — MIDAZOLAM HCL 2 MG/2ML IJ SOLN
INTRAMUSCULAR | Status: AC
  Filled 2016-11-24: qty 2

## 2016-11-24 MED ORDER — ONDANSETRON HCL 4 MG/2ML IJ SOLN: 4.0000 mg | Freq: Once | INTRAMUSCULAR | Status: DC | PRN

## 2016-11-24 MED ORDER — LIDOCAINE 2% (20 MG/ML) 5 ML SYRINGE
INTRAMUSCULAR | Status: AC
  Filled 2016-11-24: qty 5

## 2016-11-24 MED ORDER — 0.9 % SODIUM CHLORIDE (POUR BTL) OPTIME
TOPICAL | Status: DC | PRN
  Administered 2016-11-24: 1000 mL

## 2016-11-24 MED ORDER — FENTANYL CITRATE (PF) 100 MCG/2ML IJ SOLN
INTRAMUSCULAR | Status: DC | PRN
  Administered 2016-11-24 (×4): 25 ug via INTRAVENOUS
  Administered 2016-11-24 (×2): 100 ug via INTRAVENOUS

## 2016-11-24 MED ORDER — SUCCINYLCHOLINE CHLORIDE 200 MG/10ML IV SOSY
PREFILLED_SYRINGE | INTRAVENOUS | Status: AC
  Filled 2016-11-24: qty 10

## 2016-11-24 MED ORDER — CEFAZOLIN SODIUM-DEXTROSE 2-3 GM-% IV SOLR
INTRAVENOUS | Status: DC | PRN
  Administered 2016-11-24: 2 g via INTRAVENOUS

## 2016-11-24 MED ORDER — PNEUMOCOCCAL VAC POLYVALENT 25 MCG/0.5ML IJ INJ
0.5000 mL | INJECTION | INTRAMUSCULAR | Status: DC
  Filled 2016-11-24: qty 0.5

## 2016-11-24 MED ORDER — ALBUTEROL SULFATE HFA 108 (90 BASE) MCG/ACT IN AERS
INHALATION_SPRAY | RESPIRATORY_TRACT | Status: DC | PRN
  Administered 2016-11-24: 4 via RESPIRATORY_TRACT

## 2016-11-24 MED ORDER — KETAMINE HCL-SODIUM CHLORIDE 100-0.9 MG/10ML-% IV SOSY
PREFILLED_SYRINGE | INTRAVENOUS | Status: AC
  Filled 2016-11-24: qty 10

## 2016-11-24 MED ORDER — BACITRACIN ZINC 500 UNIT/GM EX OINT
TOPICAL_OINTMENT | CUTANEOUS | Status: AC
  Filled 2016-11-24: qty 28.35

## 2016-11-24 MED ORDER — CEFAZOLIN IN D5W 1 GM/50ML IV SOLN
1.0000 g | Freq: Three times a day (TID) | INTRAVENOUS | Status: AC
  Administered 2016-11-24 – 2016-11-25 (×3): 1 g via INTRAVENOUS
  Filled 2016-11-24 (×3): qty 50

## 2016-11-24 SURGICAL SUPPLY — 56 items
BANDAGE COBAN STERILE 2 (GAUZE/BANDAGES/DRESSINGS) ×2 IMPLANT
BANDAGE ELASTIC 4 VELCRO ST LF (GAUZE/BANDAGES/DRESSINGS) ×2 IMPLANT
BAR EXFX 600X11 NS LF (EXFIX) ×6
BAR GLASS FIBER EXFX 11X600 (EXFIX) ×4 IMPLANT
BIT DRILL 1.1 (BIT) ×4
BIT DRILL 1.1MM (BIT) ×1
BIT DRILL 2 FAST STEP (BIT) ×2 IMPLANT
BIT DRILL 60X20X1.1XQC TMX (BIT) IMPLANT
BIT DRL 60X20X1.1XQC TMX (BIT) ×3
BNDG COHESIVE 1X5 TAN STRL LF (GAUZE/BANDAGES/DRESSINGS) ×2 IMPLANT
BNDG CONFORM 2 STRL LF (GAUZE/BANDAGES/DRESSINGS) ×4 IMPLANT
COVER SURGICAL LIGHT HANDLE (MISCELLANEOUS) ×5 IMPLANT
DRAPE C-ARM 42X72 X-RAY (DRAPES) ×5 IMPLANT
DRAPE OEC MINIVIEW 54X84 (DRAPES) ×2 IMPLANT
DRAPE ORTHO SPLIT 77X108 STRL (DRAPES) ×10
DRAPE SURG ORHT 6 SPLT 77X108 (DRAPES) ×6 IMPLANT
DRAPE U-SHAPE 47X51 STRL (DRAPES) ×7 IMPLANT
DRSG EMULSION OIL 3X3 NADH (GAUZE/BANDAGES/DRESSINGS) ×4 IMPLANT
DURAPREP 26ML APPLICATOR (WOUND CARE) ×7 IMPLANT
ELECT REM PT RETURN 9FT ADLT (ELECTROSURGICAL) ×5
ELECTRODE REM PT RTRN 9FT ADLT (ELECTROSURGICAL) IMPLANT
GAUZE SPONGE 4X4 12PLY STRL (GAUZE/BANDAGES/DRESSINGS) ×6 IMPLANT
GAUZE XEROFORM 1X8 LF (GAUZE/BANDAGES/DRESSINGS) ×2 IMPLANT
GAUZE XEROFORM 5X9 LF (GAUZE/BANDAGES/DRESSINGS) ×2 IMPLANT
GLOVE BIOGEL M 8.0 STRL (GLOVE) ×4 IMPLANT
GLOVE ORTHO TXT STRL SZ7.5 (GLOVE) ×5 IMPLANT
GLOVE SURG ORTHO 8.0 STRL STRW (GLOVE) ×2 IMPLANT
GOWN STRL REUS W/ TWL LRG LVL3 (GOWN DISPOSABLE) ×3 IMPLANT
GOWN STRL REUS W/ TWL XL LVL3 (GOWN DISPOSABLE) ×12 IMPLANT
GOWN STRL REUS W/TWL LRG LVL3 (GOWN DISPOSABLE) ×15
GOWN STRL REUS W/TWL XL LVL3 (GOWN DISPOSABLE) ×20
HALF PIN 5.0X160 (EXFIX) ×4 IMPLANT
KIT BASIN OR (CUSTOM PROCEDURE TRAY) ×5 IMPLANT
KIT ROOM TURNOVER OR (KITS) ×5 IMPLANT
NS IRRIG 1000ML POUR BTL (IV SOLUTION) ×5 IMPLANT
PACK ORTHO EXTREMITY (CUSTOM PROCEDURE TRAY) ×5 IMPLANT
PAD ABD 8X10 STRL (GAUZE/BANDAGES/DRESSINGS) ×2 IMPLANT
PAD ARMBOARD 7.5X6 YLW CONV (MISCELLANEOUS) ×10 IMPLANT
PAD CAST 4YDX4 CTTN HI CHSV (CAST SUPPLIES) IMPLANT
PADDING CAST COTTON 4X4 STRL (CAST SUPPLIES) ×10
PILLOW ARM CARTER ADULT (MISCELLANEOUS) ×2 IMPLANT
PIN CLAMP 2BAR 75MM BLUE (EXFIX) ×4 IMPLANT
PIN HALF YELLOW 5X160X35 (EXFIX) ×4 IMPLANT
SCOTCHCAST PLUS 2X4 WHITE (CAST SUPPLIES) ×2 IMPLANT
SCREW 1.5X15MM (Screw) ×4 IMPLANT
SCREW NL 1.5X13 (Screw) ×2 IMPLANT
SCREW NONIOC 1.5 14M (Screw) ×2 IMPLANT
SCREW PEG 2.5X16 NONLOCK (Screw) ×2 IMPLANT
SCREW PEG 2.5X18 NONLOCK (Screw) ×2 IMPLANT
SPONGE LAP 18X18 X RAY DECT (DISPOSABLE) ×5 IMPLANT
SUT CHROMIC 5 0 P 3 (SUTURE) ×2 IMPLANT
SUT ETHILON 2 0 PSLX (SUTURE) ×8 IMPLANT
TOWEL OR 17X24 6PK STRL BLUE (TOWEL DISPOSABLE) ×5 IMPLANT
TOWEL OR 17X26 10 PK STRL BLUE (TOWEL DISPOSABLE) ×7 IMPLANT
UNDERPAD 30X30 (UNDERPADS AND DIAPERS) ×7 IMPLANT
WASHER 2.5 THREADED (Orthopedic Implant) ×2 IMPLANT
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
## 2016-11-24 NOTE — ED Notes (Signed)
Pt taken to CT with RN 

## 2016-11-24 NOTE — ED Notes (Signed)
Updated pt's daughter, Charlotte SanesSavannah, [phone]

## 2016-11-24 NOTE — ED Notes (Signed)
Pt returned from CT, R foot noted to be red, cool, pulses present but weaker than previously assessed
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
## 2016-11-24 NOTE — Progress Notes (Signed)
Received patient from ED. Patient AOx4, on Aspen collar, pain at 10/10 at RLE, VS stable but PR is tachycardic at 121, O2Sat at 94 RA, placed on telemetry and continuous pulse oximeter per order.  Administered PRN pain medication morphine 4mg  Inj per order.  Daughter at bedside and will closely monitor.
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
## 2016-11-24 NOTE — ED Notes (Signed)
EMS collar removed, aspen collar placed

## 2016-11-24 NOTE — ED Notes (Signed)
Ortho tech at bedside for static finger splints

## 2016-11-24 NOTE — Brief Op Note (Signed)
11/23/2016 - 11/24/2016  10:57 AM  PATIENT:  [name]  46 y.o. female  PRE-OPERATIVE DIAGNOSIS:  RIGHT TIBIA FRACTURE, RIGHT FEMUR FRACTURE, BILATERAL HAND INJURIES  POST-OPERATIVE DIAGNOSIS:  RIGHT TIBIA FRACTURE, RIGHT FEMUR FRACTURE, BILATERAL HAND INJURIES  PROCEDURE:  Procedure(s): EXTERNAL FIXATION SPANNING RIGHT LEG (Right) OPEN REDUCTION INTERNAL FIXATION (ORIF) HAND (Bilateral) FOUR COMPARTMENT FASCIOTOMIES  RIGHT LOWER EXTREMITY (Right)  SURGEON:  Surgeon(s) and Role: Panel 1:    * Kathryne Hitchhristopher Y Venisha Boehning, MD - Primary  Panel 2:    * Dominica SeverinWilliam Gramig, MD - Primary  ANESTHESIA:   general  EBL:  Total I/O In: 1400 [I.V.:1400] Out: 490 [Urine:490]  COUNTS:  YES  TOURNIQUET:  none  DICTATION: .Other Dictation: Dictation Number [id number]  PLAN OF CARE: Admit to inpatient   PATIENT DISPOSITION:  PACU - hemodynamically stable.   Delay start of Pharmacological VTE agent (>24hrs) due to surgical blood loss or risk of bleeding: no
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
## 2016-11-24 NOTE — Progress Notes (Signed)
Patient ID: [name], female   DOB: 11-Jan-1971, 46 y.o.   MRN: [id number] She does have a left pubis fracture as well as a right root of the ramus fracture seen on CT.  She also has a right sacral ala fracture.  There is no noted widening of her SI joints or symphysis.  Can likely treat the pelvic fractures non-operatively.

## 2016-11-24 NOTE — Anesthesia Postprocedure Evaluation (Signed)
Anesthesia Post Note  Patient: [name]  Procedure(s) Performed: Procedure(s) (LRB): EXTERNAL FIXATION SPANNING RIGHT LEG (Right) OPEN REDUCTION INTERNAL FIXATION LEFT MIDDLE FINGER (Left) FOUR COMPARTMENT FASCIOTOMIES  RIGHT LOWER EXTREMITY (Right) IRRIGATION AND DEBRIDEMENT RIGHT HAND WITH PARTIAL NAIL REMOVAL TO RING FINGER AND THUMB (Right)  Patient location during evaluation: PACU Anesthesia Type: General Level of consciousness: awake, oriented and awake and alert Pain management: pain level controlled Vital Signs Assessment: post-procedure vital signs reviewed and stable Respiratory status: spontaneous breathing, nonlabored ventilation, respiratory function stable and patient connected to nasal cannula oxygen Cardiovascular status: blood pressure returned to baseline Anesthetic complications: no       Last Vitals:  Vitals:   11/24/16 1230 11/24/16 1253  BP: 124/86 136/90  Pulse: 76 93  Resp: (!) 21   Temp:  37.1 C    Last Pain:  Vitals:   11/24/16 1316  TempSrc:   PainSc: 10-Worst pain ever                 Shantese Raven COKER

## 2016-11-24 NOTE — Op Note (Signed)
See Dictation#[id number]  Status post ORIF left hand (middle finger). Status post I and the right hand with close treatment ring finger distal phalanx fracture.  Moving forward she should be nonweightbearing wrist and hand left upper extremity but may bear weight with platform marker and through her elbow on the left side. Her right side in terms of her right upper extremity is full weightbearing no restrictions. We will keep her ring finger distal phalanx comfortable with a protective splint.  All questions have been encouraged and answered.  Vallery Mcdade M.D.

## 2016-11-24 NOTE — Transfer of Care (Signed)
Immediate Anesthesia Transfer of Care Note  Patient: [name]  Procedure(s) Performed: Procedure(s): EXTERNAL FIXATION SPANNING RIGHT LEG (Right) OPEN REDUCTION INTERNAL FIXATION LEFT MIDDLE FINGER (Left) FOUR COMPARTMENT FASCIOTOMIES  RIGHT LOWER EXTREMITY (Right) IRRIGATION AND DEBRIDEMENT RIGHT HAND WITH PARTIAL NAIL REMOVAL TO RING FINGER AND THUMB (Right)  Patient Location: PACU  Anesthesia Type:General  Level of Consciousness: awake  Airway & Oxygen Therapy: Patient Spontanous Breathing and Patient connected to nasal cannula oxygen  Post-op Assessment: Report given to RN and Post -op Vital signs reviewed and stable  Post vital signs: Reviewed and stable  Last Vitals:  Vitals:   11/24/16 0354 11/24/16 0830  BP: 123/90 119/88  Pulse: 84 92  Resp: 15 16  Temp: 36.8 C 36.8 C    Last Pain:  Vitals:   11/24/16 0830  TempSrc: Oral  PainSc:          Complications: No apparent anesthesia complications
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
## 2016-11-24 NOTE — Progress Notes (Signed)
PT Cancellation Note  Patient Details Name: [name] MRN: [id number] DOB: 11/06/1971   Cancelled Treatment:    Reason Eval/Treat Not Completed: Patient at procedure or test/unavailable. Patient in OR today.  Will return tomorrow for PT evaluation as appropriate.   Vena AustriaSusan H Jessi Pitstick 11/24/2016, 12:42 PM Durenda HurtSusan H. Renaldo Fiddleravis, PT, [facility] Acute Rehab Services Pager [phone]

## 2016-11-25 LAB — COMPREHENSIVE METABOLIC PANEL
ALT: 59 U/L — ABNORMAL HIGH (ref 14–54)
ANION GAP: 4 — AB (ref 5–15)
AST: 63 U/L — ABNORMAL HIGH (ref 15–41)
Albumin: 2.2 g/dL — ABNORMAL LOW (ref 3.5–5.0)
Alkaline Phosphatase: 52 U/L (ref 38–126)
BILIRUBIN TOTAL: 1 mg/dL (ref 0.3–1.2)
BUN: 5 mg/dL — ABNORMAL LOW (ref 6–20)
CO2: 26 mmol/L (ref 22–32)
Calcium: 7.6 mg/dL — ABNORMAL LOW (ref 8.9–10.3)
Chloride: 105 mmol/L (ref 101–111)
Creatinine, Ser: 0.57 mg/dL (ref 0.44–1.00)
GFR calc Af Amer: >60 mL/min (ref >60)
Glucose, Bld: 127 mg/dL — ABNORMAL HIGH (ref 65–99)
Potassium: 3.5 mmol/L (ref 3.5–5.1)
Sodium: 135 mmol/L (ref 135–145)
TOTAL PROTEIN: 4.3 g/dL — AB (ref 6.5–8.1)

## 2016-11-25 LAB — CBC
HEMATOCRIT: 25.9 % — AB (ref 36.0–46.0)
Hemoglobin: 8.7 g/dL — ABNORMAL LOW (ref 12.0–15.0)
MCH: 30.7 pg (ref 26.0–34.0)
MCHC: 33.6 g/dL (ref 30.0–36.0)
MCV: 91.5 fL (ref 78.0–100.0)
Platelets: 141 10*3/uL — ABNORMAL LOW (ref 150–400)
RBC: 2.83 MIL/uL — ABNORMAL LOW (ref 3.87–5.11)
RDW: 12.8 % (ref 11.5–15.5)
WBC: 5.5 10*3/uL (ref 4.0–10.5)

## 2016-11-25 MED ORDER — ACETAMINOPHEN 325 MG PO TABS
650.0000 mg | ORAL_TABLET | Freq: Four times a day (QID) | ORAL | Status: DC
  Administered 2016-11-25 – 2016-11-28 (×10): 650 mg via ORAL
  Filled 2016-11-25 (×9): qty 2

## 2016-11-25 MED ORDER — METHOCARBAMOL 500 MG PO TABS
500.0000 mg | ORAL_TABLET | Freq: Three times a day (TID) | ORAL | Status: DC
  Administered 2016-11-25 – 2016-11-27 (×7): 500 mg via ORAL
  Filled 2016-11-25 (×7): qty 1

## 2016-11-25 MED ORDER — BISACODYL 5 MG PO TBEC
5.0000 mg | DELAYED_RELEASE_TABLET | Freq: Two times a day (BID) | ORAL | Status: DC
  Administered 2016-11-25 – 2016-11-29 (×9): 5 mg via ORAL
  Filled 2016-11-25 (×9): qty 1

## 2016-11-25 MED ORDER — MOMETASONE FURO-FORMOTEROL FUM 200-5 MCG/ACT IN AERO
2.0000 | INHALATION_SPRAY | Freq: Two times a day (BID) | RESPIRATORY_TRACT | Status: DC
  Administered 2016-11-25 – 2016-12-06 (×16): 2 via RESPIRATORY_TRACT
  Filled 2016-11-25 (×4): qty 8.8

## 2016-11-25 MED ORDER — OXYCODONE HCL 5 MG PO TABS
5.0000 mg | ORAL_TABLET | ORAL | Status: DC | PRN
  Administered 2016-11-25 – 2016-11-27 (×12): 10 mg via ORAL
  Filled 2016-11-25 (×12): qty 2
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
## 2016-11-25 NOTE — Consult Note (Signed)
Patient is seen and examined. Patient is stable from a hand surgery standpoint regarding her hands. She is full weightbearing right upper extremity including the hand. She will keep the bandage on the right hand. We'll change this in the next day or so. She is nonweightbearing left hand due to ORIF of her middle finger.  We'll change her bandage in a week to 2 weeks on the left side and on the right side will begin changing this in the next day or so.  Her right lower extremity reconstruction plans are noted  Yachet Mattson M.D.

## 2016-11-25 NOTE — Progress Notes (Signed)
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  [phone]

## 2016-11-25 NOTE — Progress Notes (Signed)
1530 Pt refused PT today due to pain. Pt has been requesting Dilaudid IV every 2hrs and Oxycodone every 4hrs, given with a little relief per pt. Repositioned to sides but pt refused to stay longer than 30 min.

## 2016-11-25 NOTE — Progress Notes (Signed)
PT Cancellation Note  Patient Details Name: [name] MRN: [id number] DOB: 03/16/1971   Cancelled Treatment:    Reason Eval/Treat Not Completed: Pain limiting ability to participate. Pt just received pain medication and is in too much pain to participate. Family in room and have been assisting with ROM in LLE. Will continue to attempt evaluation.    Colin BroachSabra M. Vincenzina Jagoda PT, DPT  [phone]  11/25/2016, 3:20 PM

## 2016-11-25 NOTE — Progress Notes (Signed)
Subjective: 1 Day Post-Op Procedure(s) (LRB): EXTERNAL FIXATION SPANNING RIGHT LEG (Right) OPEN REDUCTION INTERNAL FIXATION LEFT MIDDLE FINGER (Left) FOUR COMPARTMENT FASCIOTOMIES  RIGHT LOWER EXTREMITY (Right) IRRIGATION AND DEBRIDEMENT RIGHT HAND WITH PARTIAL NAIL REMOVAL TO RING FINGER AND THUMB (Right) Patient reports pain as moderate.    Objective: Vital signs in last 24 hours: Temp:  [97.8 F (36.6 C)-99.5 F (37.5 C)] 99.2 F (37.3 C) (01/14 0507) Pulse Rate:  [76-111] 93 (01/14 0507) Resp:  [17-34] 19 (01/14 0507) BP: (120-140)/(67-90) 126/81 (01/14 0507) SpO2:  [94 %-100 %] 94 % (01/14 0507)  Intake/Output from previous day: 01/13 0701 - 01/14 0700 In: 3335.8 [P.O.:240; I.V.:2995.8; IV Piggyback:100] Out: 2805 [Urine:2730; Blood:75] Intake/Output this shift: No intake/output data recorded.   Recent Labs  11/24/16 0004 11/24/16 0010 11/24/16 0433  HGB 14.4 13.9 11.9*    Recent Labs  11/24/16 0004 11/24/16 0010 11/24/16 0433  WBC 20.4*  --  12.2*  RBC 4.67  --  3.99  HCT 42.6 41.0 36.5  PLT 289  --  236    Recent Labs  11/24/16 0004 11/24/16 0010 11/24/16 0433  NA 142 143 141  K 3.9 4.0 4.2  CL 109 107 110  CO2 25  --  23  BUN 7 7 8   CREATININE 0.83 0.90 0.72  GLUCOSE 108* 104* 127*  CALCIUM 8.8*  --  8.4*    Recent Labs  11/24/16 0004  INR 1.03   Right LE: Intact pulses distally Incision: scant drainage Compartment soft  Assessment/Plan: 1 Day Post-Op Procedure(s) (LRB): EXTERNAL FIXATION SPANNING RIGHT LEG (Right) OPEN REDUCTION INTERNAL FIXATION LEFT MIDDLE FINGER (Left) FOUR COMPARTMENT FASCIOTOMIES  RIGHT LOWER EXTREMITY (Right) IRRIGATION AND DEBRIDEMENT RIGHT HAND WITH PARTIAL NAIL REMOVAL TO RING FINGER AND THUMB (Right) Will need definitive fixation of her distal femur and proximal tibia.  Will consult with Dr. Carola FrostHandy, Ortho Trauma, for assistance in her care.  Can be on Lovenox from Ortho standpoint.  Non-weight bearing on  right leg and WBAT left leg.  Kathryne HitchChristopher Y Kaiyan Luczak 11/25/2016, 9:28 AM

## 2016-11-26 ENCOUNTER — Encounter (HOSPITAL_COMMUNITY): Payer: Self-pay | Admitting: Orthopedic Surgery

## 2016-11-26 LAB — CBC
HEMATOCRIT: 23.9 % — AB (ref 36.0–46.0)
Hemoglobin: 8 g/dL — ABNORMAL LOW (ref 12.0–15.0)
MCH: 30.5 pg (ref 26.0–34.0)
MCHC: 33.5 g/dL (ref 30.0–36.0)
MCV: 91.2 fL (ref 78.0–100.0)
PLATELETS: 145 10*3/uL — AB (ref 150–400)
RBC: 2.62 MIL/uL — ABNORMAL LOW (ref 3.87–5.11)
RDW: 12.7 % (ref 11.5–15.5)
WBC: 5.8 10*3/uL (ref 4.0–10.5)

## 2016-11-26 LAB — CDS SEROLOGY

## 2016-11-26 MED ORDER — HYDROMORPHONE 1 MG/ML IV SOLN
INTRAVENOUS | Status: DC
  Administered 2016-11-26: 11:00:00 via INTRAVENOUS
  Administered 2016-11-26 (×2): 3.9 mg via INTRAVENOUS
  Administered 2016-11-26: 1.8 mg via INTRAVENOUS
  Administered 2016-11-27: 3.6 mg via INTRAVENOUS
  Administered 2016-11-27: 3.3 mg via INTRAVENOUS
  Filled 2016-11-26: qty 25

## 2016-11-26 MED ORDER — ENOXAPARIN SODIUM 40 MG/0.4ML ~~LOC~~ SOLN
40.0000 mg | SUBCUTANEOUS | Status: AC
  Administered 2016-11-26: 40 mg via SUBCUTANEOUS
  Filled 2016-11-26: qty 0.4

## 2016-11-26 MED ORDER — DIPHENHYDRAMINE HCL 12.5 MG/5ML PO ELIX: 12.5000 mg | ORAL_SOLUTION | Freq: Four times a day (QID) | ORAL | Status: DC | PRN

## 2016-11-26 MED ORDER — TRAMADOL HCL 50 MG PO TABS
100.0000 mg | ORAL_TABLET | Freq: Four times a day (QID) | ORAL | Status: DC | PRN
  Administered 2016-11-26 – 2016-11-28 (×5): 100 mg via ORAL
  Filled 2016-11-26 (×5): qty 2

## 2016-11-26 MED ORDER — IPRATROPIUM-ALBUTEROL 0.5-2.5 (3) MG/3ML IN SOLN: 3.0000 mL | Freq: Four times a day (QID) | RESPIRATORY_TRACT | Status: DC

## 2016-11-26 MED ORDER — SODIUM CHLORIDE 0.9% FLUSH
9.0000 mL | INTRAVENOUS | Status: DC | PRN
  Administered 2016-11-28: 9 mL via INTRAVENOUS
  Filled 2016-11-26: qty 9

## 2016-11-26 MED ORDER — DIPHENHYDRAMINE HCL 50 MG/ML IJ SOLN
12.5000 mg | Freq: Four times a day (QID) | INTRAMUSCULAR | Status: DC | PRN
  Administered 2016-11-28: 12.5 mg via INTRAVENOUS
  Filled 2016-11-26: qty 1

## 2016-11-26 MED ORDER — CEFAZOLIN SODIUM-DEXTROSE 2-4 GM/100ML-% IV SOLN
2.0000 g | Freq: Once | INTRAVENOUS | Status: AC
  Administered 2016-11-27: 2 g via INTRAVENOUS
  Filled 2016-11-26: qty 100

## 2016-11-26 MED ORDER — NALOXONE HCL 0.4 MG/ML IJ SOLN: 0.4000 mg | INTRAMUSCULAR | Status: DC | PRN

## 2016-11-26 MED ORDER — ENSURE ENLIVE PO LIQD
237.0000 mL | Freq: Two times a day (BID) | ORAL | Status: DC
  Administered 2016-11-28 – 2016-12-03 (×9): 237 mL via ORAL
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
## 2016-11-27 ENCOUNTER — Encounter (HOSPITAL_COMMUNITY): Payer: Self-pay | Admitting: Anesthesiology

## 2016-11-27 ENCOUNTER — Inpatient Hospital Stay (HOSPITAL_COMMUNITY): Payer: Medicaid Other

## 2016-11-27 ENCOUNTER — Inpatient Hospital Stay (HOSPITAL_COMMUNITY): Payer: Medicaid Other | Admitting: Anesthesiology

## 2016-11-27 ENCOUNTER — Encounter (HOSPITAL_COMMUNITY): Admission: EM | Disposition: A | Discharge status: Skilled Nursing Facility | Payer: Self-pay | Source: Home / Self Care

## 2016-11-27 HISTORY — PX: EXTERNAL FIXATION REMOVAL: SHX5040

## 2016-11-27 HISTORY — PX: FEMUR IM NAIL: SHX1597

## 2016-11-27 HISTORY — PX: TIBIA IM NAIL INSERTION: SHX2516

## 2016-11-27 LAB — CBC
HEMATOCRIT: 21.7 % — AB (ref 36.0–46.0)
HEMATOCRIT: 25.9 % — AB (ref 36.0–46.0)
Hemoglobin: 7.3 g/dL — ABNORMAL LOW (ref 12.0–15.0)
Hemoglobin: 8.6 g/dL — ABNORMAL LOW (ref 12.0–15.0)
MCH: 30.5 pg (ref 26.0–34.0)
MCH: 30.4 pg (ref 26.0–34.0)
MCHC: 33.6 g/dL (ref 30.0–36.0)
MCHC: 33.2 g/dL (ref 30.0–36.0)
MCV: 90.8 fL (ref 78.0–100.0)
MCV: 91.5 fL (ref 78.0–100.0)
PLATELETS: 130 10*3/uL — AB (ref 150–400)
Platelets: 165 10*3/uL (ref 150–400)
RBC: 2.39 MIL/uL — AB (ref 3.87–5.11)
RBC: 2.83 MIL/uL — ABNORMAL LOW (ref 3.87–5.11)
RDW: 12.8 % (ref 11.5–15.5)
RDW: 13.5 % (ref 11.5–15.5)
WBC: 5 10*3/uL (ref 4.0–10.5)
WBC: 8.5 10*3/uL (ref 4.0–10.5)

## 2016-11-27 LAB — PREPARE RBC (CROSSMATCH)

## 2016-11-27 LAB — BASIC METABOLIC PANEL
ANION GAP: 3 — AB (ref 5–15)
BUN: <5 mg/dL — ABNORMAL LOW (ref 6–20)
CALCIUM: 7.7 mg/dL — AB (ref 8.9–10.3)
CO2: 27 mmol/L (ref 22–32)
Chloride: 108 mmol/L (ref 101–111)
Creatinine, Ser: 0.52 mg/dL (ref 0.44–1.00)
GFR calc Af Amer: >60 mL/min (ref >60)
Glucose, Bld: 92 mg/dL (ref 65–99)
Potassium: 3.6 mmol/L (ref 3.5–5.1)
Sodium: 138 mmol/L (ref 135–145)

## 2016-11-27 LAB — ABO/RH: ABO/RH(D): A POS

## 2016-11-27 LAB — PROTIME-INR
INR: 1.01
PROTHROMBIN TIME: 13.3 s (ref 11.4–15.2)

## 2016-11-27 LAB — APTT: aPTT: 40 seconds — ABNORMAL HIGH (ref 24–36)

## 2016-11-27 SURGERY — INSERTION, INTRAMEDULLARY ROD, FEMUR
Anesthesia: General | Laterality: Right

## 2016-11-27 MED ORDER — ROCURONIUM BROMIDE 100 MG/10ML IV SOLN
INTRAVENOUS | Status: DC | PRN
  Administered 2016-11-27: 50 mg via INTRAVENOUS

## 2016-11-27 MED ORDER — SODIUM CHLORIDE 0.9 % IV SOLN: Freq: Once | INTRAVENOUS | Status: DC

## 2016-11-27 MED ORDER — HYDROMORPHONE HCL 2 MG/ML IJ SOLN
INTRAMUSCULAR | Status: AC
  Filled 2016-11-27: qty 1

## 2016-11-27 MED ORDER — BUPIVACAINE-EPINEPHRINE (PF) 0.5% -1:200000 IJ SOLN
INTRAMUSCULAR | Status: DC | PRN
  Administered 2016-11-27: 20 mL via PERINEURAL

## 2016-11-27 MED ORDER — HYDROMORPHONE HCL 1 MG/ML IJ SOLN
0.2500 mg | INTRAMUSCULAR | Status: DC | PRN
  Administered 2016-11-27 (×4): 0.5 mg via INTRAVENOUS

## 2016-11-27 MED ORDER — CEFAZOLIN SODIUM-DEXTROSE 2-4 GM/100ML-% IV SOLN
2.0000 g | Freq: Three times a day (TID) | INTRAVENOUS | Status: AC
  Administered 2016-11-27 – 2016-11-28 (×3): 2 g via INTRAVENOUS
  Filled 2016-11-27 (×3): qty 100

## 2016-11-27 MED ORDER — OXYCODONE HCL 5 MG PO TABS
10.0000 mg | ORAL_TABLET | ORAL | Status: DC | PRN
  Administered 2016-11-27 – 2016-11-29 (×10): 20 mg via ORAL
  Filled 2016-11-27 (×10): qty 4

## 2016-11-27 MED ORDER — KETOROLAC TROMETHAMINE 0.5 % OP SOLN
1.0000 [drp] | Freq: Three times a day (TID) | OPHTHALMIC | Status: DC | PRN
  Administered 2016-11-27: 1 [drp] via OPHTHALMIC
  Filled 2016-11-27: qty 3

## 2016-11-27 MED ORDER — KETAMINE HCL-SODIUM CHLORIDE 100-0.9 MG/10ML-% IV SOSY
PREFILLED_SYRINGE | INTRAVENOUS | Status: AC
  Filled 2016-11-27: qty 10

## 2016-11-27 MED ORDER — METHOCARBAMOL 500 MG PO TABS
ORAL_TABLET | ORAL | Status: AC
  Filled 2016-11-27: qty 2

## 2016-11-27 MED ORDER — POLYMYXIN B-TRIMETHOPRIM 10000-0.1 UNIT/ML-% OP SOLN
1.0000 [drp] | Freq: Three times a day (TID) | OPHTHALMIC | Status: DC
  Administered 2016-11-27: 1 [drp] via OPHTHALMIC
  Filled 2016-11-27: qty 10

## 2016-11-27 MED ORDER — FENTANYL CITRATE (PF) 100 MCG/2ML IJ SOLN
INTRAMUSCULAR | Status: AC
  Filled 2016-11-27: qty 2

## 2016-11-27 MED ORDER — FENTANYL CITRATE (PF) 100 MCG/2ML IJ SOLN
INTRAMUSCULAR | Status: DC | PRN
  Administered 2016-11-27 (×5): 50 ug via INTRAVENOUS

## 2016-11-27 MED ORDER — PROPOFOL 10 MG/ML IV BOLUS
INTRAVENOUS | Status: AC
  Filled 2016-11-27: qty 20

## 2016-11-27 MED ORDER — HYDROMORPHONE 1 MG/ML IV SOLN
INTRAVENOUS | Status: AC
  Administered 2016-11-27: 18:00:00
  Filled 2016-11-27: qty 25

## 2016-11-27 MED ORDER — MIDAZOLAM HCL 2 MG/2ML IJ SOLN
INTRAMUSCULAR | Status: AC
  Filled 2016-11-27: qty 2

## 2016-11-27 MED ORDER — LACTATED RINGERS IV SOLN
INTRAVENOUS | Status: DC | PRN
  Administered 2016-11-27 (×2): via INTRAVENOUS

## 2016-11-27 MED ORDER — BSS IO SOLN
15.0000 mL | Freq: Once | INTRAOCULAR | Status: DC
  Filled 2016-11-27: qty 15

## 2016-11-27 MED ORDER — ACETAMINOPHEN 325 MG PO TABS
650.0000 mg | ORAL_TABLET | Freq: Once | ORAL | Status: DC
  Filled 2016-11-27: qty 2

## 2016-11-27 MED ORDER — METHOCARBAMOL 500 MG PO TABS
1000.0000 mg | ORAL_TABLET | Freq: Four times a day (QID) | ORAL | Status: DC | PRN
  Administered 2016-11-27 – 2016-11-28 (×3): 1000 mg via ORAL
  Filled 2016-11-27 (×3): qty 2

## 2016-11-27 MED ORDER — LEVOTHYROXINE SODIUM 100 MCG IV SOLR
68.5000 ug | Freq: Every day | INTRAVENOUS | Status: DC
  Administered 2016-11-27 – 2016-11-28 (×2): 68.5 ug via INTRAVENOUS
  Filled 2016-11-27 (×3): qty 5

## 2016-11-27 MED ORDER — HYDROMORPHONE 1 MG/ML IV SOLN
INTRAVENOUS | Status: DC
  Administered 2016-11-28: 3.6 mg via INTRAVENOUS
  Administered 2016-11-28: 18:00:00 via INTRAVENOUS
  Administered 2016-11-28 – 2016-11-29 (×2): 3.6 mg via INTRAVENOUS
  Administered 2016-11-29: 6.9 mg via INTRAVENOUS
  Administered 2016-11-29: 1.8 mg via INTRAVENOUS
  Administered 2016-11-29: 3.6 mg via INTRAVENOUS
  Filled 2016-11-27: qty 25

## 2016-11-27 MED ORDER — PROPOFOL 10 MG/ML IV BOLUS
INTRAVENOUS | Status: DC | PRN
  Administered 2016-11-27: 150 mg via INTRAVENOUS

## 2016-11-27 MED ORDER — KETAMINE HCL 10 MG/ML IJ SOLN
INTRAMUSCULAR | Status: DC | PRN
  Administered 2016-11-27 (×3): 20 mg via INTRAVENOUS

## 2016-11-27 MED ORDER — MIDAZOLAM HCL 2 MG/2ML IJ SOLN
INTRAMUSCULAR | Status: AC
  Administered 2016-11-27: 2 mg
  Filled 2016-11-27: qty 2

## 2016-11-27 MED ORDER — FENTANYL CITRATE (PF) 100 MCG/2ML IJ SOLN
INTRAMUSCULAR | Status: AC
  Filled 2016-11-27: qty 4

## 2016-11-27 MED ORDER — 0.9 % SODIUM CHLORIDE (POUR BTL) OPTIME
TOPICAL | Status: DC | PRN
  Administered 2016-11-27: 1000 mL

## 2016-11-27 MED ORDER — LIDOCAINE HCL (CARDIAC) 20 MG/ML IV SOLN
INTRAVENOUS | Status: DC | PRN
  Administered 2016-11-27: 100 mg via INTRAVENOUS

## 2016-11-27 MED ORDER — SUGAMMADEX SODIUM 200 MG/2ML IV SOLN
INTRAVENOUS | Status: DC | PRN
  Administered 2016-11-27: 113.4 mg via INTRAVENOUS

## 2016-11-27 SURGICAL SUPPLY — 85 items
BANDAGE ACE 4X5 VEL STRL LF (GAUZE/BANDAGES/DRESSINGS) ×1 IMPLANT
BANDAGE ACE 6X5 VEL STRL LF (GAUZE/BANDAGES/DRESSINGS) ×1 IMPLANT
BIT DRILL CALIBRATED 4.3MMX365 (DRILL) IMPLANT
BIT DRILL CALIBRATED 4.3X320MM (BIT) IMPLANT
BIT DRILL CROWE PNT TWST 4.5MM (DRILL) IMPLANT
BIT DRILL CROWE POINT TWST 4.3 (DRILL) IMPLANT
BLADE SURG 10 STRL SS (BLADE) ×2 IMPLANT
BNDG COHESIVE 6X5 TAN STRL LF (GAUZE/BANDAGES/DRESSINGS) IMPLANT
BNDG GAUZE ELAST 4 BULKY (GAUZE/BANDAGES/DRESSINGS) ×2 IMPLANT
BRUSH SCRUB DISP (MISCELLANEOUS) ×4 IMPLANT
COVER PERINEAL POST (MISCELLANEOUS) ×1 IMPLANT
COVER SURGICAL LIGHT HANDLE (MISCELLANEOUS) ×4 IMPLANT
DRAPE C-ARM 42X72 X-RAY (DRAPES) ×2 IMPLANT
DRAPE C-ARMOR (DRAPES) ×2 IMPLANT
DRAPE HALF SHEET 40X57 (DRAPES) ×4 IMPLANT
DRAPE INCISE IOBAN 66X45 STRL (DRAPES) IMPLANT
DRAPE ORTHO SPLIT 77X108 STRL (DRAPES) ×4
DRAPE PROXIMA HALF (DRAPES) IMPLANT
DRAPE SURG ORHT 6 SPLT 77X108 (DRAPES) ×2 IMPLANT
DRAPE U-SHAPE 47X51 STRL (DRAPES) ×2 IMPLANT
DRILL CALIBRATED 4.3MMX365 (DRILL) ×2
DRILL CALIBRATED 4.3X320MM (BIT) ×2
DRILL CROWE POINT TWIST 4.3 (DRILL) ×2
DRILL CROWE POINT TWIST 4.5MM (DRILL) ×2
DRSG ADAPTIC 3X8 NADH LF (GAUZE/BANDAGES/DRESSINGS) ×1 IMPLANT
DRSG MEPILEX BORDER 4X4 (GAUZE/BANDAGES/DRESSINGS) ×2 IMPLANT
DRSG MEPILEX BORDER 4X8 (GAUZE/BANDAGES/DRESSINGS) ×2 IMPLANT
ELECT REM PT RETURN 9FT ADLT (ELECTROSURGICAL) ×2
ELECTRODE REM PT RTRN 9FT ADLT (ELECTROSURGICAL) ×1 IMPLANT
GAUZE SPONGE 4X4 12PLY STRL (GAUZE/BANDAGES/DRESSINGS) ×1 IMPLANT
GLOVE BIO SURGEON STRL SZ7.5 (GLOVE) ×2 IMPLANT
GLOVE BIO SURGEON STRL SZ8 (GLOVE) ×2 IMPLANT
GLOVE BIO SURGEON STRL SZ8.5 (GLOVE) ×2 IMPLANT
GLOVE BIOGEL PI IND STRL 7.5 (GLOVE) ×1 IMPLANT
GLOVE BIOGEL PI IND STRL 8 (GLOVE) ×1 IMPLANT
GLOVE BIOGEL PI INDICATOR 7.5 (GLOVE) ×1
GLOVE BIOGEL PI INDICATOR 8 (GLOVE) ×1
GOWN STRL REUS W/ TWL LRG LVL3 (GOWN DISPOSABLE) ×2 IMPLANT
GOWN STRL REUS W/ TWL XL LVL3 (GOWN DISPOSABLE) ×1 IMPLANT
GOWN STRL REUS W/TWL LRG LVL3 (GOWN DISPOSABLE) ×4
GOWN STRL REUS W/TWL XL LVL3 (GOWN DISPOSABLE) ×2
GUIDEPIN 3.2X17.5 THRD DISP (PIN) ×1 IMPLANT
GUIDEWIRE 2.6X80 BEAD TIP (WIRE) IMPLANT
GUIDEWIRE BEAD TIP (WIRE) ×1 IMPLANT
GUIDWIRE 2.6X80 BEAD TIP (WIRE) ×2
KIT BASIN OR (CUSTOM PROCEDURE TRAY) ×2 IMPLANT
KIT ROOM TURNOVER OR (KITS) ×2 IMPLANT
MANIFOLD NEPTUNE II (INSTRUMENTS) ×2 IMPLANT
NAIL FEM RETRO 9X340 (Nail) ×1 IMPLANT
NAIL TIBIAL PHOENIX 9.0X300MM (Nail) ×1 IMPLANT
NS IRRIG 1000ML POUR BTL (IV SOLUTION) ×2 IMPLANT
PACK GENERAL/GYN (CUSTOM PROCEDURE TRAY) ×2 IMPLANT
PACK ORTHO EXTREMITY (CUSTOM PROCEDURE TRAY) ×2 IMPLANT
PAD ARMBOARD 7.5X6 YLW CONV (MISCELLANEOUS) ×4 IMPLANT
PADDING CAST COTTON 6X4 STRL (CAST SUPPLIES) ×3 IMPLANT
PIN GUIDE DRILL TIP 2.8X300 (DRILL) ×2 IMPLANT
SCREW CANN 70X40X6.5 (Screw) IMPLANT
SCREW CANNULATED 6.5X65 KNEE (Screw) ×1 IMPLANT
SCREW CANNULATED 6.5X70MM (Screw) ×2 IMPLANT
SCREW CORT TI DBL LEAD 5X30 (Screw) ×1 IMPLANT
SCREW CORT TI DBL LEAD 5X36 (Screw) ×1 IMPLANT
SCREW CORT TI DBL LEAD 5X44 (Screw) ×1 IMPLANT
SCREW CORT TI DBL LEAD 5X56 (Screw) ×1 IMPLANT
SCREW CORT TI DBL LEAD 5X60 (Screw) ×1 IMPLANT
SCREW CORT TI DBL LEAD 5X65 (Screw) ×1 IMPLANT
SCREW CORT TI DBL LEAD 5X70 (Screw) ×1 IMPLANT
SCREW CORT TI DBLE LEAD 5X28 (Screw) ×2 IMPLANT
SCREW CORT TI DBLE LEAD 5X54 (Screw) ×2 IMPLANT
SPONGE LAP 18X18 X RAY DECT (DISPOSABLE) ×2 IMPLANT
SPONGE SCRUB IODOPHOR (GAUZE/BANDAGES/DRESSINGS) ×2 IMPLANT
STAPLER VISISTAT 35W (STAPLE) ×2 IMPLANT
STOCKINETTE IMPERVIOUS LG (DRAPES) IMPLANT
STRIP CLOSURE SKIN 1/2X4 (GAUZE/BANDAGES/DRESSINGS) ×1 IMPLANT
SUT ETHILON 3 0 PS 1 (SUTURE) ×2 IMPLANT
SUT VIC AB 0 CT1 27 (SUTURE) ×2
SUT VIC AB 0 CT1 27XBRD ANBCTR (SUTURE) ×1 IMPLANT
SUT VIC AB 1 CT1 27 (SUTURE) ×2
SUT VIC AB 1 CT1 27XBRD ANBCTR (SUTURE) ×1 IMPLANT
SUT VIC AB 2-0 CT1 27 (SUTURE) ×2
SUT VIC AB 2-0 CT1 TAPERPNT 27 (SUTURE) ×1 IMPLANT
TOWEL OR 17X24 6PK STRL BLUE (TOWEL DISPOSABLE) ×4 IMPLANT
TOWEL OR 17X26 10 PK STRL BLUE (TOWEL DISPOSABLE) ×4 IMPLANT
UNDERPAD 30X30 (UNDERPADS AND DIAPERS) ×2 IMPLANT
WATER STERILE IRR 1000ML POUR (IV SOLUTION) ×4 IMPLANT
YANKAUER SUCT BULB TIP NO VENT (SUCTIONS) IMPLANT

## 2016-11-27 NOTE — Anesthesia Postprocedure Evaluation (Signed)
Anesthesia Post Note  Patient: [name]  Procedure(s) Performed: Procedure(s) (LRB): INTRAMEDULLARY (IM) NAIL FEMORAL (Right) INTRAMEDULLARY (IM) NAIL TIBIAL (Right) REMOVAL EXTERNAL FIXATION LEG (Right)  Patient location during evaluation: PACU Anesthesia Type: General Level of consciousness: awake, awake and alert and oriented Pain management: pain level controlled Vital Signs Assessment: post-procedure vital signs reviewed and stable Respiratory status: spontaneous breathing, nonlabored ventilation and respiratory function stable Cardiovascular status: blood pressure returned to baseline Anesthetic complications: no       Last Vitals:  Vitals:   11/27/16 1800 11/27/16 1813  BP:    Pulse: 84   Resp: 14 15  Temp: 36.4 C     Last Pain:  Vitals:   11/27/16 1813  TempSrc:   PainSc: 5                  Anna-Marie Coller COKER
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
## 2016-11-27 NOTE — Progress Notes (Signed)
PT Cancellation Note  Patient Details Name: [name] MRN: [id number] DOB: 05-16-1971   Cancelled Treatment:    Reason Eval/Treat Not Completed: Pain limiting ability to participate Pt refused therapy today due to pain and leaving for surgery soon. Educated pt on the importance of sitting up for healing and lung health. Pt confirms understand but continues to decline. Pt agrees to perform bed level exercises.    Gaye PollackRebecca Bueford Arp 11/27/2016, 9:40 AM  Gaye Pollackebecca Khilynn Borntreger, SPT [phone]

## 2016-11-27 NOTE — Progress Notes (Signed)
Orthopedic Tech Progress Note Patient Details:  [name] 11-Jan-1971 [id number]  Ortho Devices Ortho Device/Splint Location: Provided Bone Foam Zero Degree for Right Leg.  Placed Device at Bedside. Nurse Corrie DandyMary at bedside.   [name], [name] 11/27/2016, 7:07 PM

## 2016-11-27 NOTE — Progress Notes (Signed)
Pt c/o pain in left eye, says she can't see out of left eye, but when I cover the right eye  She can tell me how many fingers I am holding up. Eyelid is sl swollen and sclera reddened. Dr Raynelle DickWm Fitzgerald here-new orders obtained.

## 2016-11-27 NOTE — Anesthesia Preprocedure Evaluation (Addendum)
Anesthesia Evaluation  Patient identified by MRN, date of birth, ID band Patient awake    Reviewed: Allergy & Precautions, NPO status , Patient's Chart, lab work & pertinent test results  Airway Mallampati: II  TM Distance: >3 FB Neck ROM: Limited  Mouth opening: Limited Mouth Opening Comment: c collar on.  Discussed with pt poss. Of need for post op ventilation Dental  (+) Edentulous Upper, Edentulous Lower   Pulmonary asthma , pneumonia, COPD, Current Smoker,    + rhonchi        Cardiovascular  Rhythm:Regular Rate:Normal     Neuro/Psych PSYCHIATRIC DISORDERS Anxiety Depression Bipolar Disorder  C-spine not cleared    GI/Hepatic PUD, GERD  ,  Endo/Other  Hypothyroidism Hyperthyroidism   Renal/GU      Musculoskeletal   Abdominal   Peds  Hematology   Anesthesia Other Findings   Reproductive/Obstetrics                            Anesthesia Physical  Anesthesia Plan  ASA: III  Anesthesia Plan: General   Post-op Pain Management:    Induction: Intravenous  Airway Management Planned: Oral ETT and Video Laryngoscope Planned  Additional Equipment:   Intra-op Plan:   Post-operative Plan: Extubation in OR  Informed Consent: I have reviewed the patients History and Physical, chart, labs and discussed the procedure including the risks, benefits and alternatives for the proposed anesthesia with the patient or authorized representative who has indicated his/her understanding and acceptance.   Dental advisory given  Plan Discussed with: CRNA  Anesthesia Plan Comments:        Anesthesia Quick Evaluation

## 2016-11-27 NOTE — Transfer of Care (Signed)
Immediate Anesthesia Transfer of Care Note  Patient: [name]  Procedure(s) Performed: Procedure(s): INTRAMEDULLARY (IM) NAIL FEMORAL (Right) INTRAMEDULLARY (IM) NAIL TIBIAL (Right) REMOVAL EXTERNAL FIXATION LEG (Right)  Patient Location: PACU  Anesthesia Type:General and GA combined with regional for post-op pain  Level of Consciousness: sedated and patient cooperative  Airway & Oxygen Therapy: Patient Spontanous Breathing and Patient connected to nasal cannula oxygen  Post-op Assessment: Report given to RN and Post -op Vital signs reviewed and stable  Post vital signs: Reviewed and stable  Last Vitals:  Vitals:   11/27/16 1047 11/27/16 1115  BP:  101/61  Pulse: 64 73  Resp:  18  Temp:      Last Pain:  Vitals:   11/27/16 1000  TempSrc:   PainSc: 9       Patients Stated Pain Goal: 9 (11/27/16 1000)  Complications: No apparent anesthesia complications

## 2016-11-27 NOTE — Anesthesia Procedure Notes (Signed)
Procedure Name: Intubation Date/Time: 11/27/2016 11:53 AM Performed by: Gwenyth AllegraADAMI, Turquoise Esch Pre-anesthesia Checklist: Emergency Drugs available, Patient identified, Suction available, Patient being monitored and Timeout performed Patient Re-evaluated:Patient Re-evaluated prior to inductionOxygen Delivery Method: Circle system utilized Preoxygenation: Pre-oxygenation with 100% oxygen Ventilation: Mask ventilation without difficulty Laryngoscope Size: Glidescope and 3 Grade View: Grade I Tube type: Oral Number of attempts: 1 Airway Equipment and Method: Video-laryngoscopy and Stylet Placement Confirmation: ETT inserted through vocal cords under direct vision,  positive ETCO2 and breath sounds checked- equal and bilateral Secured at: 21 cm Tube secured with: Tape Dental Injury: Teeth and Oropharynx as per pre-operative assessment

## 2016-11-27 NOTE — Anesthesia Procedure Notes (Signed)
Anesthesia Regional Block:  Femoral nerve block  Pre-Anesthetic Checklist: ,, timeout performed, Correct Patient, Correct Site, Correct Laterality, Correct Procedure, Correct Position, site marked, Risks and benefits discussed,  Surgical consent,  Pre-op evaluation,  At surgeon's request and post-op pain management  Laterality: Right  Prep: chloraprep       Needles:  Injection technique: Single-shot  Needle Type: Stimulator Needle - 80     Needle Length: 9cm 9 cm Needle Gauge: 22 and 22 G  Needle insertion depth: 6 cm   Additional Needles:  Procedures: ultrasound guided (picture in chart) Femoral nerve block Narrative:  Start time: 11/27/2016 11:10 AM End time: 11/27/2016 11:20 AM Injection made incrementally with aspirations every 5 mL.  Performed by: Personally  Anesthesiologist: Lewie LoronGERMEROTH, Jerrilyn Messinger  Additional Notes: BP cuff, EKG monitors applied. Sedation begun. Femoral artery palpated for location of nerve. After nerve location verified with U/S, anesthetic injected incrementally, slowly, and after negative aspirations under direct u/s guidance. Good perineural spread. Patient tolerated well.
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
## 2016-11-27 NOTE — Brief Op Note (Signed)
11/23/2016 - 11/27/2016  2:58 PM  PATIENT:  [name]  46 y.o. female  PRE-OPERATIVE DIAGNOSIS:   1. RIGHT SUPRACONDYLAR FEMUR FRACTURE 2. RIGHT BICONDYLAR PLATEAU FRACTURE 3. RIGHT TIBIAL SHAFT FRACTURE, COMMINUTED 4. SPANNING EXTERNAL FIXATION  POST-OPERATIVE DIAGNOSIS:   1. RIGHT SUPRACONDYLAR FEMUR FRACTURE 2. RIGHT BICONDYLAR PLATEAU FRACTURE 3. RIGHT TIBIAL SHAFT FRACTURE, COMMINUTED 4. SPANNING EXTERNAL FIXATION  PROCEDURES:  Procedure(s): 1. INTRAMEDULLARY (IM) NAIL FEMORAL (Right) WITH BIOMET PHOENIX RETROGRADE 9 X 340MM 2. INTRAMEDULLARY (IM) NAIL TIBIAL SHAFT (Right) WITH BIOMET PHOENIX 9 X 300MM 3. OPEN REDUCTION INTERNAL FIXATION RIGHT BICONDYLAR PLATEAU 4. REMOVAL OF EXTERNAL FIXATOR UNDER ANESTHESIA 5. CURETTAGE OF PIN SITES TIBIA AND FEMUR  SURGEON:  Surgeon(s) and Role:    * Myrene GalasMichael Niva Murren, MD - Primary  PHYSICIAN ASSISTANT: Montez MoritaKEITH PAUL, PAC  ANESTHESIA:   general  EBL:  Total I/O In: 1250 [I.V.:1250] Out: 650 [Urine:475; Blood:175]  BLOOD ADMINISTERED:none  DRAINS: none   LOCAL MEDICATIONS USED:  NONE  SPECIMEN:  No Specimen  DISPOSITION OF SPECIMEN:  N/A  COUNTS:  YES  TOURNIQUET:  * No tourniquets in log *  DICTATION: .Other Dictation: Dictation Number [id number]  PLAN OF CARE: Admit to inpatient   PATIENT DISPOSITION:  PACU - hemodynamically stable.   Delay start of Pharmacological VTE agent (>24hrs) due to surgical blood loss or risk of bleeding: no

## 2016-11-27 NOTE — Progress Notes (Signed)
Called ortho tech for hinged knee brace and bone foam.

## 2016-11-28 ENCOUNTER — Encounter (HOSPITAL_COMMUNITY): Payer: Self-pay

## 2016-11-28 LAB — CBC
HEMATOCRIT: 24.3 % — AB (ref 36.0–46.0)
HEMOGLOBIN: 8.2 g/dL — AB (ref 12.0–15.0)
MCH: 30.5 pg (ref 26.0–34.0)
MCHC: 33.7 g/dL (ref 30.0–36.0)
MCV: 90.3 fL (ref 78.0–100.0)
Platelets: 197 10*3/uL (ref 150–400)
RBC: 2.69 MIL/uL — AB (ref 3.87–5.11)
RDW: 13.4 % (ref 11.5–15.5)
WBC: 6.2 10*3/uL (ref 4.0–10.5)

## 2016-11-28 LAB — BASIC METABOLIC PANEL
ANION GAP: 7 (ref 5–15)
BUN: <5 mg/dL — ABNORMAL LOW (ref 6–20)
CHLORIDE: 102 mmol/L (ref 101–111)
CO2: 27 mmol/L (ref 22–32)
Calcium: 7.7 mg/dL — ABNORMAL LOW (ref 8.9–10.3)
Creatinine, Ser: 0.51 mg/dL (ref 0.44–1.00)
GFR calc non Af Amer: >60 mL/min (ref >60)
Glucose, Bld: 111 mg/dL — ABNORMAL HIGH (ref 65–99)
POTASSIUM: 3.7 mmol/L (ref 3.5–5.1)
SODIUM: 136 mmol/L (ref 135–145)

## 2016-11-28 LAB — CALCITRIOL (1,25 DI-OH VIT D): VIT D 1 25 DIHYDROXY: 57.1 pg/mL (ref 19.9–79.3)

## 2016-11-28 LAB — VITAMIN D 25 HYDROXY (VIT D DEFICIENCY, FRACTURES): Vit D, 25-Hydroxy: 21 ng/mL — ABNORMAL LOW (ref 30.0–100.0)

## 2016-11-28 MED ORDER — ENOXAPARIN SODIUM 40 MG/0.4ML ~~LOC~~ SOLN
40.0000 mg | SUBCUTANEOUS | Status: DC
  Administered 2016-11-28 – 2016-11-30 (×3): 40 mg via SUBCUTANEOUS
  Filled 2016-11-28 (×4): qty 0.4

## 2016-11-28 MED ORDER — PANTOPRAZOLE SODIUM 40 MG PO TBEC
40.0000 mg | DELAYED_RELEASE_TABLET | Freq: Two times a day (BID) | ORAL | Status: DC
  Administered 2016-11-28 – 2016-12-06 (×16): 40 mg via ORAL
  Filled 2016-11-28 (×16): qty 1

## 2016-11-28 MED ORDER — WARFARIN - PHARMACIST DOSING INPATIENT
Freq: Every day | Status: DC
  Administered 2016-11-29 – 2016-12-06 (×2)

## 2016-11-28 MED ORDER — SERTRALINE HCL 50 MG PO TABS
50.0000 mg | ORAL_TABLET | Freq: Every day | ORAL | Status: DC
  Administered 2016-11-28 – 2016-12-06 (×9): 50 mg via ORAL
  Filled 2016-11-28 (×9): qty 1

## 2016-11-28 MED ORDER — ALBUTEROL SULFATE (2.5 MG/3ML) 0.083% IN NEBU
2.5000 mg | INHALATION_SOLUTION | Freq: Four times a day (QID) | RESPIRATORY_TRACT | Status: DC | PRN
  Filled 2016-11-28: qty 3

## 2016-11-28 MED ORDER — ACETAMINOPHEN 500 MG PO TABS
1000.0000 mg | ORAL_TABLET | Freq: Four times a day (QID) | ORAL | Status: DC
  Administered 2016-11-28 – 2016-12-06 (×32): 1000 mg via ORAL
  Filled 2016-11-28 (×35): qty 2

## 2016-11-28 MED ORDER — TRAMADOL HCL 50 MG PO TABS
100.0000 mg | ORAL_TABLET | Freq: Three times a day (TID) | ORAL | Status: DC
  Administered 2016-11-28 – 2016-12-03 (×15): 100 mg via ORAL
  Filled 2016-11-28 (×15): qty 2

## 2016-11-28 MED ORDER — LEVOTHYROXINE SODIUM 25 MCG PO TABS: 137.0000 ug | ORAL_TABLET | Freq: Every day | ORAL | Status: DC

## 2016-11-28 MED ORDER — VITAMIN D 1000 UNITS PO TABS
4000.0000 [IU] | ORAL_TABLET | Freq: Every day | ORAL | Status: DC
  Administered 2016-11-28 – 2016-12-06 (×9): 4000 [IU] via ORAL
  Filled 2016-11-28 (×9): qty 4

## 2016-11-28 MED ORDER — VITAMIN C 500 MG PO TABS: 500.0000 mg | ORAL_TABLET | Freq: Every day | ORAL | Status: DC

## 2016-11-28 MED ORDER — VITAMIN C 500 MG PO TABS
1000.0000 mg | ORAL_TABLET | Freq: Every day | ORAL | Status: DC
  Administered 2016-11-28 – 2016-12-06 (×9): 1000 mg via ORAL
  Filled 2016-11-28 (×9): qty 2

## 2016-11-28 MED ORDER — CLONAZEPAM 1 MG PO TABS
1.0000 mg | ORAL_TABLET | Freq: Three times a day (TID) | ORAL | Status: DC
  Administered 2016-11-28 – 2016-12-06 (×25): 1 mg via ORAL
  Filled 2016-11-28 (×25): qty 1

## 2016-11-28 MED ORDER — WARFARIN SODIUM 7.5 MG PO TABS
7.5000 mg | ORAL_TABLET | Freq: Once | ORAL | Status: AC
  Administered 2016-11-28: 7.5 mg via ORAL
  Filled 2016-11-28: qty 1

## 2016-11-28 MED ORDER — LEVOTHYROXINE SODIUM 25 MCG PO TABS
137.0000 ug | ORAL_TABLET | Freq: Every day | ORAL | Status: DC
  Administered 2016-11-29 – 2016-12-06 (×8): 137 ug via ORAL
  Filled 2016-11-28 (×8): qty 1

## 2016-11-28 MED ORDER — KETOROLAC TROMETHAMINE 30 MG/ML IJ SOLN
30.0000 mg | Freq: Four times a day (QID) | INTRAMUSCULAR | Status: AC
  Administered 2016-11-28 – 2016-11-30 (×8): 30 mg via INTRAVENOUS
  Filled 2016-11-28 (×8): qty 1

## 2016-11-28 MED ORDER — SUCRALFATE 1 GM/10ML PO SUSP
1.0000 g | Freq: Three times a day (TID) | ORAL | Status: DC
  Administered 2016-11-28 – 2016-12-06 (×33): 1 g via ORAL
  Filled 2016-11-28 (×32): qty 10

## 2016-11-28 MED ORDER — METHOCARBAMOL 500 MG PO TABS
1000.0000 mg | ORAL_TABLET | Freq: Four times a day (QID) | ORAL | Status: DC
  Administered 2016-11-28 – 2016-12-06 (×35): 1000 mg via ORAL
  Filled 2016-11-28 (×35): qty 2

## 2016-11-28 MED ORDER — GABAPENTIN 300 MG PO CAPS
300.0000 mg | ORAL_CAPSULE | Freq: Two times a day (BID) | ORAL | Status: DC
  Administered 2016-11-28 – 2016-12-06 (×17): 300 mg via ORAL
  Filled 2016-11-28 (×2): qty 1
  Filled 2016-11-28: qty 3
  Filled 2016-11-28 (×16): qty 1
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
## 2016-11-29 ENCOUNTER — Encounter (HOSPITAL_COMMUNITY): Payer: Self-pay | Admitting: Orthopedic Surgery

## 2016-11-29 LAB — CBC
HCT: 21.1 % — ABNORMAL LOW (ref 36.0–46.0)
Hemoglobin: 7 g/dL — ABNORMAL LOW (ref 12.0–15.0)
MCH: 30.4 pg (ref 26.0–34.0)
MCHC: 33.2 g/dL (ref 30.0–36.0)
MCV: 91.7 fL (ref 78.0–100.0)
PLATELETS: 187 10*3/uL (ref 150–400)
RBC: 2.3 MIL/uL — ABNORMAL LOW (ref 3.87–5.11)
RDW: 13.6 % (ref 11.5–15.5)
WBC: 3.8 10*3/uL — AB (ref 4.0–10.5)

## 2016-11-29 LAB — BASIC METABOLIC PANEL
ANION GAP: 4 — AB (ref 5–15)
BUN: 5 mg/dL — ABNORMAL LOW (ref 6–20)
CO2: 27 mmol/L (ref 22–32)
CREATININE: 0.53 mg/dL (ref 0.44–1.00)
Calcium: 7.6 mg/dL — ABNORMAL LOW (ref 8.9–10.3)
Chloride: 108 mmol/L (ref 101–111)
GFR calc Af Amer: >60 mL/min (ref >60)
GFR calc non Af Amer: >60 mL/min (ref >60)
Glucose, Bld: 83 mg/dL (ref 65–99)
Potassium: 3.3 mmol/L — ABNORMAL LOW (ref 3.5–5.1)
SODIUM: 139 mmol/L (ref 135–145)

## 2016-11-29 LAB — PROTIME-INR
INR: 1.69
Prothrombin Time: 20 s — ABNORMAL HIGH (ref 11.4–15.2)

## 2016-11-29 MED ORDER — WARFARIN SODIUM 7.5 MG PO TABS
7.5000 mg | ORAL_TABLET | Freq: Once | ORAL | Status: AC
  Administered 2016-11-29: 7.5 mg via ORAL
  Filled 2016-11-29: qty 1

## 2016-11-29 MED ORDER — BISACODYL 10 MG RE SUPP
10.0000 mg | Freq: Every day | RECTAL | Status: DC | PRN
  Filled 2016-11-29: qty 1

## 2016-11-29 MED ORDER — FENTANYL CITRATE (PF) 100 MCG/2ML IJ SOLN
25.0000 ug | INTRAMUSCULAR | Status: DC | PRN
  Administered 2016-11-29 – 2016-11-30 (×7): 25 ug via INTRAVENOUS
  Filled 2016-11-29 (×7): qty 2

## 2016-11-29 MED ORDER — HYDROMORPHONE HCL 2 MG PO TABS
2.0000 mg | ORAL_TABLET | ORAL | Status: DC | PRN
Start: 2016-11-29 — End: 2016-12-03
  Administered 2016-11-29 – 2016-12-02 (×15): 4 mg via ORAL
  Administered 2016-12-02: 2 mg via ORAL
  Administered 2016-12-02 – 2016-12-03 (×5): 4 mg via ORAL
  Filled 2016-11-29 (×21): qty 2

## 2016-11-29 MED ORDER — POLYETHYLENE GLYCOL 3350 17 G PO PACK
17.0000 g | PACK | Freq: Every day | ORAL | Status: DC
  Administered 2016-11-30 – 2016-12-02 (×3): 17 g via ORAL
  Filled 2016-11-29 (×4): qty 1

## 2016-11-29 MED ORDER — DOCUSATE SODIUM 100 MG PO CAPS
200.0000 mg | ORAL_CAPSULE | Freq: Two times a day (BID) | ORAL | Status: DC
  Administered 2016-11-30 – 2016-12-02 (×4): 200 mg via ORAL
  Filled 2016-11-29 (×5): qty 2
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
## 2016-11-30 LAB — CBC
HCT: 21.3 % — ABNORMAL LOW (ref 36.0–46.0)
HEMOGLOBIN: 7 g/dL — AB (ref 12.0–15.0)
MCH: 30 pg (ref 26.0–34.0)
MCHC: 32.9 g/dL (ref 30.0–36.0)
MCV: 91.4 fL (ref 78.0–100.0)
Platelets: 232 10*3/uL (ref 150–400)
RBC: 2.33 MIL/uL — AB (ref 3.87–5.11)
RDW: 14 % (ref 11.5–15.5)
WBC: 4.4 10*3/uL (ref 4.0–10.5)

## 2016-11-30 LAB — PROTIME-INR
INR: 3.97
Prothrombin Time: 39.8 s — ABNORMAL HIGH (ref 11.4–15.2)

## 2016-11-30 MED ORDER — FENTANYL CITRATE (PF) 100 MCG/2ML IJ SOLN
25.0000 ug | INTRAMUSCULAR | Status: DC | PRN
  Administered 2016-11-30 – 2016-12-03 (×30): 50 ug via INTRAVENOUS
  Filled 2016-11-30 (×30): qty 2

## 2016-11-30 MED ORDER — TAB-A-VITE/IRON PO TABS
1.0000 | ORAL_TABLET | Freq: Every day | ORAL | Status: DC
  Administered 2016-11-30 – 2016-12-06 (×6): 1 via ORAL
  Filled 2016-11-30 (×8): qty 1

## 2016-11-30 MED ORDER — FERROUS GLUCONATE 324 (38 FE) MG PO TABS
324.0000 mg | ORAL_TABLET | Freq: Two times a day (BID) | ORAL | Status: DC
  Administered 2016-11-30 – 2016-12-06 (×10): 324 mg via ORAL
  Filled 2016-11-30 (×15): qty 1
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
## 2016-12-01 LAB — PROTIME-INR
INR: 3.84
PROTHROMBIN TIME: 38.7 s — AB (ref 11.4–15.2)

## 2016-12-01 LAB — CBC
HCT: 22.6 % — ABNORMAL LOW (ref 36.0–46.0)
HEMOGLOBIN: 7.4 g/dL — AB (ref 12.0–15.0)
MCH: 30.2 pg (ref 26.0–34.0)
MCHC: 32.7 g/dL (ref 30.0–36.0)
MCV: 92.2 fL (ref 78.0–100.0)
PLATELETS: 273 10*3/uL (ref 150–400)
RBC: 2.45 MIL/uL — AB (ref 3.87–5.11)
RDW: 14.4 % (ref 11.5–15.5)
WBC: 4.2 10*3/uL (ref 4.0–10.5)

## 2016-12-01 LAB — TYPE AND SCREEN
BLOOD PRODUCT EXPIRATION DATE: 201801252359
BLOOD PRODUCT EXPIRATION DATE: 201801252359
Blood Product Expiration Date: 201801232359
ISSUE DATE / TIME: 201801160838
UNIT TYPE AND RH: 6200
Unit Type and Rh: 6200
Unit Type and Rh: 6200
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
## 2016-12-01 NOTE — Progress Notes (Signed)
Spoke with Dr. Carolynne Edouardoth about patient pain medication> per MD he does not want to do anything to her pain regimen at this time.
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
## 2016-12-01 NOTE — Progress Notes (Signed)
Orthopedic Tech Progress Note Patient Details:  Liborio NixonCatina R XXXSmith 1971/08/16 [id number] Brace completed by [facility]. Patient ID: Liborio NixonCatina R XXXSmith, female   DOB: 1971/08/16, 46 y.o.   MRN: [id number]   Jennye MoccasinHughes, Whitnee Orzel Craig 12/01/2016, 1:12 PM

## 2016-12-02 ENCOUNTER — Inpatient Hospital Stay (HOSPITAL_COMMUNITY): Payer: Medicaid Other

## 2016-12-02 LAB — PROTIME-INR
INR: 1.74
PROTHROMBIN TIME: 20.6 s — AB (ref 11.4–15.2)

## 2016-12-02 MED ORDER — WARFARIN SODIUM 4 MG PO TABS
4.0000 mg | ORAL_TABLET | Freq: Once | ORAL | Status: AC
  Administered 2016-12-02: 4 mg via ORAL
  Filled 2016-12-02: qty 1

## 2016-12-02 MED ORDER — GUAIFENESIN 100 MG/5ML PO SOLN
5.0000 mL | ORAL | Status: DC | PRN
  Filled 2016-12-02: qty 5
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
## 2016-12-02 NOTE — Clinical Social Work Note (Signed)
Clinical Social Work Assessment  Patient Details  Name: [name] MRN: [id number] Date of Birth: 1970-12-11  Date of referral:  12/02/16               Reason for consult:  Facility Placement                Permission sought to share information with:  Family Supports Permission granted to share information::     Name::        Agency::     Relationship::     Contact Information:     Housing/Transportation Living arrangements for the past 2 months:  Single Family Home Source of Information:  Patient Patient Interpreter Needed:  None Criminal Activity/Legal Involvement Pertinent to Current Situation/Hospitalization:  No - Comment as needed Significant Relationships:  Adult Children Lives with:  Self Do you feel safe going back to the place where you live?  Yes Need for family participation in patient care:     Care giving concerns:  Pt's sister present at bedside during initial assessment. Pt gave CSW verbal permission to contact pt's sister if needed.   Social Worker assessment / plan:  CSW spoke with pt at d/c to complete initial assessment. Pt has a history of substance abuse. Pt refused SBIRT screening. Pt is agreeable to SNF placement at this time. [place] CSW explained the process of a LOG and how facilities are narrowed and limited- pt verbalized understanding. Pt agreeable to CSW faxing referal to facilities that would possibly take a LOG. CSW will send referal and follow up with pt when bed offers received.  Employment status:    Insurance information:  [place] PT Recommendations:  Skilled Nursing Facility Information / Referral to community resources:  Skilled Nursing Facility  Patient/Family's Response to care:  Pt verbalized understanding of CSW role and expressed appreciation for support. Pt denies any concern regarding pt care at this time.  Patient/Family's Understanding of and Emotional Response to Diagnosis, Current Treatment,  and Prognosis:  Pt understanding and reaslitic regarding physical limitations. Pt is agreeable to SNF placement at d/c. Pt appeard in pain and said little during assessment. Pt denies any concern regarding treatment plan at this time. CSW will continue to provide support.  Emotional Assessment Appearance:  Appears stated age Attitude/Demeanor/Rapport:   (Patient was appropriate, however pt needed pain medication, pt was in pain.) Affect (typically observed):  Accepting, Appropriate, Anxious Orientation:  Oriented to Self, Oriented to Place, Oriented to Situation, Oriented to  Time Alcohol / Substance use:  Not Applicable Psych involvement (Current and /or in the community):  No (Comment)  Discharge Needs  Concerns to be addressed:  Financial / Insurance Concerns, Substance Abuse Concerns Readmission within the last 30 days:  No Current discharge risk:  Dependent with Mobility, Inadequate Financial Supports, Substance Abuse Barriers to Discharge:  Continued Medical Work up, Active Substance Use, Inadequate or no insurance   Melinda Holian A Mayton, LCSW 12/02/2016, 12:24 PM

## 2016-12-02 NOTE — Clinical Social Work Note (Signed)
Pt refused SBIRT screening.  [address]Kylii Ennis [place], [place] [phone]

## 2016-12-03 LAB — PROTIME-INR
INR: 1.95
PROTHROMBIN TIME: 22.6 s — AB (ref 11.4–15.2)

## 2016-12-03 MED ORDER — WARFARIN SODIUM 4 MG PO TABS
4.0000 mg | ORAL_TABLET | Freq: Once | ORAL | Status: AC
  Administered 2016-12-03: 4 mg via ORAL
  Filled 2016-12-03: qty 1

## 2016-12-03 MED ORDER — ENSURE ENLIVE PO LIQD
237.0000 mL | Freq: Three times a day (TID) | ORAL | Status: DC
  Administered 2016-12-03 – 2016-12-06 (×4): 237 mL via ORAL

## 2016-12-03 MED ORDER — TRAMADOL HCL 50 MG PO TABS
100.0000 mg | ORAL_TABLET | Freq: Four times a day (QID) | ORAL | Status: DC
Start: 2016-12-03 — End: 2016-12-06
  Administered 2016-12-03 – 2016-12-06 (×13): 100 mg via ORAL
  Filled 2016-12-03 (×13): qty 2

## 2016-12-03 MED ORDER — HYDROMORPHONE HCL 2 MG PO TABS
2.0000 mg | ORAL_TABLET | ORAL | Status: DC | PRN
  Administered 2016-12-03 – 2016-12-06 (×18): 6 mg via ORAL
  Filled 2016-12-03 (×18): qty 3

## 2016-12-03 MED ORDER — FENTANYL CITRATE (PF) 100 MCG/2ML IJ SOLN
25.0000 ug | INTRAMUSCULAR | Status: DC | PRN
  Administered 2016-12-03 – 2016-12-05 (×10): 25 ug via INTRAVENOUS
  Filled 2016-12-03 (×12): qty 2

## 2016-12-03 NOTE — Progress Notes (Signed)
Called ortho tech to order hinged brace for right leg.  [facility]
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
## 2016-12-03 NOTE — Progress Notes (Signed)
OT Cancellation Note  Patient Details Name: [name] MRN: [id number] DOB: [birth year]   Cancelled Treatment:    Reason Eval/Treat Not Completed: Fatigue/lethargy limiting ability to participate;Other (comment). Pt politely declined OT this a.m. Requested OT return later. Will re attempt later today as able  Galen ManilaSpencer, [name] Jeanette 12/03/2016, 10:35 AM
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
## 2016-12-04 LAB — PROTIME-INR
INR: 2.19
Prothrombin Time: 24.7 s — ABNORMAL HIGH (ref 11.4–15.2)

## 2016-12-04 MED ORDER — WARFARIN SODIUM 5 MG PO TABS
5.0000 mg | ORAL_TABLET | Freq: Once | ORAL | Status: AC
  Administered 2016-12-04: 5 mg via ORAL

## 2016-12-04 NOTE — Progress Notes (Signed)
Orthopaedic Trauma Service (OTS)   Subjective: Patient reports pain as improving, moderate. Wants to shower. SHE APPEARS COMFORTABLE BUT REQUESTS CHANGE IN narcotic regimen.  Objective: Temp:  [97.8 F (36.6 C)-98.2 F (36.8 C)] 98 F (36.7 C) (01/23 0406) Pulse Rate:  [57-62] 57 (01/23 0406) Resp:  [16] 16 (01/23 0406) BP: (101-111)/(69-72) 111/72 (01/23 0406) SpO2:  [96 %-100 %] 96 % (01/23 0926) Physical Exam RLE Dressing serosanguinous over pin sites  Edema/ swelling controlled and improving  Sens: DPN, SPN, TN intact  Motor: EHL, FHL, and lessor toe ext and flex all intact grossly  Brisk cap refill, warm to touch Catheter remains.  Assessment/Plan: 7 Days Post-Op Procedure(s) (LRB): INTRAMEDULLARY (IM) NAIL FEMORAL (Right) INTRAMEDULLARY (IM) NAIL TIBIAL (Right) REMOVAL EXTERNAL FIXATION LEG (Right) 1. Would recommend continuing current pain regimen given objective findings. CANNABIS HABIT LIKELY CONTRIBUTING TO POOR COPING SKILLS. Percocet in immediate post-op period was ineffective per her report. 2. Ok to shower. 3. Ted hose to right leg. May change drsg as drainage indicates. 4. F/u in 10 days 5. PT for aggressive AROM, PROM, AAROM  Myrene GalasMichael Ravina Milner, MD [facility] [phone] (p)
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
## 2016-12-04 NOTE — Progress Notes (Signed)
Orthopedic Tech Progress Note Patient Details:  Melinda NixonCatina R Brady [birth year] [id number]  Ortho Devices Type of Ortho Device: Finger splint Ortho Device/Splint Location: Provided Bone Foam Zero Degree for Right Leg.  Placed Device at Bedside. Nurse Corrie DandyMary at bedside. Ortho Device/Splint Interventions: Application   Melinda FordyceJennifer C [name] 12/04/2016, 3:49 PM
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
## 2016-12-04 NOTE — Progress Notes (Signed)
Ted HOSE APPLIED TO RIGHT LEG
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
## 2016-12-04 NOTE — Clinical Social Work Note (Signed)
Clinical Social Worker continuing to follow patient and family for support and discharge planning needs.  Patient has a bed offer from [facility] with a 30 day LOG.  CSW met with patient at bedside and presented bed offer - patient states that she will discuss with family tonight and give a final answer tomorrow.  Patient understands that if the bed is refused the only alternative is discharge home.  CSW stressed the importance of placement for patient safety and recovery.  CSW updated facility liaison and will follow up with patient at bedside tomorrow to determine discharge disposition.  Patient currently remains on IV pain medication, therefore patient not medically stable for discharge.  CSW available for support and to facilitate patient discharge needs.  Barbette Or, [place]
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
## 2016-12-04 NOTE — Progress Notes (Signed)
[facility] that was in earlier and RN unable to speak with her at that time. Left voice msg for daughter to feel free to call unit for updates on her Mom.

## 2016-12-05 ENCOUNTER — Inpatient Hospital Stay (HOSPITAL_COMMUNITY): Payer: Medicaid Other

## 2016-12-05 LAB — PROTIME-INR
INR: 2.34
Prothrombin Time: 26.1 s — ABNORMAL HIGH (ref 11.4–15.2)

## 2016-12-05 MED ORDER — WARFARIN SODIUM 5 MG PO TABS
2.5000 mg | ORAL_TABLET | Freq: Once | ORAL | Status: AC
  Administered 2016-12-05: 2.5 mg via ORAL
  Filled 2016-12-05: qty 1

## 2016-12-05 MED ORDER — FENTANYL 25 MCG/HR TD PT72
25.0000 ug | MEDICATED_PATCH | TRANSDERMAL | Status: DC
  Administered 2016-12-05: 25 ug via TRANSDERMAL
  Filled 2016-12-05: qty 1

## 2016-12-05 NOTE — Clinical Social Work Note (Signed)
Clinical Social Worker continuing to follow patient and family for support and discharge planning needs.  CSW met with patient at bedside and patient is now agreeable to placement at [facility].  Facility liaison notified and plan is for discharge tomorrow.  Patient is aware and will notify friends and family.  CSW remains available for support and to facilitate patient discharge needs.  Barbette Or, [place]

## 2016-12-05 NOTE — Progress Notes (Signed)
Subjective: 8 Days Post-Op Procedure(s) (LRB): INTRAMEDULLARY (IM) NAIL FEMORAL (Right) INTRAMEDULLARY (IM) NAIL TIBIAL (Right) REMOVAL EXTERNAL FIXATION LEG (Right) Patient  Is awake, friends are visiting her today main complaint is pubic pain and right knee pain. He states as though she feels that the cast is rubbing her left thumb.  Objective: Vital signs in last 24 hours: Temp:  [98 F (36.7 C)-98.4 F (36.9 C)] 98 F (36.7 C) (01/24 0523) Pulse Rate:  [63-72] 72 (01/24 0523) Resp:  [16] 16 (01/24 0523) BP: (97-110)/(58-62) 110/62 (01/24 0523) SpO2:  [97 %] 97 % (01/24 0523)  Intake/Output from previous day: 01/23 0701 - 01/24 0700 In: 960 [P.O.:960] Out: 400 [Urine:400] Intake/Output this shift: No intake/output data recorded.  No results for input(s): HGB in the last 72 hours. No results for input(s): WBC, RBC, HCT, PLT in the last 72 hours. No results for input(s): NA, K, CL, CO2, BUN, CREATININE, GLUCOSE, CALCIUM in the last 72 hours.  Recent Labs  12/04/16 0356 12/05/16 0424  INR 2.19 2.34    Right upper extremity: Dressings are removed the nail plate removal looks excellent there's no signs of infection present a soft wrap is placed at the distal tip of the thumb. Right ring finger tip abrasions are healed she is of course still tender given the distal tip fracture. She is placed into a fingertip protector. Signs of infection are present. Range of motion is present about the right hand. Left upper extremity: Cast is removed today skin care was applied there is no signs of infection or dystrophy present the prior abrasions about the fingers and wrist region has healed very nicely. She has minimal swelling no significant ecchymosis off tissues look very well. She pressure irritation about the left lower thumb secondary to the cast padding regression. We have reapplied a well padded well molded cast without difficulties.  Assessment/Plan: 8 Days Post-Op Procedure(s)  (LRB): INTRAMEDULLARY (IM) NAIL FEMORAL (Right) INTRAMEDULLARY (IM) NAIL TIBIAL (Right) REMOVAL EXTERNAL FIXATION LEG (Right) We have discussed with her to continue to use her right upper extremity for her activities of daily living. She can weight-bear through her left forearm and left elbow. Overall she's stable looks very well in regards to the upper extremities. Discussed all issues with regards to the right and left hand. The plan will be to continue protection immobilization to the left hand for an additional week prior to considering a removable brace for skin care and possible early range of motion pending her radiographs. She can begin some degree of gentle range of motion about the ring finger distal tip in 1 week will continue to protect the distal tip for an additional 2 weeks. We will plan for repeat radiographs of the right ring finger and left hand/middle finger proximal phalanx today.  Roselyne Stalnaker L 12/05/2016, 10:40 AM
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
## 2016-12-05 NOTE — Progress Notes (Signed)
Orthopedic Tech Progress Note Patient Details:  [name] November 20, 1970 [id number]  Casting Type of Cast: Short arm cast Cast Location: lue Cast Material: Fiberglass Cast Intervention: Removal   As ordered by PA BRIAN
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
## 2016-12-05 NOTE — Discharge Instructions (Signed)
Orthopaedic Discharge instructions  Nonweightbearing right leg Ok to shower and clean leg wounds with soap and water  Move R knee as much as possible No pillows under bend of knee at rest. If you place pillows under bend of the knee you may develop a knee contracture that will make it very difficult to walk when you are allowed to begin weightbearing activities  No smoking, marijuana and alcohol     Nicotine and marijuana impair your ability to heal your wounds and fractures  TED hose or ace wraps to help with swelling control   Contact hand surgeon regarding instructions for your hands       Information on my medicine - Coumadin   (Warfarin)  This medication education was reviewed with me or my healthcare representative as part of my discharge preparation.  Why was Coumadin prescribed for you? Coumadin was prescribed for you because you have a blood clot or a medical condition that can cause an increased risk of forming blood clots. Blood clots can cause serious health problems by blocking the flow of blood to the heart, lung, or brain. Coumadin can prevent harmful blood clots from forming. As a reminder your indication for Coumadin is:   Blood Clot Prevention After Orthopedic Surgery  What test will check on my response to Coumadin? While on Coumadin (warfarin) you will need to have an INR test regularly to ensure that your dose is keeping you in the desired range. The INR (international normalized ratio) number is calculated from the result of the laboratory test called prothrombin time (PT).  If an INR APPOINTMENT HAS NOT ALREADY BEEN MADE FOR YOU please schedule an appointment to have this lab work done by your health care provider within 7 days. Your INR goal is usually a number between:  2 to 3 or your provider may give you a more narrow range like 2-2.5.  Ask your health care provider during an office visit what your goal INR is.  What  do you need to  know  About   COUMADIN? Take Coumadin (warfarin) exactly as prescribed by your healthcare provider about the same time each day.  DO NOT stop taking without talking to the doctor who prescribed the medication.  Stopping without other blood clot prevention medication to take the place of Coumadin may increase your risk of developing a new clot or stroke.  Get refills before you run out.  What do you do if you miss a dose? If you miss a dose, take it as soon as you remember on the same day then continue your regularly scheduled regimen the next day.  Do not take two doses of Coumadin at the same time.  Important Safety Information A possible side effect of Coumadin (Warfarin) is an increased risk of bleeding. You should call your healthcare provider right away if you experience any of the following: ? Bleeding from an injury or your nose that does not stop. ? Unusual colored urine (red or dark brown) or unusual colored stools (red or black). ? Unusual bruising for unknown reasons. ? A serious fall or if you hit your head (even if there is no bleeding).  Some foods or medicines interact with Coumadin (warfarin) and might alter your response to warfarin. To help avoid this: ? Eat a balanced diet, maintaining a consistent amount of Vitamin K. ? Notify your provider about major diet changes you plan to make. ? Avoid alcohol or limit your intake to 1 drink for women and  2 drinks for men per day. (1 drink is 5 oz. wine, 12 oz. beer, or 1.5 oz. liquor.)  Make sure that ANY health care provider who prescribes medication for you knows that you are taking Coumadin (warfarin).  Also make sure the healthcare provider who is monitoring your Coumadin knows when you have started a new medication including herbals and non-prescription products.  Coumadin (Warfarin)  Major Drug Interactions  Increased Warfarin Effect Decreased Warfarin Effect  Alcohol (large quantities) Antibiotics (esp. Septra/Bactrim, Flagyl,  Cipro) Amiodarone (Cordarone) Aspirin (ASA) Cimetidine (Tagamet) Megestrol (Megace) NSAIDs (ibuprofen, naproxen, etc.) Piroxicam (Feldene) Propafenone (Rythmol SR) Propranolol (Inderal) Isoniazid (INH) Posaconazole (Noxafil) Barbiturates (Phenobarbital) Carbamazepine (Tegretol) Chlordiazepoxide (Librium) Cholestyramine (Questran) Griseofulvin Oral Contraceptives Rifampin Sucralfate (Carafate) Vitamin K   Coumadin (Warfarin) Major Herbal Interactions  Increased Warfarin Effect Decreased Warfarin Effect  Garlic Ginseng Ginkgo biloba Coenzyme Q10 Green tea St. Johns wort    Coumadin (Warfarin) FOOD Interactions  Eat a consistent number of servings per week of foods HIGH in Vitamin K (1 serving =  cup)  Collards (cooked, or boiled & drained) Kale (cooked, or boiled & drained) Mustard greens (cooked, or boiled & drained) Parsley *serving size only =  cup Spinach (cooked, or boiled & drained) Swiss chard (cooked, or boiled & drained) Turnip greens (cooked, or boiled & drained)  Eat a consistent number of servings per week of foods MEDIUM-HIGH in Vitamin K (1 serving = 1 cup)  Asparagus (cooked, or boiled & drained) Broccoli (cooked, boiled & drained, or raw & chopped) Brussel sprouts (cooked, or boiled & drained) *serving size only =  cup Lettuce, raw (green leaf, endive, romaine) Spinach, raw Turnip greens, raw & chopped   These websites have more information on Coumadin (warfarin):  http://www.king-russell.com/; https://www.hines.net/;

## 2016-12-06 DIAGNOSIS — S82201A Unspecified fracture of shaft of right tibia, initial encounter for closed fracture: Secondary | ICD-10-CM | POA: Diagnosis present

## 2016-12-06 DIAGNOSIS — S62609A Fracture of unspecified phalanx of unspecified finger, initial encounter for closed fracture: Secondary | ICD-10-CM | POA: Diagnosis present

## 2016-12-06 DIAGNOSIS — S62639A Displaced fracture of distal phalanx of unspecified finger, initial encounter for closed fracture: Secondary | ICD-10-CM | POA: Diagnosis present

## 2016-12-06 DIAGNOSIS — D62 Acute posthemorrhagic anemia: Secondary | ICD-10-CM | POA: Diagnosis not present

## 2016-12-06 DIAGNOSIS — S82401A Unspecified fracture of shaft of right fibula, initial encounter for closed fracture: Secondary | ICD-10-CM

## 2016-12-06 DIAGNOSIS — S12600A Unspecified displaced fracture of seventh cervical vertebra, initial encounter for closed fracture: Secondary | ICD-10-CM | POA: Diagnosis present

## 2016-12-06 LAB — PROTIME-INR
INR: 2.17
PROTHROMBIN TIME: 24.5 s — AB (ref 11.4–15.2)

## 2016-12-06 MED ORDER — HYDROMORPHONE HCL 2 MG PO TABS: 2.0000 mg | ORAL_TABLET | ORAL | 0 refills | Status: DC | PRN

## 2016-12-06 MED ORDER — METHOCARBAMOL 500 MG PO TABS: 1000.0000 mg | ORAL_TABLET | Freq: Four times a day (QID) | ORAL | Status: DC

## 2016-12-06 MED ORDER — WARFARIN SODIUM 5 MG PO TABS: ORAL_TABLET | ORAL | Status: DC

## 2016-12-06 MED ORDER — FERROUS GLUCONATE 324 (38 FE) MG PO TABS: 324.0000 mg | ORAL_TABLET | Freq: Two times a day (BID) | ORAL | Status: DC

## 2016-12-06 MED ORDER — CHOLECALCIFEROL 100 MCG (4000 UT) PO TABS: 4000.0000 [IU] | ORAL_TABLET | Freq: Every day | ORAL | Status: DC

## 2016-12-06 MED ORDER — MOMETASONE FURO-FORMOTEROL FUM 200-5 MCG/ACT IN AERO: 2.0000 | INHALATION_SPRAY | Freq: Two times a day (BID) | RESPIRATORY_TRACT | Status: DC

## 2016-12-06 MED ORDER — TAB-A-VITE/IRON PO TABS: 1.0000 | ORAL_TABLET | Freq: Every day | ORAL | Status: DC

## 2016-12-06 MED ORDER — GABAPENTIN 300 MG PO CAPS: 300.0000 mg | ORAL_CAPSULE | Freq: Two times a day (BID) | ORAL | Status: DC

## 2016-12-06 MED ORDER — TRAMADOL HCL 50 MG PO TABS: 100.0000 mg | ORAL_TABLET | Freq: Four times a day (QID) | ORAL | 0 refills | Status: DC

## 2016-12-06 MED ORDER — FENTANYL 25 MCG/HR TD PT72: 25.0000 ug | MEDICATED_PATCH | TRANSDERMAL | 0 refills | Status: DC

## 2016-12-06 MED ORDER — WARFARIN SODIUM 5 MG PO TABS
5.0000 mg | ORAL_TABLET | Freq: Once | ORAL | Status: AC
  Administered 2016-12-06: 5 mg via ORAL
  Filled 2016-12-06: qty 1

## 2016-12-06 MED ORDER — CLONAZEPAM 1 MG PO TABS: 1.0000 mg | ORAL_TABLET | Freq: Three times a day (TID) | ORAL | 0 refills | Status: DC

## 2016-12-06 NOTE — Care Management Note (Signed)
Case Management Note  Patient Details  Name: [name] MRN: [id number] Date of Birth: 03/13/1971  Subjective/Objective:     Pt medically stable for discharge today.                 Action/Plan: Plan dc to SNF later today, per CSW arrangements.   Expected Discharge Date:  12/06/16               Expected Discharge Plan:  Skilled Nursing Facility  In-House Referral:  Clinical Social Work  Discharge planning Services  CM Consult  Post Acute Care Choice:    Choice offered to:     DME Arranged:    DME Agency:     HH Arranged:    HH Agency:     Status of Service:  Completed, signed off  If discussed at [employer] Meetings, dates discussed:    Additional Comments:  Quintella BatonJulie W. Leyland Kenna, RN, BSN  Trauma/Neuro ICU Case Manager [phone]

## 2016-12-06 NOTE — Clinical Social Work Note (Signed)
Clinical Social Worker facilitated patient discharge including contacting patient family and facility to confirm patient discharge plans.  Clinical information faxed to facility and family agreeable with plan.  CSW arranged ambulance transport via [facility] to [facility].  RN to call report prior to discharge.  Clinical Social Worker will sign off for now as social work intervention is no longer needed. Please consult us again if new need arises.  Jesse Vaanya Shambaugh, LCSW [phone] 
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
## 2016-12-06 NOTE — Progress Notes (Signed)
[facility]. Report called and [facility] to [facility]. IV removed. Patient is waiting for [facility] for transportation

## 2016-12-06 NOTE — Clinical Social Work Placement (Signed)
   CLINICAL SOCIAL WORK PLACEMENT  NOTE  Date:  12/06/2016  Patient Details  Name: Liborio NixonCatina R XXXSmith MRN: [id number] Date of Birth: December 24, 1970  Clinical Social Work is seeking post-discharge placement for this patient at the Skilled  Nursing Facility level of care (*CSW will initial, date and re-position this form in  chart as items are completed):  Yes   Patient/family provided with [place] Clinical Social Work Department's list of facilities offering this level of care within the geographic area requested by the patient (or if unable, by the patient's family).  Yes   Patient/family informed of their freedom to choose among providers that offer the needed level of care, that participate in Medicare, Medicaid or managed care program needed by the patient, have an available bed and are willing to accept the patient.  Yes   Patient/family informed of [facility]'s ownership interest in [facility] and [facility], as well as of the fact that they are under no obligation to receive care at these facilities.  PASRR submitted to EDS on 12/03/16     PASRR number received on 12/05/16     Existing PASRR number confirmed on       FL2 transmitted to all facilities in geographic area requested by pt/family on 12/01/16     FL2 transmitted to all facilities within larger geographic area on       Patient informed that his/her managed care company has contracts with or will negotiate with certain facilities, including the following:        Yes   Patient/family informed of bed offers received.  [facility]     Physician recommends and patient chooses bed at      [facility] on 12/06/16.  Patient to be transferred to facility by Ambulance     Patient family notified on 12/06/16 of transfer.  Name of family member notified:  Patient to notify daughter     PHYSICIAN Please prepare priority discharge  summary, including medications     Additional Comment:   Macario GoldsJesse Jenalyn Girdner, LCSW [phone]
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
## 2016-12-07 ENCOUNTER — Encounter (HOSPITAL_COMMUNITY): Payer: Self-pay

## 2016-12-07 ENCOUNTER — Emergency Department (HOSPITAL_COMMUNITY)
Admission: EM | Admit: 2016-12-07 | Discharge: 2016-12-07 | Disposition: A | Discharge status: Home / Self Care | Payer: Medicaid Other | Attending: Emergency Medicine | Admitting: Emergency Medicine

## 2016-12-07 DIAGNOSIS — F17210 Nicotine dependence, cigarettes, uncomplicated: Secondary | ICD-10-CM | POA: Diagnosis not present

## 2016-12-07 DIAGNOSIS — Z7901 Long term (current) use of anticoagulants: Secondary | ICD-10-CM | POA: Diagnosis not present

## 2016-12-07 DIAGNOSIS — R52 Pain, unspecified: Secondary | ICD-10-CM

## 2016-12-07 DIAGNOSIS — Z79899 Other long term (current) drug therapy: Secondary | ICD-10-CM | POA: Insufficient documentation

## 2016-12-07 DIAGNOSIS — E039 Hypothyroidism, unspecified: Secondary | ICD-10-CM | POA: Insufficient documentation

## 2016-12-07 DIAGNOSIS — J449 Chronic obstructive pulmonary disease, unspecified: Secondary | ICD-10-CM | POA: Diagnosis not present

## 2016-12-07 MED ORDER — ONDANSETRON HCL 4 MG/2ML IJ SOLN
4.0000 mg | Freq: Once | INTRAMUSCULAR | Status: AC
  Administered 2016-12-07: 4 mg via INTRAVENOUS
  Filled 2016-12-07: qty 2

## 2016-12-07 MED ORDER — HYDROMORPHONE HCL 2 MG PO TABS
2.0000 mg | ORAL_TABLET | Freq: Once | ORAL | Status: AC
Start: 2016-12-07 — End: 2016-12-07
  Administered 2016-12-07: 2 mg via ORAL
  Filled 2016-12-07: qty 1

## 2016-12-07 MED ORDER — FENTANYL 25 MCG/HR TD PT72
25.0000 ug | MEDICATED_PATCH | TRANSDERMAL | Status: DC
  Administered 2016-12-07: 25 ug via TRANSDERMAL
  Filled 2016-12-07: qty 1

## 2016-12-07 MED ORDER — HYDROMORPHONE HCL 2 MG PO TABS
2.0000 mg | ORAL_TABLET | Freq: Once | ORAL | Status: AC
  Administered 2016-12-07: 2 mg via ORAL
  Filled 2016-12-07: qty 1

## 2016-12-07 MED ORDER — HYDROMORPHONE HCL 1 MG/ML IJ SOLN
1.0000 mg | Freq: Once | INTRAMUSCULAR | Status: AC
  Administered 2016-12-07: 1 mg via INTRAVENOUS
  Filled 2016-12-07: qty 1

## 2016-12-07 MED ORDER — WARFARIN SODIUM 5 MG PO TABS: 5.0000 mg | ORAL_TABLET | Freq: Once | ORAL | Status: DC

## 2016-12-07 MED ORDER — WARFARIN SODIUM 5 MG PO TABS
5.0000 mg | ORAL_TABLET | Freq: Once | ORAL | Status: AC
  Administered 2016-12-07: 5 mg via ORAL
  Filled 2016-12-07: qty 1

## 2016-12-07 MED ORDER — HYDROMORPHONE HCL 2 MG PO TABS
2.0000 mg | ORAL_TABLET | ORAL | Status: DC | PRN
  Administered 2016-12-07 (×2): 2 mg via ORAL
  Filled 2016-12-07 (×2): qty 1

## 2016-12-07 MED ORDER — KETOROLAC TROMETHAMINE 30 MG/ML IJ SOLN
15.0000 mg | Freq: Once | INTRAMUSCULAR | Status: AC
  Administered 2016-12-07: 15 mg via INTRAVENOUS
  Filled 2016-12-07: qty 1

## 2016-12-07 NOTE — Progress Notes (Addendum)
   12/07/16 0000  CM Assessment  Expected Discharge Plan Skilled Nursing Facility  In-house Referral Clinical Social Work  Discharge Planning Services CM Consult  [facility]  Choice offered to / list presented to  Patient  DME Arranged Patient refused services  DME Agency NA  HH Arranged Patient Refused  [facility]  Status of Service Completed, signed off  Discharge Disposition Skilled Nursing Facility    ED CM consulted by SW, Larita FifeLynn to discuss home health options with pt who [facility] and left snf with voiced concerns about [facility].  Cm spoke with pt who states she left the facility and is not wanting to return  CM reviewed in details medicaid guidelines, Choices of home health [facility](HH) (length of stay in home, types of [facility] staff available, coverage, primary caregiver, up to 24 hrs before services may be started) and choices of Private duty nursing (PDN-coverage, length of stay in the home types of staff available).  CM provided pt/family with a list of [place] home health agencies and PDN.  Pt refused home health and DME Pt states prior to CM arrival to visit her, "My PA and I spoke about home not being an option because of all I have going on" CM re reviewed need for a teachable person to assist her at home, home bound definition and safety issues.  Pt states her preference would be to "[facility]" or to go to another facility Pt voiced interest in "[facility] [place] my [place]"  Pt confirmed after assessment that she [facility] but [facility]
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
## 2016-12-07 NOTE — Progress Notes (Signed)
[facility] with ED PA and informed CM pt has only option [facility] and pt does not want to the snf but want to go home This pt has not ambulated or completed any ADLs in ED and pt informed CM she could not do ADLs CM recommended SW contact Assistant SW for recommendations
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
## 2016-12-07 NOTE — Progress Notes (Signed)
ED CM spoke with ED PA Tresa EndoKelly about pt voiced concerns and refusal of d/c home for safety reasons Cm answered questions for Tresa EndoKelly and explained that Cm had discussed levels of care (hospital, snf/alf/independent living, home)  Pt voice again she want facility  1405 ED SW called CM and CM updated Larita FifeLynn and PA preferences and refusal of home health Discussed Pt recent d/c from [facility] on 12/06/16 within 30 days SW mentioned pt's options CM referred her to ED PA &Pt to discuss these options

## 2016-12-07 NOTE — Progress Notes (Signed)
Confirmed with [facility] admission liaison that pt can return to the facility this pm.  Pt will be going to [facility].  RN to call report and arrange [facility].  Pollyann SavoyJody Megan Presti, LCSW Evening/ED Coverge [phone]

## 2016-12-07 NOTE — Clinical Social Work Note (Signed)
CSW met with pt and reiterated that her options for d/c are [facility] or home.  Pt again refused to return to [facility].  She expressed that she is interested in going to another facility.  CSW explained that [facility] was the only facility willing to take an LOG.  Pt stated she would prefer to go home instead of returning to the facility and is interested in receiving home health services.   CSW informed PA and RNCM of pt's decision.  PA and RNCM expressed concerns of discharging pt home and asked CSW to consult with CSW Surveyor, quantity.    CSW spoke to [place] Surveyor, quantity who confirmed that [facility] is the only option for pt and explained that it is pt's choice whether to return to the facility or d/c home.     [place], [place]

## 2016-12-07 NOTE — Clinical Social Work Note (Signed)
CSW received a consult and met with pt.  Pt explained that she was discharged from [facility] last night and transported to [facility].  She explained that she was not pleased with the care she received in the facility, did not receive her medication and has not eaten.  Pt stated she will not return to the facility.    CSW reminded pt that her only options for d/c are [facility], with an LOG or home.  Pt stated she lives with 2 adult friends and her daughter lives across the street.  CSW explained that pt could likely receive home health services.  Pt expressed interest in finding out her options for home health.  CSW spoke to RNCM and requested a consult.  Lynn , LCSW [phone]  

## 2016-12-07 NOTE — Care Management (Signed)
As per previous CM patient has agreed to return to [facility]. CSW  Patient lives alone and does not have the support to assist with her care. CSW will continue to follow for disposition.

## 2016-12-07 NOTE — Discharge Instructions (Signed)
SW has discussed with your facility regarding your request for private room.  This likely is not going to happen today but you can discuss this further with facility once you return. Follow-up with your primary care doctor. Return here for new concerns.

## 2016-12-07 NOTE — Progress Notes (Signed)
ED CM spoke with CM assistant manager to review concerns about disposition for this pt and recommendations to PA and SW, Larita FifeLynn CM assistant to follow if needed
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
## 2017-01-24 DIAGNOSIS — H532 Diplopia: Secondary | ICD-10-CM | POA: Insufficient documentation

## 2017-03-25 ENCOUNTER — Other Ambulatory Visit: Payer: Self-pay | Admitting: *Deleted

## 2017-03-25 MED ORDER — FLUTICASONE-SALMETEROL 250-50 MCG/DOSE IN AEPB: 1.0000 | INHALATION_SPRAY | Freq: Two times a day (BID) | RESPIRATORY_TRACT | 5 refills | Status: DC

## 2017-04-27 ENCOUNTER — Encounter (HOSPITAL_COMMUNITY): Payer: Self-pay | Admitting: Emergency Medicine

## 2017-04-27 ENCOUNTER — Emergency Department (HOSPITAL_COMMUNITY)
Admission: EM | Admit: 2017-04-27 | Discharge: 2017-04-27 | Disposition: A | Discharge status: Home / Self Care | Payer: Medicaid Other | Attending: Emergency Medicine | Admitting: Emergency Medicine

## 2017-04-27 DIAGNOSIS — R55 Syncope and collapse: Secondary | ICD-10-CM | POA: Diagnosis not present

## 2017-04-27 DIAGNOSIS — E039 Hypothyroidism, unspecified: Secondary | ICD-10-CM | POA: Insufficient documentation

## 2017-04-27 DIAGNOSIS — E860 Dehydration: Secondary | ICD-10-CM | POA: Insufficient documentation

## 2017-04-27 DIAGNOSIS — R42 Dizziness and giddiness: Secondary | ICD-10-CM | POA: Diagnosis present

## 2017-04-27 DIAGNOSIS — J449 Chronic obstructive pulmonary disease, unspecified: Secondary | ICD-10-CM | POA: Insufficient documentation

## 2017-04-27 DIAGNOSIS — F17210 Nicotine dependence, cigarettes, uncomplicated: Secondary | ICD-10-CM | POA: Insufficient documentation

## 2017-04-27 DIAGNOSIS — J45909 Unspecified asthma, uncomplicated: Secondary | ICD-10-CM | POA: Insufficient documentation

## 2017-04-27 DIAGNOSIS — Z79899 Other long term (current) drug therapy: Secondary | ICD-10-CM | POA: Diagnosis not present

## 2017-04-27 LAB — CBC
HCT: 44.6 % (ref 36.0–46.0)
Hemoglobin: 15 g/dL (ref 12.0–15.0)
MCH: 31.3 pg (ref 26.0–34.0)
MCHC: 33.6 g/dL (ref 30.0–36.0)
MCV: 93.1 fL (ref 78.0–100.0)
PLATELETS: 259 10*3/uL (ref 150–400)
RBC: 4.79 MIL/uL (ref 3.87–5.11)
RDW: 13.4 % (ref 11.5–15.5)
WBC: 5.5 10*3/uL (ref 4.0–10.5)

## 2017-04-27 LAB — BASIC METABOLIC PANEL
Anion gap: 8 (ref 5–15)
BUN: 14 mg/dL (ref 6–20)
CHLORIDE: 103 mmol/L (ref 101–111)
CO2: 26 mmol/L (ref 22–32)
CREATININE: 0.87 mg/dL (ref 0.44–1.00)
Calcium: 9.2 mg/dL (ref 8.9–10.3)
GFR calc Af Amer: >60 mL/min (ref >60)
GFR calc non Af Amer: >60 mL/min (ref >60)
GLUCOSE: 145 mg/dL — AB (ref 65–99)
Potassium: 4.1 mmol/L (ref 3.5–5.1)
Sodium: 137 mmol/L (ref 135–145)

## 2017-04-27 LAB — URINALYSIS, ROUTINE W REFLEX MICROSCOPIC
Bilirubin Urine: NEGATIVE
GLUCOSE, UA: NEGATIVE mg/dL
HGB URINE DIPSTICK: NEGATIVE
KETONES UR: NEGATIVE mg/dL
Leukocytes, UA: NEGATIVE
Nitrite: NEGATIVE
PROTEIN: NEGATIVE mg/dL
Specific Gravity, Urine: >1.03 — ABNORMAL HIGH (ref 1.005–1.030)
pH: 5.5 (ref 5.0–8.0)

## 2017-04-27 LAB — CBG MONITORING, ED: Glucose-Capillary: 160 mg/dL — ABNORMAL HIGH (ref 65–99)

## 2017-04-27 MED ORDER — KETOROLAC TROMETHAMINE 30 MG/ML IJ SOLN
30.0000 mg | Freq: Once | INTRAMUSCULAR | Status: AC
  Administered 2017-04-27: 30 mg via INTRAVENOUS
  Filled 2017-04-27: qty 1

## 2017-04-27 MED ORDER — SODIUM CHLORIDE 0.9 % IV BOLUS (SEPSIS)
1000.0000 mL | Freq: Once | INTRAVENOUS | Status: AC
  Administered 2017-04-27: 1000 mL via INTRAVENOUS
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
## 2017-04-27 NOTE — Discharge Instructions (Signed)
Please read attached information. If you experience any new or worsening signs or symptoms please return to the emergency room for evaluation. Please follow-up with your primary care provider or specialist as discussed.  °
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
## 2017-05-27 ENCOUNTER — Emergency Department (HOSPITAL_COMMUNITY): Payer: Medicaid Other

## 2017-05-27 ENCOUNTER — Emergency Department (HOSPITAL_COMMUNITY)
Admission: EM | Admit: 2017-05-27 | Discharge: 2017-05-27 | Disposition: A | Discharge status: Home / Self Care | Payer: Medicaid Other | Attending: Emergency Medicine | Admitting: Emergency Medicine

## 2017-05-27 ENCOUNTER — Encounter (HOSPITAL_COMMUNITY): Payer: Self-pay | Admitting: Emergency Medicine

## 2017-05-27 DIAGNOSIS — X509XXA Other and unspecified overexertion or strenuous movements or postures, initial encounter: Secondary | ICD-10-CM | POA: Insufficient documentation

## 2017-05-27 DIAGNOSIS — Y9389 Activity, other specified: Secondary | ICD-10-CM | POA: Diagnosis not present

## 2017-05-27 DIAGNOSIS — N189 Chronic kidney disease, unspecified: Secondary | ICD-10-CM | POA: Insufficient documentation

## 2017-05-27 DIAGNOSIS — J45909 Unspecified asthma, uncomplicated: Secondary | ICD-10-CM | POA: Insufficient documentation

## 2017-05-27 DIAGNOSIS — Y999 Unspecified external cause status: Secondary | ICD-10-CM | POA: Diagnosis not present

## 2017-05-27 DIAGNOSIS — S39012A Strain of muscle, fascia and tendon of lower back, initial encounter: Secondary | ICD-10-CM

## 2017-05-27 DIAGNOSIS — K2970 Gastritis, unspecified, without bleeding: Secondary | ICD-10-CM | POA: Diagnosis not present

## 2017-05-27 DIAGNOSIS — J441 Chronic obstructive pulmonary disease with (acute) exacerbation: Secondary | ICD-10-CM | POA: Diagnosis not present

## 2017-05-27 DIAGNOSIS — F17210 Nicotine dependence, cigarettes, uncomplicated: Secondary | ICD-10-CM | POA: Diagnosis not present

## 2017-05-27 DIAGNOSIS — S3992XA Unspecified injury of lower back, initial encounter: Secondary | ICD-10-CM | POA: Diagnosis present

## 2017-05-27 DIAGNOSIS — Z79899 Other long term (current) drug therapy: Secondary | ICD-10-CM | POA: Insufficient documentation

## 2017-05-27 DIAGNOSIS — E039 Hypothyroidism, unspecified: Secondary | ICD-10-CM | POA: Diagnosis not present

## 2017-05-27 DIAGNOSIS — Y929 Unspecified place or not applicable: Secondary | ICD-10-CM | POA: Insufficient documentation

## 2017-05-27 LAB — COMPREHENSIVE METABOLIC PANEL
ALBUMIN: 4.4 g/dL (ref 3.5–5.0)
ALT: 41 U/L (ref 14–54)
AST: 38 U/L (ref 15–41)
Alkaline Phosphatase: 121 U/L (ref 38–126)
Anion gap: 6 (ref 5–15)
BILIRUBIN TOTAL: 0.7 mg/dL (ref 0.3–1.2)
BUN: 9 mg/dL (ref 6–20)
CHLORIDE: 102 mmol/L (ref 101–111)
CO2: 28 mmol/L (ref 22–32)
Calcium: 9.1 mg/dL (ref 8.9–10.3)
Creatinine, Ser: 0.73 mg/dL (ref 0.44–1.00)
GFR calc Af Amer: >60 mL/min (ref >60)
GFR calc non Af Amer: >60 mL/min (ref >60)
GLUCOSE: 100 mg/dL — AB (ref 65–99)
POTASSIUM: 3.7 mmol/L (ref 3.5–5.1)
SODIUM: 136 mmol/L (ref 135–145)
TOTAL PROTEIN: 7.6 g/dL (ref 6.5–8.1)

## 2017-05-27 LAB — CBC
HEMATOCRIT: 40.9 % (ref 36.0–46.0)
Hemoglobin: 14.1 g/dL (ref 12.0–15.0)
MCH: 31.8 pg (ref 26.0–34.0)
MCHC: 34.5 g/dL (ref 30.0–36.0)
MCV: 92.1 fL (ref 78.0–100.0)
Platelets: 246 10*3/uL (ref 150–400)
RBC: 4.44 MIL/uL (ref 3.87–5.11)
RDW: 12.3 % (ref 11.5–15.5)
WBC: 6.3 10*3/uL (ref 4.0–10.5)

## 2017-05-27 LAB — URINALYSIS, ROUTINE W REFLEX MICROSCOPIC
Glucose, UA: NEGATIVE mg/dL
Hgb urine dipstick: NEGATIVE
KETONES UR: NEGATIVE mg/dL
Nitrite: NEGATIVE
PROTEIN: 30 mg/dL — AB
Specific Gravity, Urine: 1.027 (ref 1.005–1.030)
pH: 5 (ref 5.0–8.0)

## 2017-05-27 LAB — LIPASE, BLOOD: Lipase: 20 U/L (ref 11–51)

## 2017-05-27 LAB — POC URINE PREG, ED: PREG TEST UR: NEGATIVE

## 2017-05-27 MED ORDER — ALBUTEROL SULFATE (2.5 MG/3ML) 0.083% IN NEBU
5.0000 mg | INHALATION_SOLUTION | Freq: Once | RESPIRATORY_TRACT | Status: AC
  Administered 2017-05-27: 5 mg via RESPIRATORY_TRACT
  Filled 2017-05-27: qty 6

## 2017-05-27 MED ORDER — HYDROCODONE-ACETAMINOPHEN 5-325 MG PO TABS: 1.0000 | ORAL_TABLET | Freq: Four times a day (QID) | ORAL | 0 refills | Status: DC | PRN

## 2017-05-27 MED ORDER — PROMETHAZINE HCL 25 MG PO TABS: 25.0000 mg | ORAL_TABLET | Freq: Four times a day (QID) | ORAL | 0 refills | Status: DC | PRN

## 2017-05-27 MED ORDER — PREDNISONE 20 MG PO TABS: ORAL_TABLET | ORAL | 0 refills | Status: DC

## 2017-05-27 MED ORDER — IPRATROPIUM BROMIDE 0.02 % IN SOLN
0.5000 mg | Freq: Once | RESPIRATORY_TRACT | Status: AC
  Administered 2017-05-27: 0.5 mg via RESPIRATORY_TRACT
  Filled 2017-05-27: qty 2.5

## 2017-05-27 MED ORDER — PANTOPRAZOLE SODIUM 40 MG IV SOLR
40.0000 mg | Freq: Once | INTRAVENOUS | Status: AC
  Administered 2017-05-27: 40 mg via INTRAVENOUS
  Filled 2017-05-27: qty 40

## 2017-05-27 MED ORDER — ONDANSETRON HCL 4 MG/2ML IJ SOLN
4.0000 mg | Freq: Once | INTRAMUSCULAR | Status: AC
  Administered 2017-05-27: 4 mg via INTRAVENOUS
  Filled 2017-05-27: qty 2

## 2017-05-27 MED ORDER — MORPHINE SULFATE (PF) 4 MG/ML IV SOLN
4.0000 mg | Freq: Once | INTRAVENOUS | Status: AC
  Administered 2017-05-27: 4 mg via INTRAVENOUS
  Filled 2017-05-27: qty 1

## 2017-05-27 MED ORDER — SUCRALFATE 1 GM/10ML PO SUSP: 1.0000 g | Freq: Three times a day (TID) | ORAL | 0 refills | Status: DC

## 2017-05-27 MED ORDER — SODIUM CHLORIDE 0.9 % IV BOLUS (SEPSIS)
1000.0000 mL | Freq: Once | INTRAVENOUS | Status: AC
  Administered 2017-05-27: 1000 mL via INTRAVENOUS

## 2017-05-27 MED ORDER — ESOMEPRAZOLE MAGNESIUM 40 MG PO CPDR: 40.0000 mg | DELAYED_RELEASE_CAPSULE | Freq: Every day | ORAL | 0 refills | Status: DC

## 2017-05-27 MED ORDER — METHYLPREDNISOLONE SODIUM SUCC 125 MG IJ SOLR
125.0000 mg | Freq: Once | INTRAMUSCULAR | Status: AC
  Administered 2017-05-27: 125 mg via INTRAVENOUS
  Filled 2017-05-27: qty 2

## 2017-05-27 NOTE — ED Notes (Signed)
Gave patient Sprite.  Made Respiratory aware of neb treatment ordered.
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
## 2017-05-27 NOTE — ED Triage Notes (Signed)
Pt states that she has had a cough x 1 month (hx of COPD/smoker) and has pain upon inspiration in her back. Also states she has had N/V. Alert and oriented.

## 2017-05-27 NOTE — Discharge Instructions (Signed)
Take prednisone as prescribed.   Take nexium daily.   Take carafate as it will help your stomach.   Take phenergan for nausea.   Use albuterol as needed for cough and wheezing.   Take tylenol for pain. Take vicodin for severe pain. DO NOT drive with it.   See your GI doctor and primary care doctor  Return to ER if you have worse cough, wheezing, back pain, trouble breathing, vomiting, fever.

## 2017-05-29 ENCOUNTER — Other Ambulatory Visit: Payer: Self-pay | Admitting: *Deleted

## 2017-05-29 DIAGNOSIS — J449 Chronic obstructive pulmonary disease, unspecified: Secondary | ICD-10-CM

## 2017-05-29 MED ORDER — ALBUTEROL SULFATE HFA 108 (90 BASE) MCG/ACT IN AERS: INHALATION_SPRAY | RESPIRATORY_TRACT | 12 refills | Status: DC

## 2017-06-12 ENCOUNTER — Other Ambulatory Visit: Payer: Self-pay | Admitting: *Deleted

## 2017-06-12 DIAGNOSIS — J449 Chronic obstructive pulmonary disease, unspecified: Secondary | ICD-10-CM

## 2017-06-12 MED ORDER — ALBUTEROL SULFATE (2.5 MG/3ML) 0.083% IN NEBU: 2.5000 mg | INHALATION_SOLUTION | Freq: Four times a day (QID) | RESPIRATORY_TRACT | 0 refills | Status: DC | PRN

## 2017-06-28 ENCOUNTER — Other Ambulatory Visit: Payer: Self-pay | Admitting: *Deleted

## 2017-06-28 NOTE — Telephone Encounter (Signed)
Needs appt for any more refills

## 2017-08-14 ENCOUNTER — Encounter: Payer: Self-pay | Admitting: Student in an Organized Health Care Education/Training Program

## 2017-08-14 ENCOUNTER — Ambulatory Visit (INDEPENDENT_AMBULATORY_CARE_PROVIDER_SITE_OTHER): Payer: Medicaid Other | Admitting: Student in an Organized Health Care Education/Training Program

## 2017-08-14 VITALS — BP 118/88 | HR 60 | Temp 97.8°F | Ht 61.0 in | Wt 130.4 lb

## 2017-08-14 DIAGNOSIS — K5900 Constipation, unspecified: Secondary | ICD-10-CM | POA: Diagnosis not present

## 2017-08-14 DIAGNOSIS — Z23 Encounter for immunization: Secondary | ICD-10-CM | POA: Diagnosis not present

## 2017-08-14 DIAGNOSIS — J449 Chronic obstructive pulmonary disease, unspecified: Secondary | ICD-10-CM | POA: Diagnosis not present

## 2017-08-14 DIAGNOSIS — E0500 Thyrotoxicosis with diffuse goiter without thyrotoxic crisis or storm: Secondary | ICD-10-CM

## 2017-08-14 MED ORDER — POLYETHYLENE GLYCOL 3350 17 GM/SCOOP PO POWD: 17.0000 g | Freq: Every day | ORAL | 0 refills | Status: DC

## 2017-08-14 MED ORDER — ALBUTEROL SULFATE HFA 108 (90 BASE) MCG/ACT IN AERS: INHALATION_SPRAY | RESPIRATORY_TRACT | 12 refills | Status: DC

## 2017-08-14 MED ORDER — LEVOTHYROXINE SODIUM 137 MCG PO TABS: 137.0000 ug | ORAL_TABLET | Freq: Every day | ORAL | 11 refills | Status: DC

## 2017-08-14 MED ORDER — SUCRALFATE 1 GM/10ML PO SUSP: 1.0000 g | Freq: Three times a day (TID) | ORAL | 0 refills | Status: DC

## 2017-08-14 MED ORDER — ALBUTEROL SULFATE (2.5 MG/3ML) 0.083% IN NEBU: 2.5000 mg | INHALATION_SOLUTION | Freq: Four times a day (QID) | RESPIRATORY_TRACT | 0 refills | Status: DC | PRN
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
## 2017-08-14 NOTE — Patient Instructions (Signed)
It was a pleasure seeing you today in our clinic. Here is the treatment plan we have discussed and agreed upon together:  Please schedule follow up in 6 weeks for a thyroid recheck. If you develop heart palpitations or chest pain please call our clinic or seek medical attention immediately. Your medications were sent to your pharmacy.  Our clinic's number is [phone]. Please call with questions or concerns about what we discussed today.  Be well, Dr. Mosetta Putt

## 2017-08-16 DIAGNOSIS — K5900 Constipation, unspecified: Secondary | ICD-10-CM | POA: Insufficient documentation

## 2017-08-16 NOTE — Assessment & Plan Note (Signed)
9 current exacerbation. Refilled albuterol. Patient should return to discuss COPD in more detail. She agrees to make follow-up appointment.

## 2017-08-16 NOTE — Assessment & Plan Note (Signed)
Patient has been off of her Synthroid for 1-2 months. Previously had been maintained on 137 mcg daily for many years. Because the patient does not have history of heart disease it is safe to start again at this dose. However return precautions were provided, including cardiac symptoms. - Patient should follow-up in 6 weeks for TSH to monitor her disease - Patient is planning to follow-up with ophthalmology

## 2017-08-16 NOTE — Assessment & Plan Note (Signed)
MiraLAX provided. Instructions to titrate to 1-2 soft stools per day. May require prolonged course given the fact that she endorses constipation for several years.

## 2017-08-30 ENCOUNTER — Ambulatory Visit: Payer: Self-pay | Admitting: Student in an Organized Health Care Education/Training Program

## 2017-08-30 NOTE — Progress Notes (Deleted)
   CC: ***  HPI: [name] is a 46 y.o. female with PMH significant for COPD, hypothyroidism, graves disease who [facility] *** of *** duration.   Overdue health maintenance ***  Review of Symptoms:  See HPI for ROS.   CC, SH/smoking status, and VS noted.  Objective: LMP  (LMP Unknown)  GEN: NAD, alert, cooperative, and pleasant.*** EYE: no conjunctival injection, pupils equally round and reactive to light ENMT: normal tympanic light reflex, no nasal polyps,no rhinorrhea, no pharyngeal erythema or exudates NECK: full ROM, no thyromegaly RESPIRATORY: clear to auscultation bilaterally with no wheezes, rhonchi or rales, good effort CV: RRR, no m/r/g, no peripheral edema GI: soft, non-tender, non-distended, no hepatosplenomegaly SKIN: warm and dry, no rashes or lesions NEURO: II-XII grossly intact, normal gait, peripheral sensation intact PSYCH: AAOx3, appropriate affect  Flu Vaccine: *** Tdap Vaccine: *** - every 8230yrs - (<3 lifetime doses or unknown): all wounds -- look up need for Tetanus IG - (>=3 lifetime doses): clean/minor wound if >5630yrs from previous; all other wounds if >3867yrs from previous Zoster Vaccine: *** (those >50yo, once) Pneumonia Vaccine: *** (those w/ risk factors) - (<1881yr) Both: Immunocompromised, cochlear implant, CSF leak, asplenic, sickle cell, Chronic Renal Failure - (<5881yr) PPSV-23 only: Heart dz, lung disease, DM, tobacco abuse, alcoholism, cirrhosis/liver disease. - (>3481yr): PPSV13 then PPSV23 in 6-12mths;  - (>7581yr): repeat PPSV23 once if pt received prior to 45yo and 667yrs have passed  Assessment and plan:  No problem-specific Assessment & Plan notes found for this encounter.   No orders of the defined types were placed in this encounter.   No orders of the defined types were placed in this encounter.    Howard PouchLauren Analei Whinery, MD,MS,  PGY2 08/30/2017 8:06 AM

## 2017-09-09 ENCOUNTER — Ambulatory Visit: Payer: Self-pay | Admitting: Student in an Organized Health Care Education/Training Program
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
## 2017-09-16 ENCOUNTER — Ambulatory Visit (INDEPENDENT_AMBULATORY_CARE_PROVIDER_SITE_OTHER): Payer: Medicaid Other | Admitting: Student

## 2017-09-16 VITALS — BP 130/90 | HR 65 | Temp 97.9°F | Ht 60.0 in | Wt 125.6 lb

## 2017-09-16 DIAGNOSIS — J441 Chronic obstructive pulmonary disease with (acute) exacerbation: Secondary | ICD-10-CM

## 2017-09-16 DIAGNOSIS — G44209 Tension-type headache, unspecified, not intractable: Secondary | ICD-10-CM

## 2017-09-16 DIAGNOSIS — Z8719 Personal history of other diseases of the digestive system: Secondary | ICD-10-CM | POA: Diagnosis not present

## 2017-09-16 DIAGNOSIS — Z8711 Personal history of peptic ulcer disease: Secondary | ICD-10-CM

## 2017-09-16 DIAGNOSIS — F17200 Nicotine dependence, unspecified, uncomplicated: Secondary | ICD-10-CM | POA: Diagnosis not present

## 2017-09-16 MED ORDER — PREDNISONE 50 MG PO TABS: 50.0000 mg | ORAL_TABLET | Freq: Every day | ORAL | 0 refills | Status: DC

## 2017-09-16 MED ORDER — NICOTINE 21 MG/24HR TD PT24: 21.0000 mg | MEDICATED_PATCH | Freq: Every day | TRANSDERMAL | 0 refills | Status: DC

## 2017-09-16 MED ORDER — GUAIFENESIN ER 600 MG PO TB12: 600.0000 mg | ORAL_TABLET | Freq: Two times a day (BID) | ORAL | 0 refills | Status: DC

## 2017-09-16 MED ORDER — SUCRALFATE 1 GM/10ML PO SUSP: 1.0000 g | Freq: Three times a day (TID) | ORAL | 0 refills | Status: DC

## 2017-09-16 MED ORDER — LEVOFLOXACIN 750 MG PO TABS: 750.0000 mg | ORAL_TABLET | Freq: Every day | ORAL | 0 refills | Status: DC

## 2017-09-16 MED ORDER — TIOTROPIUM BROMIDE MONOHYDRATE 18 MCG IN CAPS: 18.0000 ug | ORAL_CAPSULE | Freq: Every day | RESPIRATORY_TRACT | 12 refills | Status: DC
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
## 2017-09-16 NOTE — Patient Instructions (Signed)
It was great seeing you today! We have addressed the following issues today  Cough: This is likely due to his COPD exacerbation.  We sent a prescription for steroid, antibiotic, Spiriva and Mucinex to pharmacy.  Please pick up this prescription and start taking.  Please follow-up with your primary care doctor to discuss about COPD more.   Tobacco use: I am glad he thought about quitting smoking.  We sent a prescription for nicotine patch to your pharmacy.  You can also Call 1800-QUIT-NOW for help with stopping smoking.  Stomach ulcer: We have refilled your carafate today.  Headache: We gave you an injection for your headache.  I also recommend adequate hydration with water or Gatorade.   If we did any lab work today, and the results require attention, either me or my nurse will get in touch with you. If everything is normal, you will get a letter in mail and a message via . If you don't hear from us in two weeks, please give us a call. Otherwise, we look forward to seeing you again at your next visit. If you have any questions or concerns before then, please call the clinic at [phone].  Please bring all your medications to every doctors visit  Sign up for My Chart to have easy access to your labs results, and communication with your Primary care physician.    Please check-out at the front desk before leaving the clinic.    Take Care,   Dr. Alanda SlimGonfa

## 2017-09-17 ENCOUNTER — Ambulatory Visit: Payer: Medicaid Other | Admitting: Student in an Organized Health Care Education/Training Program

## 2017-09-17 MED ORDER — KETOROLAC TROMETHAMINE 30 MG/ML IJ SOLN
30.0000 mg | Freq: Once | INTRAMUSCULAR | Status: AC
  Administered 2017-09-16: 30 mg via INTRAMUSCULAR

## 2017-09-17 NOTE — Progress Notes (Deleted)
   CC: ***  HPI: [name] is a 46 y.o. female with PMH significant for COPD, Graves, hypothyroid*** [facility] *** of *** duration.   Overdue health maintenance ***  Review of Symptoms:  See HPI for ROS.   CC, SH/smoking status, and VS noted.  Objective: LMP  (LMP Unknown)  GEN: NAD, alert, cooperative, and pleasant.*** EYE: no conjunctival injection, pupils equally round and reactive to light ENMT: normal tympanic light reflex, no nasal polyps,no rhinorrhea, no pharyngeal erythema or exudates NECK: full ROM, no thyromegaly RESPIRATORY: clear to auscultation bilaterally with no wheezes, rhonchi or rales, good effort CV: RRR, no m/r/g, no peripheral edema GI: soft, non-tender, non-distended, no hepatosplenomegaly SKIN: warm and dry, no rashes or lesions NEURO: II-XII grossly intact, normal gait, peripheral sensation intact PSYCH: AAOx3, appropriate affect  Flu Vaccine: *** Tdap Vaccine: *** - every 3082yrs - (<3 lifetime doses or unknown): all wounds -- look up need for Tetanus IG - (>=3 lifetime doses): clean/minor wound if >5382yrs from previous; all other wounds if >142yrs from previous Zoster Vaccine: *** (those >50yo, once) Pneumonia Vaccine: *** (those w/ risk factors) - (<4343yr) Both: Immunocompromised, cochlear implant, CSF leak, asplenic, sickle cell, Chronic Renal Failure - (<3243yr) PPSV-23 only: Heart dz, lung disease, DM, tobacco abuse, alcoholism, cirrhosis/liver disease. - (>7043yr): PPSV13 then PPSV23 in 6-12mths;  - (>5543yr): repeat PPSV23 once if pt received prior to 45yo and 5742yrs have passed  Assessment and plan:  No problem-specific Assessment & Plan notes found for this encounter.   No orders of the defined types were placed in this encounter.   No orders of the defined types were placed in this encounter.    Melinda PouchLauren Melinda Whitlatch, MD,MS,  PGY2 09/17/2017 5:45 AM

## 2017-09-17 NOTE — Addendum Note (Signed)
Addended by: Georges LynchSAUNDERS, Jael Kostick T on: 09/17/2017 08:37 AM   Modules accepted: Orders

## 2017-09-25 ENCOUNTER — Ambulatory Visit: Payer: Self-pay | Admitting: Student in an Organized Health Care Education/Training Program

## 2017-09-25 NOTE — Progress Notes (Deleted)
   CC: ***  HPI: [name] is a 46 y.o. female with PMH significant for *** [facility] *** [facility] *** duration.   Seen 9 days ago and treated for COPD exacerbation with 5 day course of prednisone and Levaquin, also Spiriva.   Overdue health maintenance ***  Review of Symptoms:  See HPI for ROS.   CC, SH/smoking status, and VS noted.  Objective: LMP  (LMP Unknown)  GEN: NAD, alert, cooperative, and pleasant.*** EYE: no conjunctival injection, pupils equally round and reactive to light ENMT: normal tympanic light reflex, no nasal polyps,no rhinorrhea, no pharyngeal erythema or exudates NECK: full ROM, no thyromegaly RESPIRATORY: clear to auscultation bilaterally with no wheezes, rhonchi or rales, good effort CV: RRR, no m/r/g, no peripheral edema GI: soft, non-tender, non-distended, no hepatosplenomegaly SKIN: warm and dry, no rashes or lesions NEURO: II-XII grossly intact, normal gait, peripheral sensation intact PSYCH: AAOx3, appropriate affect  Flu Vaccine: *** Tdap Vaccine: *** - every 634yrs - (<3 lifetime doses or unknown): all wounds -- look up need for Tetanus IG - (>=3 lifetime doses): clean/minor wound if >364yrs from previous; all other wounds if >3864yrs from previous Zoster Vaccine: *** (those >50yo, once) Pneumonia Vaccine: *** (those w/ risk factors) - (<7331yr) Both: Immunocompromised, cochlear implant, CSF leak, asplenic, sickle cell, Chronic Renal Failure - (<3231yr) PPSV-23 only: Heart dz, lung disease, DM, tobacco abuse, alcoholism, cirrhosis/liver disease. - (>831yr): PPSV13 then PPSV23 in 6-12mths;  - (>4231yr): repeat PPSV23 once if pt received prior to 45yo and 4564yrs have passed  Assessment and plan:  No problem-specific Assessment & Plan notes found for this encounter.   No orders of the defined types were placed in this encounter.   No orders of the defined types were placed in this encounter.    Howard PouchLauren Nelvin Tomb, MD,MS,  PGY2 09/25/2017  8:13 AM

## 2017-10-11 ENCOUNTER — Other Ambulatory Visit: Payer: Self-pay | Admitting: Student in an Organized Health Care Education/Training Program

## 2017-10-11 DIAGNOSIS — J449 Chronic obstructive pulmonary disease, unspecified: Secondary | ICD-10-CM

## 2017-10-15 ENCOUNTER — Other Ambulatory Visit: Payer: Self-pay | Admitting: Family Medicine

## 2017-10-15 DIAGNOSIS — J441 Chronic obstructive pulmonary disease with (acute) exacerbation: Secondary | ICD-10-CM

## 2017-10-15 NOTE — Progress Notes (Signed)
Pt called nurse line.   [facility] in [facility].   Dr. McDiarmid wrote Rx and [facility] [phone]) Pt informed.  Elyana Grabski, Maryjo RochesterJessica Dawn, CMA

## 2017-10-15 NOTE — Progress Notes (Signed)
Rx for new nebulizer device for her COPD

## 2017-10-25 ENCOUNTER — Other Ambulatory Visit: Payer: Self-pay | Admitting: Neurosurgery

## 2017-10-25 DIAGNOSIS — S12590A Other displaced fracture of sixth cervical vertebra, initial encounter for closed fracture: Secondary | ICD-10-CM

## 2017-11-17 ENCOUNTER — Ambulatory Visit
Admission: RE | Admit: 2017-11-17 | Discharge: 2017-11-17 | Disposition: A | Discharge status: Home / Self Care | Payer: Medicaid Other | Source: Ambulatory Visit | Attending: Neurosurgery | Admitting: Neurosurgery

## 2017-11-17 DIAGNOSIS — S12590A Other displaced fracture of sixth cervical vertebra, initial encounter for closed fracture: Secondary | ICD-10-CM
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
## 2018-03-07 ENCOUNTER — Encounter (HOSPITAL_COMMUNITY): Payer: Self-pay | Admitting: Family Medicine

## 2018-03-07 ENCOUNTER — Ambulatory Visit (INDEPENDENT_AMBULATORY_CARE_PROVIDER_SITE_OTHER): Payer: Medicaid Other

## 2018-03-07 ENCOUNTER — Ambulatory Visit (HOSPITAL_COMMUNITY)
Admission: EM | Admit: 2018-03-07 | Discharge: 2018-03-07 | Disposition: A | Discharge status: Home / Self Care | Payer: Medicaid Other | Attending: Family Medicine | Admitting: Family Medicine

## 2018-03-07 DIAGNOSIS — J441 Chronic obstructive pulmonary disease with (acute) exacerbation: Secondary | ICD-10-CM | POA: Diagnosis not present

## 2018-03-07 DIAGNOSIS — R05 Cough: Secondary | ICD-10-CM | POA: Diagnosis not present

## 2018-03-07 DIAGNOSIS — R059 Cough, unspecified: Secondary | ICD-10-CM

## 2018-03-07 DIAGNOSIS — F321 Major depressive disorder, single episode, moderate: Secondary | ICD-10-CM

## 2018-03-07 MED ORDER — PREDNISONE 10 MG PO TABS: 20.0000 mg | ORAL_TABLET | Freq: Two times a day (BID) | ORAL | 0 refills | Status: AC

## 2018-03-07 MED ORDER — IPRATROPIUM-ALBUTEROL 0.5-2.5 (3) MG/3ML IN SOLN
3.0000 mL | Freq: Once | RESPIRATORY_TRACT | Status: AC
  Administered 2018-03-07: 3 mL via RESPIRATORY_TRACT

## 2018-03-07 MED ORDER — KETOROLAC TROMETHAMINE 60 MG/2ML IM SOLN
60.0000 mg | Freq: Once | INTRAMUSCULAR | Status: AC
  Administered 2018-03-07: 60 mg via INTRAMUSCULAR

## 2018-03-07 MED ORDER — AZITHROMYCIN 250 MG PO TABS: 250.0000 mg | ORAL_TABLET | Freq: Every day | ORAL | 0 refills | Status: DC

## 2018-03-07 MED ORDER — BENZONATATE 100 MG PO CAPS: 100.0000 mg | ORAL_CAPSULE | Freq: Three times a day (TID) | ORAL | 0 refills | Status: AC | PRN

## 2018-03-07 MED ORDER — IPRATROPIUM-ALBUTEROL 0.5-2.5 (3) MG/3ML IN SOLN
RESPIRATORY_TRACT | Status: AC
Start: 2018-03-07 — End: ?
  Filled 2018-03-07: qty 3

## 2018-03-07 MED ORDER — BENZOCAINE 20 % MT AERO
INHALATION_SPRAY | OROMUCOSAL | Status: AC
  Filled 2018-03-07: qty 57

## 2018-03-07 MED ORDER — KETOROLAC TROMETHAMINE 60 MG/2ML IM SOLN
INTRAMUSCULAR | Status: AC
  Filled 2018-03-07: qty 2
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
## 2018-03-07 NOTE — Discharge Instructions (Addendum)
Given toradol in office for pain management Given duo-neb treatment in office for symptomatic relief Prescribed prednisone take as prescribed and to completion Prescribed azithromycin take as prescribed and to completion Prescribed tessalon for cough as needed Continue proair inhaler at home as needed for symptomatic relief Push fluids and get plenty of rest Follow up with PCP if symptoms persists Return or go to ER if you have new or worsening symptoms such as difficulty breathing, shortness of breath, unable to tolerate own secretions, unable to speak in full sentences, chest pain, abdominal pain, etc...  Follow up with psychiatrist as soon as possible Take zoloft and other psychiatric medications as prescribed

## 2018-03-07 NOTE — ED Triage Notes (Signed)
Pt here for cough, runny nose, fever, fatigue and body aches.

## 2018-03-28 ENCOUNTER — Other Ambulatory Visit: Payer: Self-pay | Admitting: Family Medicine

## 2018-04-17 ENCOUNTER — Emergency Department (HOSPITAL_COMMUNITY)
Admission: EM | Admit: 2018-04-17 | Discharge: 2018-04-18 | Disposition: A | Discharge status: Home / Self Care | Payer: Medicaid Other | Attending: Emergency Medicine | Admitting: Emergency Medicine

## 2018-04-17 ENCOUNTER — Encounter (HOSPITAL_COMMUNITY): Payer: Self-pay | Admitting: Emergency Medicine

## 2018-04-17 ENCOUNTER — Other Ambulatory Visit: Payer: Self-pay

## 2018-04-17 DIAGNOSIS — F17210 Nicotine dependence, cigarettes, uncomplicated: Secondary | ICD-10-CM | POA: Diagnosis not present

## 2018-04-17 DIAGNOSIS — K2100 Gastro-esophageal reflux disease with esophagitis, without bleeding: Secondary | ICD-10-CM

## 2018-04-17 DIAGNOSIS — J449 Chronic obstructive pulmonary disease, unspecified: Secondary | ICD-10-CM | POA: Insufficient documentation

## 2018-04-17 DIAGNOSIS — R1013 Epigastric pain: Secondary | ICD-10-CM | POA: Diagnosis not present

## 2018-04-17 DIAGNOSIS — Z8719 Personal history of other diseases of the digestive system: Secondary | ICD-10-CM

## 2018-04-17 DIAGNOSIS — R0789 Other chest pain: Secondary | ICD-10-CM | POA: Diagnosis not present

## 2018-04-17 DIAGNOSIS — Z8711 Personal history of peptic ulcer disease: Secondary | ICD-10-CM

## 2018-04-17 DIAGNOSIS — R070 Pain in throat: Secondary | ICD-10-CM | POA: Diagnosis present
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
## 2018-04-18 ENCOUNTER — Encounter (HOSPITAL_COMMUNITY): Payer: Self-pay | Admitting: Emergency Medicine

## 2018-04-18 MED ORDER — SUCRALFATE 1 GM/10ML PO SUSP
1.0000 g | Freq: Three times a day (TID) | ORAL | Status: DC
  Administered 2018-04-18: 1 g via ORAL
  Filled 2018-04-18: qty 10

## 2018-04-18 MED ORDER — PANTOPRAZOLE SODIUM 40 MG PO TBEC
40.0000 mg | DELAYED_RELEASE_TABLET | Freq: Once | ORAL | Status: AC
Start: 2018-04-18 — End: 2018-04-18
  Administered 2018-04-18: 40 mg via ORAL
  Filled 2018-04-18: qty 1

## 2018-04-18 MED ORDER — ESOMEPRAZOLE MAGNESIUM 40 MG PO CPDR: DELAYED_RELEASE_CAPSULE | ORAL | 0 refills | Status: DC

## 2018-04-18 MED ORDER — FAMOTIDINE 20 MG PO TABS
40.0000 mg | ORAL_TABLET | Freq: Once | ORAL | Status: AC
Start: 2018-04-18 — End: 2018-04-18
  Administered 2018-04-18: 40 mg via ORAL
  Filled 2018-04-18: qty 2

## 2018-04-18 MED ORDER — SUCRALFATE 1 GM/10ML PO SUSP: 1.0000 g | Freq: Three times a day (TID) | ORAL | 1 refills | Status: DC
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
## 2018-04-29 ENCOUNTER — Ambulatory Visit: Payer: Medicaid Other | Admitting: Internal Medicine

## 2018-05-02 ENCOUNTER — Emergency Department (HOSPITAL_COMMUNITY): Payer: Medicaid Other

## 2018-05-02 ENCOUNTER — Other Ambulatory Visit: Payer: Self-pay

## 2018-05-02 ENCOUNTER — Emergency Department (HOSPITAL_COMMUNITY)
Admission: EM | Admit: 2018-05-02 | Discharge: 2018-05-03 | Disposition: A | Discharge status: Home / Self Care | Payer: Medicaid Other | Attending: Emergency Medicine | Admitting: Emergency Medicine

## 2018-05-02 ENCOUNTER — Encounter (HOSPITAL_COMMUNITY): Payer: Self-pay | Admitting: Emergency Medicine

## 2018-05-02 DIAGNOSIS — Y9389 Activity, other specified: Secondary | ICD-10-CM | POA: Insufficient documentation

## 2018-05-02 DIAGNOSIS — E039 Hypothyroidism, unspecified: Secondary | ICD-10-CM | POA: Insufficient documentation

## 2018-05-02 DIAGNOSIS — J45909 Unspecified asthma, uncomplicated: Secondary | ICD-10-CM | POA: Insufficient documentation

## 2018-05-02 DIAGNOSIS — F17210 Nicotine dependence, cigarettes, uncomplicated: Secondary | ICD-10-CM | POA: Diagnosis not present

## 2018-05-02 DIAGNOSIS — R4781 Slurred speech: Secondary | ICD-10-CM | POA: Insufficient documentation

## 2018-05-02 DIAGNOSIS — Y929 Unspecified place or not applicable: Secondary | ICD-10-CM | POA: Insufficient documentation

## 2018-05-02 DIAGNOSIS — S52322A Displaced transverse fracture of shaft of left radius, initial encounter for closed fracture: Secondary | ICD-10-CM | POA: Insufficient documentation

## 2018-05-02 DIAGNOSIS — S59912A Unspecified injury of left forearm, initial encounter: Secondary | ICD-10-CM | POA: Diagnosis present

## 2018-05-02 DIAGNOSIS — Y998 Other external cause status: Secondary | ICD-10-CM | POA: Insufficient documentation

## 2018-05-02 DIAGNOSIS — J449 Chronic obstructive pulmonary disease, unspecified: Secondary | ICD-10-CM | POA: Insufficient documentation

## 2018-05-02 DIAGNOSIS — Z79899 Other long term (current) drug therapy: Secondary | ICD-10-CM | POA: Insufficient documentation

## 2018-05-02 LAB — CBC WITH DIFFERENTIAL/PLATELET
Abs Immature Granulocytes: 0 10*3/uL (ref 0.0–0.1)
Basophils Absolute: 0.1 10*3/uL (ref 0.0–0.1)
Basophils Relative: 1 %
EOS ABS: 0.1 10*3/uL (ref 0.0–0.7)
EOS PCT: 2 %
HEMATOCRIT: 43.8 % (ref 36.0–46.0)
Hemoglobin: 13.8 g/dL (ref 12.0–15.0)
Immature Granulocytes: 0 %
LYMPHS ABS: 2.1 10*3/uL (ref 0.7–4.0)
Lymphocytes Relative: 28 %
MCH: 29.9 pg (ref 26.0–34.0)
MCHC: 31.5 g/dL (ref 30.0–36.0)
MCV: 94.8 fL (ref 78.0–100.0)
Monocytes Absolute: 0.6 10*3/uL (ref 0.1–1.0)
Monocytes Relative: 7 %
Neutro Abs: 4.7 10*3/uL (ref 1.7–7.7)
Neutrophils Relative %: 62 %
Platelets: 233 10*3/uL (ref 150–400)
RBC: 4.62 MIL/uL (ref 3.87–5.11)
RDW: 12.5 % (ref 11.5–15.5)
WBC: 7.6 10*3/uL (ref 4.0–10.5)

## 2018-05-02 LAB — BASIC METABOLIC PANEL
Anion gap: 7 (ref 5–15)
BUN: 12 mg/dL (ref 6–20)
CALCIUM: 9.2 mg/dL (ref 8.9–10.3)
CO2: 28 mmol/L (ref 22–32)
CREATININE: 0.78 mg/dL (ref 0.44–1.00)
Chloride: 104 mmol/L (ref 101–111)
GFR calc non Af Amer: >60 mL/min (ref >60)
GLUCOSE: 98 mg/dL (ref 65–99)
Potassium: 3.9 mmol/L (ref 3.5–5.1)
Sodium: 139 mmol/L (ref 135–145)

## 2018-05-02 LAB — ETHANOL: Alcohol, Ethyl (B): <10 mg/dL (ref <10)
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
## 2018-05-03 ENCOUNTER — Emergency Department (HOSPITAL_COMMUNITY): Payer: Medicaid Other

## 2018-05-03 MED ORDER — MORPHINE SULFATE (PF) 4 MG/ML IV SOLN
4.0000 mg | Freq: Once | INTRAVENOUS | Status: AC
  Administered 2018-05-03: 4 mg via INTRAVENOUS
  Filled 2018-05-03: qty 1

## 2018-05-03 MED ORDER — OXYCODONE-ACETAMINOPHEN 5-325 MG PO TABS: 1.0000 | ORAL_TABLET | ORAL | 0 refills | Status: DC | PRN

## 2018-05-03 MED ORDER — IBUPROFEN 600 MG PO TABS: 600.0000 mg | ORAL_TABLET | Freq: Four times a day (QID) | ORAL | 0 refills | Status: DC | PRN

## 2018-05-05 ENCOUNTER — Encounter: Payer: Self-pay | Admitting: Neurology

## 2018-05-05 ENCOUNTER — Encounter (HOSPITAL_COMMUNITY): Payer: Self-pay | Admitting: *Deleted

## 2018-05-05 ENCOUNTER — Other Ambulatory Visit: Payer: Self-pay | Admitting: Orthopedic Surgery

## 2018-05-05 ENCOUNTER — Other Ambulatory Visit: Payer: Self-pay

## 2018-05-05 NOTE — Progress Notes (Signed)
Spoke with pt for pre-op call. Pt denies cardiac history, HTN or Diabetes. 

## 2018-05-06 ENCOUNTER — Ambulatory Visit (HOSPITAL_COMMUNITY): Payer: Medicaid Other | Admitting: Anesthesiology

## 2018-05-06 ENCOUNTER — Encounter (HOSPITAL_COMMUNITY): Payer: Self-pay | Admitting: *Deleted

## 2018-05-06 ENCOUNTER — Ambulatory Visit (HOSPITAL_COMMUNITY)
Admission: RE | Admit: 2018-05-06 | Discharge: 2018-05-06 | Disposition: A | Discharge status: Home / Self Care | Payer: Medicaid Other | Source: Ambulatory Visit | Attending: Orthopedic Surgery | Admitting: Orthopedic Surgery

## 2018-05-06 ENCOUNTER — Encounter (HOSPITAL_COMMUNITY)
Admission: RE | Disposition: A | Discharge status: Home / Self Care | Payer: Self-pay | Source: Ambulatory Visit | Attending: Orthopedic Surgery

## 2018-05-06 DIAGNOSIS — F17210 Nicotine dependence, cigarettes, uncomplicated: Secondary | ICD-10-CM | POA: Insufficient documentation

## 2018-05-06 DIAGNOSIS — S52392A Other fracture of shaft of radius, left arm, initial encounter for closed fracture: Secondary | ICD-10-CM | POA: Diagnosis present

## 2018-05-06 DIAGNOSIS — E039 Hypothyroidism, unspecified: Secondary | ICD-10-CM | POA: Diagnosis not present

## 2018-05-06 DIAGNOSIS — K219 Gastro-esophageal reflux disease without esophagitis: Secondary | ICD-10-CM | POA: Diagnosis not present

## 2018-05-06 DIAGNOSIS — Z885 Allergy status to narcotic agent status: Secondary | ICD-10-CM | POA: Diagnosis not present

## 2018-05-06 DIAGNOSIS — J449 Chronic obstructive pulmonary disease, unspecified: Secondary | ICD-10-CM | POA: Diagnosis not present

## 2018-05-06 DIAGNOSIS — F319 Bipolar disorder, unspecified: Secondary | ICD-10-CM | POA: Insufficient documentation

## 2018-05-06 DIAGNOSIS — F419 Anxiety disorder, unspecified: Secondary | ICD-10-CM | POA: Insufficient documentation

## 2018-05-06 DIAGNOSIS — Z8711 Personal history of peptic ulcer disease: Secondary | ICD-10-CM | POA: Insufficient documentation

## 2018-05-06 DIAGNOSIS — Z79899 Other long term (current) drug therapy: Secondary | ICD-10-CM | POA: Diagnosis not present

## 2018-05-06 HISTORY — PX: ORIF RADIAL FRACTURE: SHX5113

## 2018-05-06 SURGERY — OPEN REDUCTION INTERNAL FIXATION (ORIF) RADIAL FRACTURE
Anesthesia: Monitor Anesthesia Care | Site: Arm Lower | Laterality: Left

## 2018-05-06 SURGERY — Surgical Case
Anesthesia: *Unknown

## 2018-05-06 MED ORDER — CHLORHEXIDINE GLUCONATE 4 % EX LIQD: 60.0000 mL | Freq: Once | CUTANEOUS | Status: DC

## 2018-05-06 MED ORDER — MIDAZOLAM HCL 2 MG/2ML IJ SOLN
INTRAMUSCULAR | Status: AC
  Administered 2018-05-06: 2 mg via INTRAVENOUS
  Filled 2018-05-06: qty 2

## 2018-05-06 MED ORDER — FENTANYL CITRATE (PF) 100 MCG/2ML IJ SOLN
INTRAMUSCULAR | Status: AC
  Administered 2018-05-06: 100 ug via INTRAVENOUS
  Filled 2018-05-06: qty 2

## 2018-05-06 MED ORDER — FENTANYL CITRATE (PF) 100 MCG/2ML IJ SOLN
INTRAMUSCULAR | Status: AC
  Filled 2018-05-06: qty 2

## 2018-05-06 MED ORDER — OXYCODONE HCL 5 MG PO TABS
5.0000 mg | ORAL_TABLET | Freq: Once | ORAL | Status: DC | PRN
Start: 2018-05-06 — End: 2018-05-06

## 2018-05-06 MED ORDER — PROPOFOL 1000 MG/100ML IV EMUL
INTRAVENOUS | Status: AC
  Filled 2018-05-06: qty 100

## 2018-05-06 MED ORDER — OXYCODONE HCL 5 MG/5ML PO SOLN: 5.0000 mg | Freq: Once | ORAL | Status: DC | PRN

## 2018-05-06 MED ORDER — PROPOFOL 500 MG/50ML IV EMUL
INTRAVENOUS | Status: DC | PRN
  Administered 2018-05-06: 18:00:00 via INTRAVENOUS
  Administered 2018-05-06: 200 ug/kg/min via INTRAVENOUS

## 2018-05-06 MED ORDER — PROPOFOL 10 MG/ML IV BOLUS
INTRAVENOUS | Status: DC | PRN
  Administered 2018-05-06 (×2): 25 mg via INTRAVENOUS
  Administered 2018-05-06: 15 mg via INTRAVENOUS

## 2018-05-06 MED ORDER — CEFAZOLIN SODIUM-DEXTROSE 2-4 GM/100ML-% IV SOLN
INTRAVENOUS | Status: AC
  Filled 2018-05-06: qty 100

## 2018-05-06 MED ORDER — PROPOFOL 10 MG/ML IV BOLUS
INTRAVENOUS | Status: AC
  Filled 2018-05-06: qty 20

## 2018-05-06 MED ORDER — LACTATED RINGERS IV SOLN
INTRAVENOUS | Status: DC
  Administered 2018-05-06: 16:00:00 via INTRAVENOUS

## 2018-05-06 MED ORDER — DEXMEDETOMIDINE HCL IN NACL 200 MCG/50ML IV SOLN
INTRAVENOUS | Status: DC | PRN
  Administered 2018-05-06: 16 ug via INTRAVENOUS

## 2018-05-06 MED ORDER — MIDAZOLAM HCL 5 MG/5ML IJ SOLN
INTRAMUSCULAR | Status: DC | PRN
  Administered 2018-05-06: 2 mg via INTRAVENOUS

## 2018-05-06 MED ORDER — MIDAZOLAM HCL 2 MG/2ML IJ SOLN
2.0000 mg | Freq: Once | INTRAMUSCULAR | Status: AC
  Administered 2018-05-06: 2 mg via INTRAVENOUS

## 2018-05-06 MED ORDER — ONDANSETRON HCL 4 MG/2ML IJ SOLN
INTRAMUSCULAR | Status: DC | PRN
  Administered 2018-05-06: 4 mg via INTRAVENOUS

## 2018-05-06 MED ORDER — BUPIVACAINE-EPINEPHRINE (PF) 0.5% -1:200000 IJ SOLN
INTRAMUSCULAR | Status: DC | PRN
  Administered 2018-05-06: 25 mL via PERINEURAL

## 2018-05-06 MED ORDER — FENTANYL CITRATE (PF) 250 MCG/5ML IJ SOLN
INTRAMUSCULAR | Status: AC
  Filled 2018-05-06: qty 5

## 2018-05-06 MED ORDER — FENTANYL CITRATE (PF) 100 MCG/2ML IJ SOLN
INTRAMUSCULAR | Status: DC | PRN
  Administered 2018-05-06: 50 ug via INTRAVENOUS

## 2018-05-06 MED ORDER — MIDAZOLAM HCL 2 MG/2ML IJ SOLN
INTRAMUSCULAR | Status: AC
  Filled 2018-05-06: qty 2

## 2018-05-06 MED ORDER — 0.9 % SODIUM CHLORIDE (POUR BTL) OPTIME
TOPICAL | Status: DC | PRN
  Administered 2018-05-06: 1000 mL

## 2018-05-06 MED ORDER — FENTANYL CITRATE (PF) 100 MCG/2ML IJ SOLN
100.0000 ug | Freq: Once | INTRAMUSCULAR | Status: AC
  Administered 2018-05-06: 100 ug via INTRAVENOUS

## 2018-05-06 MED ORDER — CEFAZOLIN SODIUM-DEXTROSE 2-4 GM/100ML-% IV SOLN
2.0000 g | INTRAVENOUS | Status: AC
  Administered 2018-05-06: 2 g via INTRAVENOUS

## 2018-05-06 MED ORDER — FENTANYL CITRATE (PF) 100 MCG/2ML IJ SOLN
25.0000 ug | INTRAMUSCULAR | Status: DC | PRN
  Administered 2018-05-06: 50 ug via INTRAVENOUS

## 2018-05-06 SURGICAL SUPPLY — 64 items
BANDAGE ACE 3X5.8 VEL STRL LF (GAUZE/BANDAGES/DRESSINGS) ×4 IMPLANT
BANDAGE ACE 4X5 VEL STRL LF (GAUZE/BANDAGES/DRESSINGS) ×1 IMPLANT
BANDAGE ELASTIC 3 VELCRO ST LF (GAUZE/BANDAGES/DRESSINGS) ×3 IMPLANT
BANDAGE ELASTIC 4 VELCRO ST LF (GAUZE/BANDAGES/DRESSINGS) ×3 IMPLANT
BIT DRILL 2.8X5 QR DISP (BIT) ×3 IMPLANT
BLADE CLIPPER SURG (BLADE) IMPLANT
BNDG CMPR 9X4 STRL LF SNTH (GAUZE/BANDAGES/DRESSINGS) ×2
BNDG ESMARK 4X9 LF (GAUZE/BANDAGES/DRESSINGS) ×4 IMPLANT
BNDG GAUZE ELAST 4 BULKY (GAUZE/BANDAGES/DRESSINGS) ×4 IMPLANT
CORDS BIPOLAR (ELECTRODE) ×4 IMPLANT
COVER SURGICAL LIGHT HANDLE (MISCELLANEOUS) ×4 IMPLANT
CUFF TOURNIQUET SINGLE 18IN (TOURNIQUET CUFF) ×4 IMPLANT
CUFF TOURNIQUET SINGLE 24IN (TOURNIQUET CUFF) IMPLANT
DECANTER SPIKE VIAL GLASS SM (MISCELLANEOUS) IMPLANT
DRAIN TLS ROUND 10FR (DRAIN) IMPLANT
DRAPE OEC MINIVIEW 54X84 (DRAPES) IMPLANT
DRAPE SURG 17X23 STRL (DRAPES) ×4 IMPLANT
GAUZE SPONGE 4X4 12PLY STRL (GAUZE/BANDAGES/DRESSINGS) ×4 IMPLANT
GAUZE SPONGE 4X4 12PLY STRL LF (GAUZE/BANDAGES/DRESSINGS) ×3 IMPLANT
GAUZE XEROFORM 1X8 LF (GAUZE/BANDAGES/DRESSINGS) ×4 IMPLANT
GAUZE XEROFORM 5X9 LF (GAUZE/BANDAGES/DRESSINGS) ×3 IMPLANT
GLOVE BIO SURGEON STRL SZ7.5 (GLOVE) ×4 IMPLANT
GLOVE BIOGEL PI IND STRL 8 (GLOVE) ×2 IMPLANT
GLOVE BIOGEL PI IND STRL 8.5 (GLOVE) ×1 IMPLANT
GLOVE BIOGEL PI INDICATOR 8 (GLOVE) ×2
GLOVE BIOGEL PI INDICATOR 8.5 (GLOVE) ×2
GLOVE SURG ORTHO 8.0 STRL STRW (GLOVE) ×3 IMPLANT
GOWN STRL REUS W/ TWL LRG LVL3 (GOWN DISPOSABLE) ×4 IMPLANT
GOWN STRL REUS W/ TWL XL LVL3 (GOWN DISPOSABLE) ×6 IMPLANT
GOWN STRL REUS W/TWL LRG LVL3 (GOWN DISPOSABLE) ×4
GOWN STRL REUS W/TWL XL LVL3 (GOWN DISPOSABLE) ×12
GUIDEWIRE ORTHO MINI ACTK .045 (WIRE) ×6 IMPLANT
KIT BASIN OR (CUSTOM PROCEDURE TRAY) ×4 IMPLANT
KIT TURNOVER KIT B (KITS) ×4 IMPLANT
LOOP VESSEL MAXI BLUE (MISCELLANEOUS) IMPLANT
MANIFOLD NEPTUNE II (INSTRUMENTS) ×4 IMPLANT
NEEDLE 22X1 1/2 (OR ONLY) (NEEDLE) IMPLANT
NS IRRIG 1000ML POUR BTL (IV SOLUTION) ×4 IMPLANT
PACK ORTHO EXTREMITY (CUSTOM PROCEDURE TRAY) ×4 IMPLANT
PAD ARMBOARD 7.5X6 YLW CONV (MISCELLANEOUS) ×8 IMPLANT
PAD CAST 3X4 CTTN HI CHSV (CAST SUPPLIES) ×1 IMPLANT
PAD CAST 4YDX4 CTTN HI CHSV (CAST SUPPLIES) ×2 IMPLANT
PADDING CAST COTTON 3X4 STRL (CAST SUPPLIES) ×4
PADDING CAST COTTON 4X4 STRL (CAST SUPPLIES) ×4
PLATE RADIUS 10 HOLE (Plate) ×3 IMPLANT
SCREW NON LOCK 3.5X10MM (Screw) ×3 IMPLANT
SCREW NONLOCK HEX 3.5X12 (Screw) ×15 IMPLANT
SPLINT PLASTER CAST XFAST 3X15 (CAST SUPPLIES) ×1 IMPLANT
SPLINT PLASTER XTRA FASTSET 3X (CAST SUPPLIES) ×2
SPONGE LAP 4X18 RFD (DISPOSABLE) IMPLANT
SUT ETHILON 3 0 PS 1 (SUTURE) ×6 IMPLANT
SUT MNCRL AB 4-0 PS2 18 (SUTURE) ×1 IMPLANT
SUT PROLENE 3 0 PS 2 (SUTURE) IMPLANT
SUT VIC AB 3-0 FS2 27 (SUTURE) IMPLANT
SUT VIC AB 4-0 PS2 27 (SUTURE) ×3 IMPLANT
SYR CONTROL 10ML LL (SYRINGE) IMPLANT
SYSTEM CHEST DRAIN TLS 7FR (DRAIN) IMPLANT
TOWEL OR 17X24 6PK STRL BLUE (TOWEL DISPOSABLE) ×4 IMPLANT
TOWEL OR 17X26 10 PK STRL BLUE (TOWEL DISPOSABLE) ×4 IMPLANT
TUBE CONNECTING 12'X1/4 (SUCTIONS) ×1
TUBE CONNECTING 12X1/4 (SUCTIONS) ×3 IMPLANT
TUBE EVACUATION TLS (MISCELLANEOUS) ×1 IMPLANT
UNDERPAD 30X30 (UNDERPADS AND DIAPERS) ×4 IMPLANT
WATER STERILE IRR 1000ML POUR (IV SOLUTION) ×1 IMPLANT

## 2018-05-06 NOTE — Anesthesia Postprocedure Evaluation (Signed)
Anesthesia Post Note  Patient: [name]  Procedure(s) Performed: OPEN REDUCTION INTERNAL FIXATION (ORIF) LEFT RADIAL FRACTURE (Left Arm Lower)     Patient location during evaluation: PACU Anesthesia Type: Regional Level of consciousness: awake and alert Pain management: pain level controlled Vital Signs Assessment: post-procedure vital signs reviewed and stable Respiratory status: spontaneous breathing, nonlabored ventilation and respiratory function stable Cardiovascular status: stable and blood pressure returned to baseline Postop Assessment: no apparent nausea or vomiting Anesthetic complications: no    Last Vitals:  Vitals:   05/06/18 1836 05/06/18 1851  BP: 125/79 120/83  Pulse: 66 65  Resp: (!) 27 (!) 31  Temp: 36.7 C   SpO2: 95% 98%    Last Pain:  Vitals:   05/06/18 1915  TempSrc:   PainSc: 4                  Cecile HearingStephen Edward Shakeia Krus

## 2018-05-06 NOTE — Anesthesia Procedure Notes (Signed)
Anesthesia Regional Block: Supraclavicular block   Pre-Anesthetic Checklist: ,, timeout performed, Correct Patient, Correct Site, Correct Laterality, Correct Procedure, Correct Position, site marked, Risks and benefits discussed,  Surgical consent,  Pre-op evaluation,  At surgeon's request and post-op pain management  Laterality: Upper and Left  Prep: chloraprep       Needles:  Injection technique: Single-shot  Needle Type: Echogenic Needle          Additional Needles:   Procedures:,,,, ultrasound used (permanent image in chart),,,,  Narrative:  Start time: 05/06/2018 4:29 PM End time: 05/06/2018 4:35 PM Injection made incrementally with aspirations every 5 mL.  Performed by: Personally   Additional Notes: H+P and labs reviewed, risks and benefits discussed with patient, procedure tolerated well without complications

## 2018-05-06 NOTE — Anesthesia Procedure Notes (Signed)
Procedure Name: MAC Date/Time: 05/06/2018 4:48 PM Performed by: White, Amedeo Plenty, CRNA Pre-anesthesia Checklist: Patient identified, Emergency Drugs available, Suction available and Patient being monitored Patient Re-evaluated:Patient Re-evaluated prior to induction Oxygen Delivery Method: Nasal cannula
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
## 2018-05-06 NOTE — Transfer of Care (Signed)
Immediate Anesthesia Transfer of Care Note  Patient: [name]  Procedure(s) Performed: OPEN REDUCTION INTERNAL FIXATION (ORIF) LEFT RADIAL FRACTURE (Left Arm Lower)  Patient Location: PACU  Anesthesia Type:MAC combined with regional for post-op pain  Level of Consciousness: awake, alert , oriented and patient cooperative  Airway & Oxygen Therapy: Patient Spontanous Breathing and Patient connected to nasal cannula oxygen  Post-op Assessment: Report given to RN and Post -op Vital signs reviewed and stable  Post vital signs: Reviewed and stable  Last Vitals:  Vitals Value Taken Time  BP 109/62 05/06/2018  6:06 PM  Temp 36.3 C 05/06/2018  6:06 PM  Pulse 81 05/06/2018  6:07 PM  Resp 29 05/06/2018  6:07 PM  SpO2 98 % 05/06/2018  6:07 PM  Vitals shown include unvalidated device data.  Last Pain:  Vitals:   05/06/18 1343  TempSrc:   PainSc: 8       Patients Stated Pain Goal: 4 (51/02/58 5277)  Complications: No apparent anesthesia complications

## 2018-05-06 NOTE — Discharge Instructions (Addendum)

## 2018-05-06 NOTE — Anesthesia Preprocedure Evaluation (Signed)
Anesthesia Evaluation  Patient identified by MRN, date of birth, ID band Patient awake    Reviewed: Allergy & Precautions, NPO status , Patient's Chart, lab work & pertinent test results  History of Anesthesia Complications Negative for: history of anesthetic complications  Airway Mallampati: II  TM Distance: >3 FB Neck ROM: Full    Dental  (+) Upper Dentures, Lower Dentures   Pulmonary asthma , COPD,  COPD inhaler, Current Smoker,     + wheezing      Cardiovascular negative cardio ROS   Rhythm:Regular     Neuro/Psych PSYCHIATRIC DISORDERS Anxiety Depression Bipolar Disorder negative neurological ROS     GI/Hepatic Neg liver ROS, PUD, GERD  Medicated and Controlled,  Endo/Other  Hypothyroidism   Renal/GU Renal disease     Musculoskeletal negative musculoskeletal ROS (+)   Abdominal   Peds  Hematology negative hematology ROS (+)   Anesthesia Other Findings   Reproductive/Obstetrics                             Anesthesia Physical Anesthesia Plan  ASA: III  Anesthesia Plan: MAC and Regional   Post-op Pain Management:    Induction:   PONV Risk Score and Plan: 1 and Ondansetron and Treatment may vary due to age or medical condition  Airway Management Planned: Nasal Cannula  Additional Equipment:   Intra-op Plan:   Post-operative Plan:   Informed Consent: I have reviewed the patients History and Physical, chart, labs and discussed the procedure including the risks, benefits and alternatives for the proposed anesthesia with the patient or authorized representative who has indicated his/her understanding and acceptance.   Dental advisory given  Plan Discussed with: CRNA and Surgeon  Anesthesia Plan Comments:         Anesthesia Quick Evaluation

## 2018-05-06 NOTE — Op Note (Signed)
I assisted Surgeon(s) and Role:    * Betha LoaKuzma, Kevin, MD - Primary    Cindee Salt* Buddie Marston, MD - Assisting on the Procedure(s): OPEN REDUCTION INTERNAL FIXATION (ORIF) LEFT RADIAL FRACTURE on 05/06/2018.  I provided assistance on this case as follows: setup, approach, debridement of the fracture, reduction, stabilization and fixation of the fracture, closure of the wound and application of the dressing and splint.  Electronically signed by: Nicki ReaperKUZMA,Marnesha Gagen R, MD Date: 05/06/2018 Time: 6:00 PM
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
## 2018-05-07 ENCOUNTER — Encounter (HOSPITAL_COMMUNITY): Payer: Self-pay | Admitting: Orthopedic Surgery

## 2018-05-16 ENCOUNTER — Ambulatory Visit: Payer: Medicaid Other | Admitting: Neurology

## 2018-05-20 ENCOUNTER — Ambulatory Visit: Payer: Self-pay | Admitting: Student in an Organized Health Care Education/Training Program

## 2018-05-22 ENCOUNTER — Other Ambulatory Visit: Payer: Self-pay

## 2018-05-22 ENCOUNTER — Encounter (HOSPITAL_COMMUNITY): Payer: Self-pay

## 2018-05-22 ENCOUNTER — Emergency Department (HOSPITAL_COMMUNITY)
Admission: EM | Admit: 2018-05-22 | Discharge: 2018-05-22 | Disposition: A | Discharge status: Home / Self Care | Payer: Medicaid Other | Attending: Emergency Medicine | Admitting: Emergency Medicine

## 2018-05-22 ENCOUNTER — Emergency Department (HOSPITAL_COMMUNITY): Payer: Medicaid Other

## 2018-05-22 DIAGNOSIS — F17210 Nicotine dependence, cigarettes, uncomplicated: Secondary | ICD-10-CM | POA: Insufficient documentation

## 2018-05-22 DIAGNOSIS — Z79899 Other long term (current) drug therapy: Secondary | ICD-10-CM | POA: Insufficient documentation

## 2018-05-22 DIAGNOSIS — Y998 Other external cause status: Secondary | ICD-10-CM | POA: Insufficient documentation

## 2018-05-22 DIAGNOSIS — E039 Hypothyroidism, unspecified: Secondary | ICD-10-CM | POA: Diagnosis not present

## 2018-05-22 DIAGNOSIS — J449 Chronic obstructive pulmonary disease, unspecified: Secondary | ICD-10-CM | POA: Insufficient documentation

## 2018-05-22 DIAGNOSIS — Y929 Unspecified place or not applicable: Secondary | ICD-10-CM | POA: Insufficient documentation

## 2018-05-22 DIAGNOSIS — Y9389 Activity, other specified: Secondary | ICD-10-CM | POA: Diagnosis not present

## 2018-05-22 DIAGNOSIS — R51 Headache: Secondary | ICD-10-CM | POA: Diagnosis present

## 2018-05-22 DIAGNOSIS — Y33XXXA Other specified events, undetermined intent, initial encounter: Secondary | ICD-10-CM | POA: Diagnosis not present

## 2018-05-22 DIAGNOSIS — S161XXA Strain of muscle, fascia and tendon at neck level, initial encounter: Secondary | ICD-10-CM | POA: Diagnosis not present

## 2018-05-22 MED ORDER — CYCLOBENZAPRINE HCL 10 MG PO TABS: 10.0000 mg | ORAL_TABLET | Freq: Two times a day (BID) | ORAL | 0 refills | Status: DC | PRN

## 2018-05-22 MED ORDER — OXYCODONE-ACETAMINOPHEN 5-325 MG PO TABS
1.0000 | ORAL_TABLET | Freq: Once | ORAL | Status: AC
  Administered 2018-05-22: 1 via ORAL
  Filled 2018-05-22: qty 1

## 2018-05-22 MED ORDER — MORPHINE SULFATE (PF) 4 MG/ML IV SOLN
4.0000 mg | Freq: Once | INTRAVENOUS | Status: AC
  Administered 2018-05-22: 4 mg via INTRAMUSCULAR
  Filled 2018-05-22: qty 1
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
## 2018-05-22 NOTE — ED Notes (Signed)
Patient Alert and oriented to baseline. Stable and ambulatory to baseline. Patient verbalized understanding of the discharge instructions.  Patient belongings were taken by the patient.   

## 2018-05-22 NOTE — ED Provider Notes (Cosign Needed)
Patient placed in Quick Look pathway, seen and evaluated   Chief Complaint: Headache, neck pain  HPI:   [name] presents today for headache and neck pain.  She has a neurologist at [facility].  She reports that she on Saturday coughed and had sudden onset of pain in her left sided neck.  She called her neurologist who told her to come here.  She reports that moving her head and touch make it hurt more.    ROS: No fevers, no numbness or tingling  Physical Exam:   Gen: No distress  Neuro: Awake and Alert  Skin: Warm    Focused Exam: Face is symmetrical.  Speech is not slurred.  She is in no distress.  Reports that she is very nervous.    Initiation of care has begun. The patient has been counseled on the process, plan, and necessity for staying for the completion/evaluation, and the remainder of the medical screening examination    Cristina GongHammond, Lawrence Roldan W, Cordelia Poche-C 05/22/18 1326

## 2018-05-22 NOTE — ED Notes (Signed)
Patient was called to room x2 with no answer. Lobby was checked and patient by this name was not located.

## 2018-05-22 NOTE — ED Notes (Signed)
Pt apparently walked outside to take a phone call.
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
## 2018-05-22 NOTE — ED Notes (Signed)
ED Provider at bedside. 

## 2018-06-25 ENCOUNTER — Ambulatory Visit: Payer: Medicaid Other | Admitting: Neurology

## 2018-06-25 ENCOUNTER — Encounter

## 2018-06-30 ENCOUNTER — Other Ambulatory Visit: Payer: Self-pay | Admitting: Student in an Organized Health Care Education/Training Program

## 2018-06-30 DIAGNOSIS — J449 Chronic obstructive pulmonary disease, unspecified: Secondary | ICD-10-CM

## 2018-07-15 ENCOUNTER — Encounter

## 2018-07-15 ENCOUNTER — Ambulatory Visit: Payer: Self-pay | Admitting: Neurology

## 2018-07-16 ENCOUNTER — Other Ambulatory Visit: Payer: Self-pay | Admitting: Student in an Organized Health Care Education/Training Program

## 2018-07-16 DIAGNOSIS — J449 Chronic obstructive pulmonary disease, unspecified: Secondary | ICD-10-CM

## 2018-07-18 ENCOUNTER — Encounter (HOSPITAL_COMMUNITY): Payer: Self-pay

## 2018-07-18 ENCOUNTER — Emergency Department (HOSPITAL_COMMUNITY)
Admission: EM | Admit: 2018-07-18 | Discharge: 2018-07-18 | Disposition: A | Discharge status: Home / Self Care | Payer: Medicaid Other | Attending: Emergency Medicine | Admitting: Emergency Medicine

## 2018-07-18 ENCOUNTER — Emergency Department (HOSPITAL_COMMUNITY): Payer: Medicaid Other

## 2018-07-18 ENCOUNTER — Other Ambulatory Visit: Payer: Self-pay

## 2018-07-18 DIAGNOSIS — F17210 Nicotine dependence, cigarettes, uncomplicated: Secondary | ICD-10-CM | POA: Diagnosis not present

## 2018-07-18 DIAGNOSIS — E039 Hypothyroidism, unspecified: Secondary | ICD-10-CM | POA: Diagnosis not present

## 2018-07-18 DIAGNOSIS — Z79899 Other long term (current) drug therapy: Secondary | ICD-10-CM | POA: Insufficient documentation

## 2018-07-18 DIAGNOSIS — J441 Chronic obstructive pulmonary disease with (acute) exacerbation: Secondary | ICD-10-CM | POA: Insufficient documentation

## 2018-07-18 DIAGNOSIS — R0602 Shortness of breath: Secondary | ICD-10-CM | POA: Diagnosis present

## 2018-07-18 DIAGNOSIS — J449 Chronic obstructive pulmonary disease, unspecified: Secondary | ICD-10-CM

## 2018-07-18 LAB — CBC
HEMATOCRIT: 47.8 % — AB (ref 36.0–46.0)
Hemoglobin: 15.8 g/dL — ABNORMAL HIGH (ref 12.0–15.0)
MCH: 30.1 pg (ref 26.0–34.0)
MCHC: 33.1 g/dL (ref 30.0–36.0)
MCV: 91 fL (ref 78.0–100.0)
Platelets: 259 10*3/uL (ref 150–400)
RBC: 5.25 MIL/uL — ABNORMAL HIGH (ref 3.87–5.11)
RDW: 12.1 % (ref 11.5–15.5)
WBC: 5.8 10*3/uL (ref 4.0–10.5)

## 2018-07-18 LAB — I-STAT TROPONIN, ED: Troponin i, poc: 0.02 ng/mL (ref 0.00–0.08)

## 2018-07-18 LAB — I-STAT BETA HCG BLOOD, ED (MC, WL, AP ONLY): I-stat hCG, quantitative: <5 m[IU]/mL (ref <5)

## 2018-07-18 LAB — BASIC METABOLIC PANEL
Anion gap: 12 (ref 5–15)
BUN: 10 mg/dL (ref 6–20)
CHLORIDE: 104 mmol/L (ref 98–111)
CO2: 24 mmol/L (ref 22–32)
Calcium: 9.9 mg/dL (ref 8.9–10.3)
Creatinine, Ser: 0.77 mg/dL (ref 0.44–1.00)
GFR calc Af Amer: >60 mL/min (ref >60)
GFR calc non Af Amer: >60 mL/min (ref >60)
Glucose, Bld: 104 mg/dL — ABNORMAL HIGH (ref 70–99)
POTASSIUM: 4.8 mmol/L (ref 3.5–5.1)
Sodium: 140 mmol/L (ref 135–145)

## 2018-07-18 MED ORDER — OXYCODONE HCL 5 MG PO TABS
5.0000 mg | ORAL_TABLET | Freq: Once | ORAL | Status: AC
  Administered 2018-07-18: 5 mg via ORAL
  Filled 2018-07-18: qty 1

## 2018-07-18 MED ORDER — LORAZEPAM 1 MG PO TABS
1.0000 mg | ORAL_TABLET | Freq: Once | ORAL | Status: AC
  Administered 2018-07-18: 1 mg via ORAL
  Filled 2018-07-18: qty 1

## 2018-07-18 MED ORDER — PREDNISONE 20 MG PO TABS: 40.0000 mg | ORAL_TABLET | Freq: Every day | ORAL | 0 refills | Status: DC

## 2018-07-18 MED ORDER — ALBUTEROL SULFATE (2.5 MG/3ML) 0.083% IN NEBU
5.0000 mg | INHALATION_SOLUTION | Freq: Once | RESPIRATORY_TRACT | Status: AC
  Administered 2018-07-18: 5 mg via RESPIRATORY_TRACT
  Filled 2018-07-18: qty 6

## 2018-07-18 MED ORDER — ALBUTEROL SULFATE (5 MG/ML) 0.5% IN NEBU: 5.0000 mg | INHALATION_SOLUTION | RESPIRATORY_TRACT | 0 refills | Status: DC | PRN

## 2018-07-18 MED ORDER — IPRATROPIUM BROMIDE 0.02 % IN SOLN
0.5000 mg | Freq: Once | RESPIRATORY_TRACT | Status: AC
  Administered 2018-07-18: 0.5 mg via RESPIRATORY_TRACT
  Filled 2018-07-18: qty 2.5

## 2018-07-18 NOTE — ED Triage Notes (Signed)
Pt states that she has been SOB X1 week coughing "black with a runny nose" Pt reports using her own neb treatment at home with another given by EMS PTA and 125 solumedrol. EMS reports wheezing upon arrival.
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
## 2018-07-18 NOTE — ED Notes (Signed)
Pt went to x-ray.

## 2018-07-18 NOTE — ED Notes (Signed)
Pt ambulated in the hallway staying at 95% O2.

## 2018-07-18 NOTE — Discharge Instructions (Signed)
Please read and follow all provided instructions.  Your diagnoses today include:  1. COPD exacerbation (HCC)     Tests performed today include:  Chest x-ray - did not show pneumonia  Blood counts and electrolytes  EKG and blood test for the heart  Vital signs. See below for your results today.   Medications prescribed:   Prednisone - steroid medicine   It is best to take this medication in the morning to prevent sleeping problems. If you are diabetic, monitor your blood sugar closely and stop taking Prednisone if blood sugar is over 300. Take with food to prevent stomach upset.   Take any prescribed medications only as directed.  Home care instructions:  Follow any educational materials contained in this packet.  Use your albuterol nebulizer every 4 hours for the next 24 hours upon returning home.  This should help keep your symptoms under control.  Follow-up instructions: Please follow-up with your primary care provider in the next 3 days for further evaluation of your symptoms and management of your COPD.   Return instructions:   Please return to the Emergency Department if you experience worsening symptoms.  Please return with worsening wheezing, shortness of breath, or difficulty breathing.  Return with persistent fever above 101F.   Please return if you have any other emergent concerns.  Additional Information:  Your vital signs today were: BP 116/80    Pulse 81    Resp (!) 24    Ht 5' (1.524 m)    Wt 56.7 kg    LMP  (LMP Unknown)    SpO2 100%    BMI 24.41 kg/m  If your blood pressure (BP) was elevated above 135/85 this visit, please have this repeated by your doctor within one month. --------------

## 2018-07-18 NOTE — ED Notes (Signed)
Patient given discharge instructions and verbalized understanding.  Patient stable to discharge at this time.  Patient is alert and oriented to baseline.  No distressed noted at this time.  All belongings taken with the patient at discharge.   

## 2018-08-01 ENCOUNTER — Other Ambulatory Visit: Payer: Self-pay | Admitting: Student in an Organized Health Care Education/Training Program

## 2018-08-01 DIAGNOSIS — J449 Chronic obstructive pulmonary disease, unspecified: Secondary | ICD-10-CM

## 2018-08-01 NOTE — Telephone Encounter (Signed)
Pt requesting refill on her proair and inhaler. Please advise

## 2018-08-02 NOTE — Telephone Encounter (Signed)
If patient is still having breathing symptoms, please ask her to schedule an ED follow up visit so we can make sure her COPD exacerbation is improving. Access to care would be an appropriate place to schedule.

## 2018-08-05 NOTE — Telephone Encounter (Signed)
Called pt and pt daughter answered phone and stated that pt has appointment scheduled for 08/07/18. [name], [name], [place]

## 2018-08-07 ENCOUNTER — Ambulatory Visit (INDEPENDENT_AMBULATORY_CARE_PROVIDER_SITE_OTHER): Payer: Medicaid Other | Admitting: Student in an Organized Health Care Education/Training Program

## 2018-08-07 ENCOUNTER — Other Ambulatory Visit: Payer: Self-pay

## 2018-08-07 ENCOUNTER — Encounter: Payer: Self-pay | Admitting: Student in an Organized Health Care Education/Training Program

## 2018-08-07 VITALS — BP 106/72 | HR 71 | Temp 98.0°F | Ht 60.0 in | Wt 121.0 lb

## 2018-08-07 DIAGNOSIS — J449 Chronic obstructive pulmonary disease, unspecified: Secondary | ICD-10-CM

## 2018-08-07 DIAGNOSIS — J441 Chronic obstructive pulmonary disease with (acute) exacerbation: Secondary | ICD-10-CM

## 2018-08-07 DIAGNOSIS — E039 Hypothyroidism, unspecified: Secondary | ICD-10-CM | POA: Diagnosis not present

## 2018-08-07 DIAGNOSIS — Z8711 Personal history of peptic ulcer disease: Secondary | ICD-10-CM

## 2018-08-07 DIAGNOSIS — Z23 Encounter for immunization: Secondary | ICD-10-CM

## 2018-08-07 DIAGNOSIS — Z8719 Personal history of other diseases of the digestive system: Secondary | ICD-10-CM

## 2018-08-07 MED ORDER — TIOTROPIUM BROMIDE MONOHYDRATE 18 MCG IN CAPS: 18.0000 ug | ORAL_CAPSULE | Freq: Every day | RESPIRATORY_TRACT | 0 refills | Status: DC

## 2018-08-07 MED ORDER — PROAIR HFA 108 (90 BASE) MCG/ACT IN AERS: INHALATION_SPRAY | RESPIRATORY_TRACT | 0 refills | Status: DC

## 2018-08-07 MED ORDER — FLUTICASONE-SALMETEROL 250-50 MCG/DOSE IN AEPB: INHALATION_SPRAY | RESPIRATORY_TRACT | 5 refills | Status: DC

## 2018-08-07 MED ORDER — SUCRALFATE 1 GM/10ML PO SUSP: 1.0000 g | Freq: Three times a day (TID) | ORAL | 1 refills | Status: DC

## 2018-08-07 MED ORDER — ALBUTEROL SULFATE (5 MG/ML) 0.5% IN NEBU: 5.0000 mg | INHALATION_SOLUTION | RESPIRATORY_TRACT | 0 refills | Status: DC | PRN

## 2018-08-07 NOTE — Patient Instructions (Addendum)
It was a pleasure seeing you today in our clinic. Here is the treatment plan we have discussed and agreed upon together:  We drew blood work at today's visit. I will call or send you a letter with these results. If you do not hear from me within the next week, please give our office a call.  A consult was placed to Pulmonology at today's visit.  You will receive a call to schedule an appointment. If you do not receive a call within two weeks please call our office so we can place the consult again.  Please return for nurse visit to get your pneumonia vaccine.  Please schedule a TOBACCO CESSATION visit with Dr. Raymondo Band.   Our clinic's number is [phone]. Please call with questions or concerns about what we discussed today.  Be well, Dr. Mosetta Putt

## 2018-08-07 NOTE — Progress Notes (Signed)
CC: COPD/dyspnea  HPI: [name] is a 47 y.o. female with PMH significant for COPD, PUD, tobacco use, chronic pain syndrome, anxiety state, bipolar I, who presents to [facility] today with worsening dyspnea/COPD symptoms.  During the interview the patient is challenging to direct and focus on a single problem.   Patient reports today with worsening of chronic COPD symptoms.  She reports that she has had shortness of breath with ambulation short distances such as from her bedroom to the bathroom.  She reports that she has had no chest pain.  She reports that she has had no wheezing.  She is taking her Advair 2 puffs twice daily.  She is taking as needed albuterol, however requiring this medication 5 times per day.  She takes albuterol for shortness of breath.  Chronic cough which is nonproductive. No recent fevers.  Patient was seen for this problem in the ED on 9/6. She reports she completed a short course of steroids at that time but never got back to baseline. It has been several years since she completed PFTs.  She reports that she is trying to quit smoking and has cut back on the number of cigarettes she uses per day.  Patient also mentioned she would like to discuss her history of esophagitis. Asked her to schedule a return visit to focus on this problem. She is agreeable.   Review of Symptoms:  See HPI for ROS.   CC, SH/smoking status, and VS noted.  Objective: BP 106/72   Pulse 71   Temp 98 F (36.7 C) (Oral)   Ht 5' (1.524 m)   Wt 121 lb (54.9 kg)   LMP  (LMP Unknown)   SpO2 96%   BMI 23.63 kg/m  GEN: NAD, alert, cooperative, and pleasant. NECK: full ROM, no thyromegaly RESPIRATORY: +soft wheezes appreciated throughout. Comfortable work of breathing, speaks in full sentences, no rhonchi or rales CV: RRR, no m/r/g, no peripheral edema GI: soft, non-tender, non-distended SKIN: warm and dry, no rashes or lesions NEURO: II-XII grossly intact PSYCH: AAOx3  Assessment and  plan: 1. COPD (HCC)  - seems that baseline COPD symptoms have worsened. She is not currently in an exacerbation. She is comfortable in the office and desats to 91% when walked around our entire office with ambulatory pulse ox.  - Will add Spiriva to her medication regimen. - Refer to pulmonology for PFT - refill advair and albuterol - flu vaccine given, we are out of pneumovax so she will have to return for this - referral to Dr. Raymondo Band for tobacco cessation  2. Hypothyroidism, unspecified type Not discussed today but preceptor noted TSH is outdated so will retest and have patient follow up for management of results. - TSH   Orders Placed This Encounter  Procedures  . Flu Vaccine QUAD 36+ mos IM  . TSH  . Ambulatory referral to Pulmonology    Referral Priority:   Routine    Referral Type:   Consultation    Referral Reason:   Specialty Services Required    Requested Specialty:   Pulmonary Disease    Number of Visits Requested:   1    Meds ordered this encounter  Medications  . tiotropium (SPIRIVA HANDIHALER) 18 MCG inhalation capsule    Sig: Place 1 capsule (18 mcg total) into inhaler and inhale daily.    Dispense:  90 capsule    Refill:  0  . albuterol (PROVENTIL) (5 MG/ML) 0.5% nebulizer solution    Sig: Take  1 mL (5 mg total) by nebulization every 4 (four) hours as needed for wheezing or shortness of breath.    Dispense:  20 mL    Refill:  0  . DISCONTD: sucralfate (CARAFATE) 1 GM/10ML suspension    Sig: Take 10 mLs (1 g total) by mouth 4 (four) times daily -  with meals and at bedtime.    Dispense:  420 mL    Refill:  1  . DISCONTD: PROAIR HFA 108 (90 Base) MCG/ACT inhaler    Sig: As needed for shortness of breath    Dispense:  8.5 g    Refill:  0  . Fluticasone-Salmeterol (ADVAIR DISKUS) 250-50 MCG/DOSE AEPB    Sig: INHALE 1 PUFF INTO THE LUNGS TWICE DAILY    Dispense:  60 each    Refill:  5  . sucralfate (CARAFATE) 1 GM/10ML suspension    Sig: Take 10 mLs (1 g  total) by mouth 4 (four) times daily -  with meals and at bedtime.    Dispense:  420 mL    Refill:  1  . PROAIR HFA 108 (90 Base) MCG/ACT inhaler    Sig: As needed for shortness of breath    Dispense:  8.5 g    Refill:  0     Howard Pouch, MD,MS,  PGY3 08/11/2018 6:07 AM

## 2018-08-08 LAB — TSH: TSH: 0.071 u[IU]/mL — ABNORMAL LOW (ref 0.450–4.500)

## 2018-08-11 ENCOUNTER — Encounter: Payer: Self-pay | Admitting: Student in an Organized Health Care Education/Training Program

## 2018-08-13 ENCOUNTER — Telehealth: Payer: Self-pay | Admitting: Student in an Organized Health Care Education/Training Program

## 2018-08-13 ENCOUNTER — Other Ambulatory Visit: Payer: Self-pay | Admitting: Student in an Organized Health Care Education/Training Program

## 2018-08-13 DIAGNOSIS — E039 Hypothyroidism, unspecified: Secondary | ICD-10-CM

## 2018-08-13 MED ORDER — LEVOTHYROXINE SODIUM 125 MCG PO TABS: 125.0000 ug | ORAL_TABLET | Freq: Every day | ORAL | 0 refills | Status: DC

## 2018-08-13 NOTE — Telephone Encounter (Signed)
Called and spoke with the patient.  Informed her of her low TSH level which suggests that her Synthroid dose is too high.  We will decrease from 137 mcg to 125 mcg daily.  Send this medication to her pharmacy.  Additionally asked patient to schedule a lab visit for a TSH recheck in 6 weeks.  I will put this in as a future order.  Patient voices understanding and agreement with this plan.  She additionally indicates that her breathing has improved since her recent visit for COPD.  Howard Pouch, MD PGY-3 [facility]

## 2018-08-14 ENCOUNTER — Ambulatory Visit: Payer: Self-pay

## 2018-08-27 ENCOUNTER — Encounter (HOSPITAL_COMMUNITY): Payer: Self-pay | Admitting: Emergency Medicine

## 2018-08-27 ENCOUNTER — Emergency Department (HOSPITAL_COMMUNITY)
Admission: EM | Admit: 2018-08-27 | Discharge: 2018-08-28 | Disposition: A | Discharge status: Home / Self Care | Payer: Medicaid Other | Attending: Emergency Medicine | Admitting: Emergency Medicine

## 2018-08-27 DIAGNOSIS — F319 Bipolar disorder, unspecified: Secondary | ICD-10-CM | POA: Insufficient documentation

## 2018-08-27 DIAGNOSIS — R197 Diarrhea, unspecified: Secondary | ICD-10-CM | POA: Diagnosis present

## 2018-08-27 DIAGNOSIS — F17210 Nicotine dependence, cigarettes, uncomplicated: Secondary | ICD-10-CM | POA: Diagnosis not present

## 2018-08-27 DIAGNOSIS — E039 Hypothyroidism, unspecified: Secondary | ICD-10-CM | POA: Insufficient documentation

## 2018-08-27 DIAGNOSIS — G8929 Other chronic pain: Secondary | ICD-10-CM | POA: Diagnosis not present

## 2018-08-27 DIAGNOSIS — Z79899 Other long term (current) drug therapy: Secondary | ICD-10-CM | POA: Diagnosis not present

## 2018-08-27 DIAGNOSIS — R1013 Epigastric pain: Secondary | ICD-10-CM

## 2018-08-27 DIAGNOSIS — R112 Nausea with vomiting, unspecified: Secondary | ICD-10-CM | POA: Insufficient documentation

## 2018-08-27 DIAGNOSIS — K529 Noninfective gastroenteritis and colitis, unspecified: Secondary | ICD-10-CM | POA: Insufficient documentation

## 2018-08-27 LAB — COMPREHENSIVE METABOLIC PANEL
ALK PHOS: 81 U/L (ref 38–126)
ALT: 24 U/L (ref 0–44)
ANION GAP: 11 (ref 5–15)
AST: 31 U/L (ref 15–41)
Albumin: 4.1 g/dL (ref 3.5–5.0)
BILIRUBIN TOTAL: 1.3 mg/dL — AB (ref 0.3–1.2)
BUN: 10 mg/dL (ref 6–20)
CALCIUM: 9.5 mg/dL (ref 8.9–10.3)
CO2: 26 mmol/L (ref 22–32)
Chloride: 104 mmol/L (ref 98–111)
Creatinine, Ser: 0.77 mg/dL (ref 0.44–1.00)
GFR calc non Af Amer: >60 mL/min (ref >60)
GLUCOSE: 98 mg/dL (ref 70–99)
Potassium: 3.6 mmol/L (ref 3.5–5.1)
SODIUM: 141 mmol/L (ref 135–145)
Total Protein: 7.7 g/dL (ref 6.5–8.1)

## 2018-08-27 LAB — CBC
HCT: 49.7 % — ABNORMAL HIGH (ref 36.0–46.0)
Hemoglobin: 15.9 g/dL — ABNORMAL HIGH (ref 12.0–15.0)
MCH: 29.1 pg (ref 26.0–34.0)
MCHC: 32 g/dL (ref 30.0–36.0)
MCV: 90.9 fL (ref 80.0–100.0)
NRBC: 0 % (ref 0.0–0.2)
PLATELETS: 318 10*3/uL (ref 150–400)
RBC: 5.47 MIL/uL — AB (ref 3.87–5.11)
RDW: 12 % (ref 11.5–15.5)
WBC: 8.6 10*3/uL (ref 4.0–10.5)

## 2018-08-27 LAB — LIPASE, BLOOD: Lipase: 29 U/L (ref 11–51)

## 2018-08-27 LAB — I-STAT BETA HCG BLOOD, ED (MC, WL, AP ONLY): I-stat hCG, quantitative: <5 m[IU]/mL (ref <5)

## 2018-08-27 MED ORDER — DICYCLOMINE HCL 10 MG/ML IM SOLN
20.0000 mg | Freq: Once | INTRAMUSCULAR | Status: AC
  Administered 2018-08-27: 20 mg via INTRAMUSCULAR
  Filled 2018-08-27: qty 2

## 2018-08-27 MED ORDER — SODIUM CHLORIDE 0.9 % IV BOLUS
1000.0000 mL | Freq: Once | INTRAVENOUS | Status: AC
  Administered 2018-08-27: 1000 mL via INTRAVENOUS

## 2018-08-27 MED ORDER — METOCLOPRAMIDE HCL 5 MG/ML IJ SOLN
10.0000 mg | Freq: Once | INTRAMUSCULAR | Status: AC
  Administered 2018-08-27: 10 mg via INTRAVENOUS
  Filled 2018-08-27: qty 2

## 2018-08-27 MED ORDER — FAMOTIDINE IN NACL 20-0.9 MG/50ML-% IV SOLN
20.0000 mg | Freq: Once | INTRAVENOUS | Status: AC
  Administered 2018-08-27: 20 mg via INTRAVENOUS
  Filled 2018-08-27: qty 50

## 2018-08-27 NOTE — ED Triage Notes (Signed)
Patient c/o abdominal pain with N/V/D x3 days.
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
## 2018-08-27 NOTE — ED Notes (Signed)
Pt aware that urine sample is needed.  Pt sts "I aint been able to pee"  RN notified

## 2018-08-28 LAB — URINALYSIS, ROUTINE W REFLEX MICROSCOPIC
Bilirubin Urine: NEGATIVE
Glucose, UA: NEGATIVE mg/dL
HGB URINE DIPSTICK: NEGATIVE
Ketones, ur: NEGATIVE mg/dL
LEUKOCYTES UA: NEGATIVE
NITRITE: NEGATIVE
PROTEIN: NEGATIVE mg/dL
SPECIFIC GRAVITY, URINE: 1.012 (ref 1.005–1.030)
pH: 7 (ref 5.0–8.0)

## 2018-08-28 MED ORDER — SODIUM CHLORIDE 0.9 % IV SOLN
Freq: Once | INTRAVENOUS | Status: AC
  Administered 2018-08-28: 01:00:00 via INTRAVENOUS

## 2018-08-28 MED ORDER — ONDANSETRON HCL 4 MG/2ML IJ SOLN
4.0000 mg | Freq: Once | INTRAMUSCULAR | Status: AC
  Administered 2018-08-28: 4 mg via INTRAVENOUS
  Filled 2018-08-28: qty 2

## 2018-08-28 MED ORDER — MORPHINE SULFATE (PF) 4 MG/ML IV SOLN
4.0000 mg | Freq: Once | INTRAVENOUS | Status: AC
  Administered 2018-08-28: 4 mg via INTRAVENOUS
  Filled 2018-08-28: qty 1

## 2018-08-28 MED ORDER — KETOROLAC TROMETHAMINE 30 MG/ML IJ SOLN
30.0000 mg | Freq: Once | INTRAMUSCULAR | Status: AC
  Administered 2018-08-28: 30 mg via INTRAVENOUS
  Filled 2018-08-28: qty 1

## 2018-08-28 MED ORDER — PROMETHAZINE HCL 25 MG PO TABS: 25.0000 mg | ORAL_TABLET | Freq: Four times a day (QID) | ORAL | 0 refills | Status: DC | PRN

## 2018-08-28 MED ORDER — DICYCLOMINE HCL 20 MG PO TABS: 20.0000 mg | ORAL_TABLET | Freq: Two times a day (BID) | ORAL | 0 refills | Status: DC | PRN

## 2018-08-28 MED ORDER — MORPHINE SULFATE (PF) 4 MG/ML IV SOLN
2.0000 mg | Freq: Once | INTRAVENOUS | Status: AC
  Administered 2018-08-28: 2 mg via INTRAVENOUS
  Filled 2018-08-28: qty 1

## 2018-08-28 NOTE — Discharge Instructions (Signed)
Your work-up in the emergency department was reassuring.  We recommend that you continue your daily prescribed medicines.  You have been given a prescription for Phenergan to use for nausea.  Take Bentyl as prescribed for abdominal pain and cramping.  We advise follow-up with your primary care doctor to ensure resolution of pain, vomiting.  You may return to the ED for new or concerning symptoms.

## 2018-09-11 ENCOUNTER — Other Ambulatory Visit: Payer: Self-pay | Admitting: Student in an Organized Health Care Education/Training Program

## 2018-09-11 DIAGNOSIS — J441 Chronic obstructive pulmonary disease with (acute) exacerbation: Secondary | ICD-10-CM

## 2018-09-11 MED ORDER — ALBUTEROL SULFATE (5 MG/ML) 0.5% IN NEBU: 5.0000 mg | INHALATION_SOLUTION | RESPIRATORY_TRACT | 0 refills | Status: DC | PRN

## 2018-09-16 ENCOUNTER — Institutional Professional Consult (permissible substitution): Payer: Self-pay | Admitting: Internal Medicine

## 2018-09-18 ENCOUNTER — Encounter: Payer: Self-pay | Admitting: Internal Medicine

## 2018-09-18 ENCOUNTER — Ambulatory Visit (INDEPENDENT_AMBULATORY_CARE_PROVIDER_SITE_OTHER): Payer: Medicaid Other | Admitting: Internal Medicine

## 2018-09-18 DIAGNOSIS — F17210 Nicotine dependence, cigarettes, uncomplicated: Secondary | ICD-10-CM

## 2018-09-18 DIAGNOSIS — J449 Chronic obstructive pulmonary disease, unspecified: Secondary | ICD-10-CM | POA: Diagnosis not present

## 2018-09-18 DIAGNOSIS — J441 Chronic obstructive pulmonary disease with (acute) exacerbation: Secondary | ICD-10-CM | POA: Insufficient documentation

## 2018-09-18 MED ORDER — PREDNISONE 10 MG PO TABS: ORAL_TABLET | ORAL | 0 refills | Status: DC

## 2018-09-18 MED ORDER — TIOTROPIUM BROMIDE-OLODATEROL 2.5-2.5 MCG/ACT IN AERS: 2.0000 | INHALATION_SPRAY | Freq: Every day | RESPIRATORY_TRACT | 11 refills | Status: DC

## 2018-09-18 MED ORDER — TIOTROPIUM BROMIDE-OLODATEROL 2.5-2.5 MCG/ACT IN AERS: 2.0000 | INHALATION_SPRAY | Freq: Every day | RESPIRATORY_TRACT | 0 refills | Status: DC

## 2018-09-18 NOTE — Patient Instructions (Addendum)
Plan A = Automatic = Stiolto 2 pffs each am   Plan B = Backup Only use your albuterol (PROAIR) as a rescue medication to be used if you can't catch your breath by resting or doing a relaxed purse lip breathing pattern.  - The less you use it, the better it will work when you need it. - Ok to use the inhaler up to 2 puffs  every 4 hours if you must but call for appointment if use goes up over your usual need - Don't leave home without it !!  (think of it like the spare tire for your car)    Plan C = Crisis - only use your albuterol nebulizer if you first try Plan B and it fails to help > ok to use the nebulizer up to every 4 hours but if start needing it regularly call for immediate appointment   Plan D = Deltasone (prednisone)  - Prednisone 10 mg take  4 each am x 2 days,   2 each am x 2 days,  1 each am x 2 days and stop    The key is to stop smoking completely before smoking completely stops you!    Please schedule a follow up office visit in 4 weeks, sooner if needed  with all medications /inhalers/ solutions in hand so we can verify exactly what you are taking. This includes all medications from all doctors and over the counters  - PFTs on return and alpha one AT screening
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
## 2018-09-19 ENCOUNTER — Encounter: Payer: Self-pay | Admitting: Internal Medicine

## 2018-09-19 NOTE — Assessment & Plan Note (Signed)
>>  ASSESSMENT AND PLAN FOR COPD WITH ACUTE EXACERBATION (HCC) WRITTEN ON 09/19/2018  6:22 AM BY Sherene Sires, MICHAEL B, MD  Spirometry 09/18/2018  FEV1 1.1 (44%)  Ratio 49 with classic curvature p am advair/spiriva dpi's - 09/18/2018  After extensive coaching inhaler device,  effectiveness =    75% with smi so try change to stiolto with pred x 6 d as "plan D"    DDX of  difficult airways management almost all start with A and  include Adherence, Ace Inhibitors, Acid Reflux, Active Sinus Disease, Alpha 1 Antitripsin deficiency, Anxiety masquerading as Airways dz,  ABPA,  Allergy(esp in young), Aspiration (esp in elderly), Adverse effects of meds,  Active smoking or vaping, A bunch of PE's (a small clot burden can't cause this syndrome unless there is already severe underlying pulm or vascular dz with poor reserve) plus two Bs  = Bronchiectasis and Beta blocker use..and one C= CHF   Adherence is always the initial "prime suspect" and is a multilayered concern that requires a "trust but verify" approach in every patient - starting with knowing how to use medications, especially inhalers, correctly, keeping up with refills and understanding the fundamental difference between maintenance and prns vs those medications only taken for a very short course and then stopped and not refilled.  - see device teaching above - return with all meds in hand using a trust but verify approach to confirm accurate Medication  Reconciliation The principal here is that until we are certain that the  patients are doing what we've asked, it makes no sense to ask them to do more.    Active smoking at top of the usual list of suspects (see separate a/p)   ? Adverse effects of dpi > change to smi trial basis   ? Anxiety > usually at the bottom of this list of usual suspects but should be much higher on this pt's based on H and P and note already on psychotropics and may interfere with adherence/ ability to stop smoking and also  interpretation of response or lack thereof to symptom management which can be quite subjective.   ? Allergy/ asthma less likely probably does not need ICS   ? Alpha one def > needs screen at next ov

## 2018-09-19 NOTE — Assessment & Plan Note (Signed)
4-5 min discussion re active cigarette smoking in addition to office E&M  Ask about tobacco use:   ongoing Advise quitting   I emphasized that although we never turn away smokers from the pulmonary clinic, we do ask that they understand that the recommendations that we make  won't work nearly as well in the presence of continued cigarette exposure. In fact, we may very well  reach a point where we can't promise to help the patient if he/she can't quit smoking. (We can and will promise to try to help, we just can't promise what we recommend will really work)  Assess willingness:  Not committed at this point Assist in quit attempt:  Per PCP when ready Arrange follow up:   Follow up per Primary Care planned        Total time devoted to counseling  > 50 % of initial 60 min office visit:  review case with pt/ discussion of options/alternatives/ personally creating written customized instructions  in presence of pt  then going over those specific  Instructions directly with the pt including how to use all of the meds but in particular covering each new medication in detail and the difference between the maintenance= "automatic" meds and the prns using an action plan format for the latter (If this problem/symptom => do that organization reading Left to right).  Please see AVS from this visit for a full list of these instructions which I personally wrote for this pt and  are unique to this visit.   See device teaching which extended face to face time for this visit   

## 2018-09-19 NOTE — Assessment & Plan Note (Signed)
Spirometry 09/18/2018  FEV1 1.1 (44%)  Ratio 49 with classic curvature p am advair/spiriva dpi's - 09/18/2018  After extensive coaching inhaler device,  effectiveness =    75% with smi so try change to stiolto with pred x 6 d as "plan D"    DDX of  difficult airways management almost all start with A and  include Adherence, Ace Inhibitors, Acid Reflux, Active Sinus Disease, Alpha 1 Antitripsin deficiency, Anxiety masquerading as Airways dz,  ABPA,  Allergy(esp in young), Aspiration (esp in elderly), Adverse effects of meds,  Active smoking or vaping, A bunch of PE's (a small clot burden can't cause this syndrome unless there is already severe underlying pulm or vascular dz with poor reserve) plus two Bs  = Bronchiectasis and Beta blocker use..and one C= CHF   Adherence is always the initial "prime suspect" and is a multilayered concern that requires a "trust but verify" approach in every patient - starting with knowing how to use medications, especially inhalers, correctly, keeping up with refills and understanding the fundamental difference between maintenance and prns vs those medications only taken for a very short course and then stopped and not refilled.  - see device teaching above - return with all meds in hand using a trust but verify approach to confirm accurate Medication  Reconciliation The principal here is that until we are certain that the  patients are doing what we've asked, it makes no sense to ask them to do more.    Active smoking at top of the usual list of suspects (see separate a/p)   ? Adverse effects of dpi > change to smi trial basis   ? Anxiety > usually at the bottom of this list of usual suspects but should be much higher on this pt's based on H and P and note already on psychotropics and may interfere with adherence/ ability to stop smoking and also interpretation of response or lack thereof to symptom management which can be quite subjective.   ? Allergy/ asthma less  likely probably does not need ICS   ? Alpha one def > needs screen at next ov

## 2018-10-06 ENCOUNTER — Telehealth: Payer: Self-pay | Admitting: Internal Medicine

## 2018-10-06 MED ORDER — TIOTROPIUM BROMIDE-OLODATEROL 2.5-2.5 MCG/ACT IN AERS: 2.0000 | INHALATION_SPRAY | Freq: Every day | RESPIRATORY_TRACT | 3 refills | Status: DC

## 2018-10-06 NOTE — Telephone Encounter (Signed)
Prescription has been sent in. Nothing further needed.  

## 2018-10-20 ENCOUNTER — Other Ambulatory Visit: Payer: Self-pay | Admitting: Student in an Organized Health Care Education/Training Program

## 2018-10-20 DIAGNOSIS — J441 Chronic obstructive pulmonary disease with (acute) exacerbation: Secondary | ICD-10-CM

## 2018-10-27 ENCOUNTER — Ambulatory Visit: Payer: Self-pay | Admitting: Internal Medicine

## 2018-11-03 ENCOUNTER — Other Ambulatory Visit: Payer: Self-pay | Admitting: Student in an Organized Health Care Education/Training Program

## 2018-11-03 DIAGNOSIS — J441 Chronic obstructive pulmonary disease with (acute) exacerbation: Secondary | ICD-10-CM

## 2018-11-04 NOTE — Telephone Encounter (Signed)
I am covering Dr. Feng's inbox. I will provide 1 month refill.   

## 2018-11-25 ENCOUNTER — Other Ambulatory Visit: Payer: Self-pay | Admitting: Student in an Organized Health Care Education/Training Program

## 2018-11-25 DIAGNOSIS — J441 Chronic obstructive pulmonary disease with (acute) exacerbation: Secondary | ICD-10-CM

## 2018-12-01 ENCOUNTER — Other Ambulatory Visit: Payer: Self-pay | Admitting: Family Medicine

## 2018-12-01 DIAGNOSIS — J441 Chronic obstructive pulmonary disease with (acute) exacerbation: Secondary | ICD-10-CM

## 2018-12-22 ENCOUNTER — Other Ambulatory Visit: Payer: Self-pay | Admitting: Student in an Organized Health Care Education/Training Program

## 2018-12-22 DIAGNOSIS — E039 Hypothyroidism, unspecified: Secondary | ICD-10-CM

## 2019-01-02 ENCOUNTER — Ambulatory Visit: Payer: Medicaid Other | Admitting: Student in an Organized Health Care Education/Training Program

## 2019-01-06 ENCOUNTER — Telehealth: Payer: Self-pay

## 2019-01-06 ENCOUNTER — Ambulatory Visit: Payer: Medicaid Other | Admitting: Student in an Organized Health Care Education/Training Program

## 2019-01-06 NOTE — Telephone Encounter (Signed)
Patient left message wanting to speak with PCP about increasing levothyroxine.  Call back is [phone].  Ples Specter, RN [facility] RN)

## 2019-01-08 NOTE — Telephone Encounter (Signed)
Done, pt has appt 01/14/2019. Sunday Spillers, CMA

## 2019-01-08 NOTE — Telephone Encounter (Signed)
She should schedule a visit to come in and discuss her thyroid concerns. We cannot adjust this medication over the phone.

## 2019-01-10 ENCOUNTER — Other Ambulatory Visit: Payer: Self-pay | Admitting: Internal Medicine

## 2019-01-12 ENCOUNTER — Other Ambulatory Visit: Payer: Self-pay | Admitting: Student in an Organized Health Care Education/Training Program

## 2019-01-12 DIAGNOSIS — J441 Chronic obstructive pulmonary disease with (acute) exacerbation: Secondary | ICD-10-CM

## 2019-01-14 ENCOUNTER — Ambulatory Visit: Payer: Medicaid Other | Admitting: Student in an Organized Health Care Education/Training Program

## 2019-01-15 ENCOUNTER — Other Ambulatory Visit: Payer: Self-pay | Admitting: Internal Medicine

## 2019-01-16 ENCOUNTER — Other Ambulatory Visit: Payer: Self-pay | Admitting: Student in an Organized Health Care Education/Training Program

## 2019-01-16 ENCOUNTER — Telehealth: Payer: Self-pay | Admitting: *Deleted

## 2019-01-16 DIAGNOSIS — J441 Chronic obstructive pulmonary disease with (acute) exacerbation: Secondary | ICD-10-CM

## 2019-01-16 NOTE — Telephone Encounter (Signed)
Pt states that she only gets a "little bit in her nebulizer vials".  She would like for [place] to call in more.  Advised that I needed to call pharmacy to clarify.   Contacted pharmacy. They are not sure what she means but will clarify with her when she comes to pick up the other meds.  Fleeger, Maryjo Rochester, CMA

## 2019-01-16 NOTE — Telephone Encounter (Signed)
Pt walks in and is out of inhaler and is nervous about going the weekend without it. Dr. McDiarmid filled it. Fleeger, Maryjo Rochester, CMA

## 2019-01-19 ENCOUNTER — Ambulatory Visit: Payer: Medicaid Other | Admitting: Student in an Organized Health Care Education/Training Program

## 2019-02-21 ENCOUNTER — Other Ambulatory Visit: Payer: Self-pay | Admitting: Internal Medicine

## 2019-02-21 ENCOUNTER — Other Ambulatory Visit: Payer: Self-pay | Admitting: Student in an Organized Health Care Education/Training Program

## 2019-02-21 DIAGNOSIS — J441 Chronic obstructive pulmonary disease with (acute) exacerbation: Secondary | ICD-10-CM

## 2019-03-02 ENCOUNTER — Other Ambulatory Visit: Payer: Self-pay | Admitting: *Deleted

## 2019-03-02 DIAGNOSIS — J441 Chronic obstructive pulmonary disease with (acute) exacerbation: Secondary | ICD-10-CM

## 2019-03-02 MED ORDER — PROAIR HFA 108 (90 BASE) MCG/ACT IN AERS: INHALATION_SPRAY | RESPIRATORY_TRACT | 3 refills | Status: DC

## 2019-03-02 NOTE — Telephone Encounter (Signed)
Pt calls because she has been out of her proair x 7 days.  Apologized and reassured her that there seems to be a glitch because I show an approved refill request on 02/21/19 but shows the date of 01/16/19.  Verbal given from Dr. Artist Pais to fill one inhaler now and will send additional request to Dr. Mosetta Putt, as pt is requesting to have 2 inhalers at one time so that she does not run out.

## 2019-03-13 ENCOUNTER — Other Ambulatory Visit: Payer: Self-pay | Admitting: Internal Medicine

## 2019-04-23 ENCOUNTER — Telehealth: Payer: Self-pay | Admitting: Student in an Organized Health Care Education/Training Program

## 2019-04-23 NOTE — Telephone Encounter (Signed)
Pt is calling and requesting refill or enough of Klonopin be called in. She is waiting to get appointment with a new psychiatrist. [name]

## 2019-05-01 NOTE — Telephone Encounter (Signed)
Since [name] has not received Klonopin from our office for a long time according to the [place] controlled substances database, I would like for her to be seen in our clinic to discuss this before I prescribe this medication to her.  Thanks.

## 2019-05-04 NOTE — Telephone Encounter (Signed)
Contacted pt and informed her of below and scheduled her an appointment to discuss meds.April Zimmerman Rumple, CMA

## 2019-05-06 ENCOUNTER — Other Ambulatory Visit: Payer: Self-pay | Admitting: Student in an Organized Health Care Education/Training Program

## 2019-05-06 DIAGNOSIS — J441 Chronic obstructive pulmonary disease with (acute) exacerbation: Secondary | ICD-10-CM

## 2019-05-06 DIAGNOSIS — E039 Hypothyroidism, unspecified: Secondary | ICD-10-CM

## 2019-05-18 ENCOUNTER — Other Ambulatory Visit: Payer: Self-pay | Admitting: Student in an Organized Health Care Education/Training Program

## 2019-05-18 DIAGNOSIS — E039 Hypothyroidism, unspecified: Secondary | ICD-10-CM

## 2019-05-19 ENCOUNTER — Encounter: Payer: Self-pay | Admitting: Family Medicine

## 2019-05-19 ENCOUNTER — Other Ambulatory Visit: Payer: Self-pay

## 2019-05-19 ENCOUNTER — Ambulatory Visit (INDEPENDENT_AMBULATORY_CARE_PROVIDER_SITE_OTHER): Payer: Medicaid Other | Admitting: Family Medicine

## 2019-05-19 VITALS — BP 106/62 | HR 60

## 2019-05-19 DIAGNOSIS — E038 Other specified hypothyroidism: Secondary | ICD-10-CM

## 2019-05-19 DIAGNOSIS — F17210 Nicotine dependence, cigarettes, uncomplicated: Secondary | ICD-10-CM

## 2019-05-19 DIAGNOSIS — Z114 Encounter for screening for human immunodeficiency virus [HIV]: Secondary | ICD-10-CM | POA: Diagnosis present

## 2019-05-19 DIAGNOSIS — L03012 Cellulitis of left finger: Secondary | ICD-10-CM

## 2019-05-19 DIAGNOSIS — F411 Generalized anxiety disorder: Secondary | ICD-10-CM | POA: Diagnosis not present

## 2019-05-19 MED ORDER — CLONAZEPAM 1 MG PO TABS: 1.0000 mg | ORAL_TABLET | Freq: Two times a day (BID) | ORAL | 2 refills | Status: DC

## 2019-05-19 MED ORDER — SERTRALINE HCL 50 MG PO TABS: 50.0000 mg | ORAL_TABLET | Freq: Every day | ORAL | 2 refills | Status: DC

## 2019-05-19 MED ORDER — MUPIROCIN 2 % EX OINT: 1.0000 "application " | TOPICAL_OINTMENT | Freq: Two times a day (BID) | CUTANEOUS | 0 refills | Status: DC

## 2019-05-19 NOTE — Assessment & Plan Note (Addendum)
Since patient's anxiety has been well controlled on Klonopin 1 mg twice daily and Zoloft in the past, will restart Zoloft and continue Klonopin for now.  Once she establishes with a psychiatrist, she will need to get her medications from them.  The risks of Klonopin were detailed with the patient, and she expressed understanding.  I will be checking to make sure that patient establishes with a psychiatrist in the next few months.  The [place] controlled substances database was checked, and her last prescription was in November 2019.

## 2019-05-19 NOTE — Assessment & Plan Note (Signed)
Encouraged patient to reach out to [place] whenever she feels ready to quit tobacco use.

## 2019-05-19 NOTE — Assessment & Plan Note (Signed)
Will check TSH today.  Unfortunately patient did not follow-up for a TSH recheck 6 weeks after her Synthroid was adjusted in 2019.  Discussed with patient that we will have to use the TSH value to guide dosing of her Synthroid rather than her symptoms unfortunately, but it does sound like her symptoms correlate with a low thyroid level.  Her pulse was borderline bradycardic today, which could also indicate undertreated hypothyroidism.

## 2019-05-19 NOTE — Patient Instructions (Addendum)
It was nice meeting you today Ms. Lekas!  For your anxiety, we will restart Zoloft 50 mg once daily.  We can increase this dose if needed in the future, just call and let me know if you feel that this is needed.  We will also continue Klonopin twice daily since this has worked for you so well.  This medication does have its risks including withdrawal and can be stolen and sold on the street, so please protect this medication and use it carefully.  Your thumb appears to have some inflammation right at the cuticle, but it does not have an area that we can drain right now.  This will likely get better by continuing to apply Neosporin or another antibiotic that you can put on the skin.  Please also soak your thumb in warm water 10 to 15 minutes 3 times a day and apply the antibiotic ointment on the skin after it dries.  Today we will check your TSH level and do regular HIV screening for health maintenance.  I will let you know if your TSH level is not normal and if we need to adjust your thyroid medication.  Please see me again in 6 months to 1 year or as needed.  If you have any questions or concerns, please feel free to call the clinic.   Be well,  Dr. Shan Levans

## 2019-05-19 NOTE — Assessment & Plan Note (Signed)
There does not appear to be a drainable abscess, so patient was advised to soak her thumb in warm water for 10 to 15 minutes 3 times daily and to apply a topical antibiotic to the area afterward.  She was prescribed mupirocin.  She was given return precautions including worsening of erythema, development of abscess, and increasing pain.
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
## 2019-05-20 LAB — TSH: TSH: 2.42 u[IU]/mL (ref 0.450–4.500)

## 2019-05-20 LAB — HIV ANTIBODY (ROUTINE TESTING W REFLEX): HIV Screen 4th Generation wRfx: NONREACTIVE

## 2019-05-25 ENCOUNTER — Telehealth: Payer: Self-pay | Admitting: *Deleted

## 2019-05-25 ENCOUNTER — Other Ambulatory Visit: Payer: Self-pay | Admitting: Family Medicine

## 2019-05-25 DIAGNOSIS — E038 Other specified hypothyroidism: Secondary | ICD-10-CM

## 2019-05-25 NOTE — Telephone Encounter (Signed)
Informed pt of below and she wanted to know if there is anyway that you can increase her medication a little bit because she just feels so bad.  She also asked about the rest of her labs that were taken. [name], CMA

## 2019-05-25 NOTE — Telephone Encounter (Signed)
-----   Message from Melinda Alu, MD sent at 05/25/2019  9:38 AM EDT ----- Would you please call [name] and let her know that her thyroid level was normal from the labs we took at her last visit, so we should continue her medication at its current dose.  We will continue to test that in the future and alter it if her thyroid level is out of the normal range.  Thanks!

## 2019-05-25 NOTE — Telephone Encounter (Signed)
Pt informed of below and lab appointment scheduled. Joseguadalupe Stan Zimmerman Rumple, CMA

## 2019-05-25 NOTE — Telephone Encounter (Signed)
Although I cannot increase her Synthroid dose based on this lab result, we can get further labs to see if her T4 and T3 levels are abnormal.  If they are low, we may be able to increase the dose. I will send a future lab order if you would schedule her for a lab appointment.  You can also let her know that her HIV was negative.  Thank you!

## 2019-05-25 NOTE — Progress Notes (Signed)
T4/T3 labs ordered

## 2019-05-29 ENCOUNTER — Other Ambulatory Visit: Payer: Medicaid Other

## 2019-06-12 ENCOUNTER — Other Ambulatory Visit: Payer: Medicaid Other

## 2019-06-30 ENCOUNTER — Encounter: Payer: Self-pay | Admitting: Family Medicine

## 2019-08-10 ENCOUNTER — Other Ambulatory Visit: Payer: Self-pay | Admitting: Family Medicine

## 2019-08-24 ENCOUNTER — Other Ambulatory Visit: Payer: Self-pay | Admitting: Family Medicine

## 2019-08-24 ENCOUNTER — Other Ambulatory Visit: Payer: Self-pay | Admitting: Internal Medicine

## 2019-08-24 ENCOUNTER — Telehealth: Payer: Self-pay | Admitting: Family Medicine

## 2019-08-24 DIAGNOSIS — J441 Chronic obstructive pulmonary disease with (acute) exacerbation: Secondary | ICD-10-CM

## 2019-08-24 MED ORDER — PROAIR HFA 108 (90 BASE) MCG/ACT IN AERS: INHALATION_SPRAY | RESPIRATORY_TRACT | 2 refills | Status: DC

## 2019-08-24 NOTE — Telephone Encounter (Signed)
Patient needs urgent refill on her Proair inhaler. Please advise.

## 2019-08-24 NOTE — Telephone Encounter (Signed)
Refill sent. Thanks

## 2019-09-02 ENCOUNTER — Other Ambulatory Visit: Payer: Self-pay | Admitting: Family Medicine

## 2019-09-04 ENCOUNTER — Other Ambulatory Visit: Payer: Self-pay | Admitting: Family Medicine

## 2019-09-04 DIAGNOSIS — F319 Bipolar disorder, unspecified: Secondary | ICD-10-CM

## 2019-09-21 ENCOUNTER — Other Ambulatory Visit: Payer: Self-pay | Admitting: Internal Medicine

## 2019-09-21 ENCOUNTER — Other Ambulatory Visit: Payer: Self-pay | Admitting: Family Medicine

## 2019-09-21 DIAGNOSIS — J441 Chronic obstructive pulmonary disease with (acute) exacerbation: Secondary | ICD-10-CM

## 2019-09-28 ENCOUNTER — Telehealth: Payer: Self-pay | Admitting: Internal Medicine

## 2019-09-28 ENCOUNTER — Other Ambulatory Visit: Payer: Self-pay | Admitting: Internal Medicine

## 2019-09-28 MED ORDER — STIOLTO RESPIMAT 2.5-2.5 MCG/ACT IN AERS: 2.0000 | INHALATION_SPRAY | Freq: Every day | RESPIRATORY_TRACT | 0 refills | Status: DC

## 2019-09-28 NOTE — Telephone Encounter (Signed)
Patient needs to have her lung medication filled here because the lung doctor's phone is not working.  She also wanted to make sure she had the right phone number for that doctor, where she was referred by [place].  Please call her at [phone].

## 2019-09-28 NOTE — Telephone Encounter (Signed)
LVM to call office back and inform her that Dr. Melvyn Novas refused her refill because she needs to schedule an appointment.  His number is [phone] so that she can contact them and schedule an appointment.  Maybe suggest that she ask if they will refill enough until her appointment. Dr. Shan Levans does not prescribe this medication so she should contact Dr. Gustavus Bryant office.[name], CMA

## 2019-09-28 NOTE — Telephone Encounter (Signed)
Called and spoke with pt letting her know that I was going to send 1 inhaler of stiolto to pharmacy and stated to her that she must keep her up coming OV for further refills. Pt verbalized understanding. Verified pt's preferred pharmacy and sent med in for pt. Nothing further needed.

## 2019-09-29 ENCOUNTER — Other Ambulatory Visit: Payer: Self-pay | Admitting: Family Medicine

## 2019-09-29 NOTE — Telephone Encounter (Signed)
I will refill her clonazepam at this time, but [name] needs to make an appointment with me to discuss whether she will start seeing a psychiatrist for further prescriptions or start weaning down on this medication with me.  We discussed finding a psychiatrist at her last visit with me several months ago.  I will not refill this medication again until she sees me.  Please give her a call to make this appointment.  Thank you.

## 2019-09-29 NOTE — Telephone Encounter (Signed)
Informed pt of below and appointment scheduled. [name], CMA  

## 2019-10-01 ENCOUNTER — Ambulatory Visit: Payer: Medicaid Other | Admitting: Internal Medicine

## 2019-10-14 ENCOUNTER — Ambulatory Visit: Payer: Medicaid Other | Admitting: Family Medicine

## 2019-10-15 ENCOUNTER — Ambulatory Visit: Payer: Medicaid Other | Admitting: Family Medicine

## 2019-10-15 ENCOUNTER — Encounter (HOSPITAL_COMMUNITY): Payer: Self-pay

## 2019-10-15 ENCOUNTER — Ambulatory Visit (HOSPITAL_COMMUNITY)
Admission: EM | Admit: 2019-10-15 | Discharge: 2019-10-15 | Disposition: A | Discharge status: Home / Self Care | Payer: Medicaid Other | Attending: Family Medicine | Admitting: Family Medicine

## 2019-10-15 ENCOUNTER — Other Ambulatory Visit: Payer: Self-pay | Admitting: Family Medicine

## 2019-10-15 ENCOUNTER — Ambulatory Visit (HOSPITAL_COMMUNITY)
Admission: EM | Admit: 2019-10-15 | Discharge: 2019-10-15 | Discharge status: Against Medical Advice | Payer: Medicaid Other

## 2019-10-15 ENCOUNTER — Ambulatory Visit: Payer: Medicaid Other | Admitting: Internal Medicine

## 2019-10-15 ENCOUNTER — Other Ambulatory Visit: Payer: Self-pay

## 2019-10-15 DIAGNOSIS — L03113 Cellulitis of right upper limb: Secondary | ICD-10-CM

## 2019-10-15 MED ORDER — CEFTRIAXONE SODIUM 1 G IJ SOLR
INTRAMUSCULAR | Status: AC
  Filled 2019-10-15: qty 10

## 2019-10-15 MED ORDER — KETOROLAC TROMETHAMINE 30 MG/ML IJ SOLN
INTRAMUSCULAR | Status: AC
  Filled 2019-10-15: qty 1

## 2019-10-15 MED ORDER — KETOROLAC TROMETHAMINE 30 MG/ML IJ SOLN
30.0000 mg | Freq: Once | INTRAMUSCULAR | Status: AC
  Administered 2019-10-15: 20:00:00 30 mg via INTRAMUSCULAR

## 2019-10-15 MED ORDER — SERTRALINE HCL 50 MG PO TABS: 50.0000 mg | ORAL_TABLET | Freq: Every day | ORAL | 2 refills | Status: DC

## 2019-10-15 MED ORDER — CEFTRIAXONE SODIUM 1 G IJ SOLR
1.0000 g | Freq: Once | INTRAMUSCULAR | Status: AC
  Administered 2019-10-15: 1 g via INTRAMUSCULAR

## 2019-10-15 MED ORDER — CEPHALEXIN 500 MG PO CAPS: 500.0000 mg | ORAL_CAPSULE | Freq: Two times a day (BID) | ORAL | 0 refills | Status: DC

## 2019-10-15 NOTE — ED Triage Notes (Addendum)
Patient presents to Urgent Care with complaints of spider bite/ insect bite on her right posterior forearm since two days ago. Patient reports there is significant pain, redness and swelling noted. pt has drawn a blue circle where the redness was last night, reddened area has spread in all directions.  Pt raked leaves outside for several hours two days ago, states she is in so much pain it has brought her to tears the last two days, has been taking tylenol.

## 2019-10-15 NOTE — Discharge Instructions (Signed)
Please monitor the area  Please have close follow up.  Please try tylenol or ibuprofen for pain
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
## 2019-10-17 ENCOUNTER — Other Ambulatory Visit: Payer: Self-pay

## 2019-10-17 ENCOUNTER — Encounter (HOSPITAL_COMMUNITY): Payer: Self-pay

## 2019-10-17 ENCOUNTER — Ambulatory Visit (HOSPITAL_COMMUNITY)
Admission: EM | Admit: 2019-10-17 | Discharge: 2019-10-17 | Disposition: A | Discharge status: Home / Self Care | Payer: Medicaid Other | Attending: Urgent Care | Admitting: Urgent Care

## 2019-10-17 DIAGNOSIS — L03113 Cellulitis of right upper limb: Secondary | ICD-10-CM | POA: Diagnosis not present

## 2019-10-17 DIAGNOSIS — M79631 Pain in right forearm: Secondary | ICD-10-CM

## 2019-10-17 DIAGNOSIS — W57XXXD Bitten or stung by nonvenomous insect and other nonvenomous arthropods, subsequent encounter: Secondary | ICD-10-CM | POA: Diagnosis not present

## 2019-10-17 DIAGNOSIS — S40861D Insect bite (nonvenomous) of right upper arm, subsequent encounter: Secondary | ICD-10-CM

## 2019-10-17 MED ORDER — DOXYCYCLINE HYCLATE 100 MG PO CAPS: 100.0000 mg | ORAL_CAPSULE | Freq: Two times a day (BID) | ORAL | 0 refills | Status: DC

## 2019-10-17 MED ORDER — NAPROXEN 500 MG PO TABS: 500.0000 mg | ORAL_TABLET | Freq: Two times a day (BID) | ORAL | 0 refills | Status: DC

## 2019-10-17 MED ORDER — KETOROLAC TROMETHAMINE 60 MG/2ML IM SOLN
INTRAMUSCULAR | Status: AC
  Filled 2019-10-17: qty 2

## 2019-10-17 MED ORDER — KETOROLAC TROMETHAMINE 60 MG/2ML IM SOLN
60.0000 mg | Freq: Once | INTRAMUSCULAR | Status: AC
  Administered 2019-10-17: 60 mg via INTRAMUSCULAR

## 2019-10-17 NOTE — ED Triage Notes (Signed)
Pt present spider bite on her right arm. Pt states that the area has swollen and is very painful to the touch. Pt also has fever to the arm as well
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
## 2019-10-19 ENCOUNTER — Encounter: Payer: Self-pay | Admitting: Internal Medicine

## 2019-10-19 ENCOUNTER — Ambulatory Visit (INDEPENDENT_AMBULATORY_CARE_PROVIDER_SITE_OTHER): Payer: Medicaid Other | Admitting: Internal Medicine

## 2019-10-19 ENCOUNTER — Telehealth: Payer: Self-pay | Admitting: *Deleted

## 2019-10-19 DIAGNOSIS — F17210 Nicotine dependence, cigarettes, uncomplicated: Secondary | ICD-10-CM

## 2019-10-19 DIAGNOSIS — J441 Chronic obstructive pulmonary disease with (acute) exacerbation: Secondary | ICD-10-CM | POA: Diagnosis not present

## 2019-10-19 DIAGNOSIS — J449 Chronic obstructive pulmonary disease, unspecified: Secondary | ICD-10-CM

## 2019-10-19 MED ORDER — PREDNISONE 10 MG PO TABS: ORAL_TABLET | ORAL | 0 refills | Status: DC

## 2019-10-19 MED ORDER — PROAIR HFA 108 (90 BASE) MCG/ACT IN AERS: INHALATION_SPRAY | RESPIRATORY_TRACT | 2 refills | Status: DC

## 2019-10-19 MED ORDER — PROMETHAZINE-DM 6.25-15 MG/5ML PO SYRP: 5.0000 mL | ORAL_SOLUTION | Freq: Four times a day (QID) | ORAL | 0 refills | Status: DC | PRN
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
## 2019-10-19 NOTE — Telephone Encounter (Signed)
Pt states that she got bit by a spider last week.  She has been to urgent care x 2.  She states that her arm is swollen and red.    She refuses go to ED (due to lung condition) but is agreeable to appt with [place].   Advised to elevate and apply icepack.  She also complains of sores in her head.  Again advised her to go to ED but she refuses.  Red flags given to ED.  Christen Bame, CMA

## 2019-10-19 NOTE — Assessment & Plan Note (Signed)
Quit Smoking Spirometry 09/18/2018  FEV1 1.1 (44%)  Ratio 49 with classic curvature p am advair/spiriva dpi's - 09/18/2018  After extensive coaching inhaler device,  effectiveness =    75% with smi so try change to stiolto with pred x 6 d as "plan D"  > not doing as of 10/19/2019   Pt continues with more of a Group B pattern with cough likely more related to CB than AB so rec  1) stop smoking (see separate a/p)   2) finish doxy  3) continue stiolto for now but low threshold to change to [place] if keeps flaring on lama/laba   4) mucinex dm vs phenergan dm prn cough   F/u with cxr in two weeks if not better, 6 months if responds to above rx    Pt informed of the seriousness of COVID 19 infection as a direct risk to their health  and safey and to those of their loved ones and should continue to wear facemask in public and minimize exposure to public locations but especially avoid any area or activity where non-close contacts are not observing distancing or wearing an appropriate face mask.   Each maintenance medication was reviewed in detail including most importantly the difference between maintenance and as needed and under what circumstances the prns are to be used.  Please see AVS for specific  Instructions which are unique to this visit and I personally typed out  which were reviewed in detail over the phone with the patient and a copy provided via MyChart

## 2019-10-19 NOTE — Patient Instructions (Addendum)
For cough > mucinex dm 1200 mg every 12 hours or phenergan dm up to a tsp every 4 hours (but not both)  Prednisone 10 mg take  4 each am x 2 days,   2 each am x 2 days,  1 each am x 2 days and stop   Finish doxycycline   The key is to stop smoking completely before smoking completely stops you!    Return in 2 weeks if not better for cxr 0- if improved return in 6 months with all medications inhand

## 2019-10-19 NOTE — Assessment & Plan Note (Signed)
Counseled re importance of smoking cessation but did not meet time criteria for separate billing   °

## 2019-10-19 NOTE — Assessment & Plan Note (Signed)
>>  ASSESSMENT AND PLAN FOR COPD WITH ACUTE EXACERBATION (HCC) WRITTEN ON 10/19/2019  2:11 PM BY Sherene Sires, MICHAEL B, MD  Quit Smoking Spirometry 09/18/2018  FEV1 1.1 (44%)  Ratio 49 with classic curvature p am advair/spiriva dpi's - 09/18/2018  After extensive coaching inhaler device,  effectiveness =    75% with smi so try change to stiolto with pred x 6 d as "plan D"  > not doing as of 10/19/2019   Pt continues with more of a Group B pattern with cough likely more related to CB than AB so rec  1) stop smoking (see separate a/p)   2) finish doxy  3) continue stiolto for now but low threshold to change to [place] if keeps flaring on lama/laba   4) mucinex dm vs phenergan dm prn cough   F/u with cxr in two weeks if not better, 6 months if responds to above rx    Pt informed of the seriousness of COVID 19 infection as a direct risk to their health  and safey and to those of their loved ones and should continue to wear facemask in public and minimize exposure to public locations but especially avoid any area or activity where non-close contacts are not observing distancing or wearing an appropriate face mask.   Each maintenance medication was reviewed in detail including most importantly the difference between maintenance and as needed and under what circumstances the prns are to be used.  Please see AVS for specific  Instructions which are unique to this visit and I personally typed out  which were reviewed in detail over the phone with the patient and a copy provided via MyChart

## 2019-10-20 ENCOUNTER — Encounter: Payer: Self-pay | Admitting: Family Medicine

## 2019-10-20 ENCOUNTER — Ambulatory Visit (INDEPENDENT_AMBULATORY_CARE_PROVIDER_SITE_OTHER): Payer: Medicaid Other | Admitting: Family Medicine

## 2019-10-20 ENCOUNTER — Other Ambulatory Visit: Payer: Self-pay

## 2019-10-20 DIAGNOSIS — L02413 Cutaneous abscess of right upper limb: Secondary | ICD-10-CM | POA: Diagnosis present

## 2019-10-20 NOTE — Progress Notes (Signed)
     Subjective: Chief Complaint  Patient presents with  . Insect Bite    HPI: [name] is a 48 y.o. presenting to clinic today to discuss the following:  Right Forearm Cellulitis/Abscess Patient states she was bitten by something and since then she has had increasing redness, swelling, and pain to the area and surrounding area of her right forearm. She states she has been to urgent care twice where she was first started on Keflex, then switched to Doxy after no improvement. She states since her last urgent care appointment she has had increased size of the area of the bite with swelling, pus, and increased warmth, redness surrounding the area with increased pain. No fevers, chills, nausea, vomiting, or abdominal pain.     ROS noted in HPI.    Social History   Tobacco Use  Smoking Status Current Every Day Smoker  . Packs/day: 0.50  . Years: 30.00  . Pack years: 15.00  . Types: Cigarettes  Smokeless Tobacco Never Used    Objective: BP 94/68   Pulse 69   Wt 137 lb 12.8 oz (62.5 kg)   LMP  (LMP Unknown)   SpO2 97%   BMI 26.47 kg/m  Vitals and nursing notes reviewed  Physical Exam MSK: right forearm on the dorsal side. Large, swollen puss filled area with centralized necrotic tissue. Very tender, warm to touch with surrounding erythema.   Incision and Drainage Informed consent paper given, procedure and risks explained, and consent form signed. 1% lidocaine with epi injected subcutaneously around the abscess and then into the abcess. Performed an I&D with an 11 blade and used hemostat to ensure all necrotic tissue and pus was expressed. I did attempt to close the wound but due to surrounding tissue dehiscecne I was unable to close with sutures and left open. Wound irrigated with sterile saline. 4/4 folded and bandaged applied over wound.  Assessment/Plan:  Abscess of right forearm Given pus formation and worsening of erythema and pain performed I&D today. Would  left open. - Follow up tomorrow with Dr. Shan Levans, I attempted to get a CBC but by the time I was done with the procedure the lab was closed. - Ensure patient has started Doxy - Patient given Toradol 30mg  shot - If pain is still unbearable (It should be relieved with the I&D) then 3 days of Tramadol is not unreasonable and I would be willing to fill for patient Does not appear to be septic as vitals all normal with no fevers.  PATIENT EDUCATION PROVIDED: See AVS    Diagnosis and plan along with any newly prescribed medication(s) were discussed in detail with this patient today. The patient verbalized understanding and agreed with the plan. Patient advised if symptoms worsen return to clinic or ER.     Harolyn Rutherford, DO 10/20/2019, 4:20 PM PGY-3 [place]

## 2019-10-20 NOTE — Assessment & Plan Note (Signed)
Given pus formation and worsening of erythema and pain performed I&D today. Would left open. - Follow up tomorrow with Dr. Shan Levans, I attempted to get a CBC but by the time I was done with the procedure the lab was closed. - Ensure patient has started Doxy - Patient given Toradol 30mg  shot - If pain is still unbearable (It should be relieved with the I&D) then 3 days of Tramadol is not unreasonable and I would be willing to fill for patient

## 2019-10-20 NOTE — Patient Instructions (Signed)
It was great to meet you today! Thank you for letting me participate in your care!  Today, we discussed your abscess which I drained. Please take your antibiotic as this will help your symptoms improve and stop any further infection. If you develop fevers, chills, nausea, and/or vomiting go to the ED.  Be well, Harolyn Rutherford, DO PGY-3, [facility]

## 2019-10-21 ENCOUNTER — Ambulatory Visit (INDEPENDENT_AMBULATORY_CARE_PROVIDER_SITE_OTHER): Payer: Medicaid Other | Admitting: Family Medicine

## 2019-10-21 ENCOUNTER — Other Ambulatory Visit: Payer: Self-pay

## 2019-10-21 VITALS — BP 102/62 | HR 67 | Wt 137.8 lb

## 2019-10-21 DIAGNOSIS — L089 Local infection of the skin and subcutaneous tissue, unspecified: Secondary | ICD-10-CM | POA: Diagnosis present

## 2019-10-21 DIAGNOSIS — F411 Generalized anxiety disorder: Secondary | ICD-10-CM | POA: Diagnosis not present

## 2019-10-21 MED ORDER — CLONAZEPAM 1 MG PO TABS: 1.0000 mg | ORAL_TABLET | Freq: Two times a day (BID) | ORAL | 0 refills | Status: DC

## 2019-10-21 MED ORDER — KETOROLAC TROMETHAMINE 30 MG/ML IJ SOLN
30.0000 mg | Freq: Once | INTRAMUSCULAR | Status: AC
  Administered 2019-10-20: 30 mg via INTRAMUSCULAR

## 2019-10-21 MED ORDER — SERTRALINE HCL 50 MG PO TABS: 50.0000 mg | ORAL_TABLET | Freq: Every day | ORAL | 3 refills | Status: DC

## 2019-10-21 NOTE — Addendum Note (Signed)
Addended by: Junious Dresser on: 10/21/2019 08:22 AM   Modules accepted: Orders

## 2019-10-21 NOTE — Assessment & Plan Note (Addendum)
This could either be isolated infections of the skin, pustular acne, or epidermal cyst that have become infected.  Likely will resolve after pus was expressed from each lesion today.  Advised patient to wash the area with warm water and shampoo after her visit today and to keep her hands away from the area if possible.  She is already taking doxycycline for her right arm abscess, which should help to clear any remaining infection on her scalp as well.

## 2019-10-21 NOTE — Progress Notes (Signed)
Subjective:    [name] - 48 y.o. female MRN [id number]  Date of birth: [birth year]  CC:  CHRISTIN MOLINE is here for follow up of abscess and discussion of anxiety and depression.  HPI: Abscess on R arm Hurting much less now that it has been incised and drained No concerns currently about this However, also has had itchy, painful bumps on the back of her scalp These appear at the same time that the area on her arm did Denies fever, chills  Anxiety and depression Patient reports that Zoloft 50 mg and clonazepam 1 mg twice a day worked well for her Understands the risks of taking clonazepam but says that it works so well for her that she does not want to stop this Since that she has tried to get in with her therapist but many have been close due to Covid Would not like to change her medications currently  Health Maintenance:  Health Maintenance Due  Topic Date Due  . INFLUENZA VACCINE  06/13/2019    -  reports that she has been smoking cigarettes. She has a 15.00 pack-year smoking history. She has never used smokeless tobacco. - Review of Systems: Per HPI. - Past Medical History: Patient Active Problem List   Diagnosis Date Noted  . Pustular inflammation of skin 10/21/2019  . Abscess of right forearm 10/20/2019  . 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 C7 cervical fracture (HCC) 12/06/2016  . Tibia/fibula fracture, right, closed, initial encounter 12/06/2016  . Closed displaced fracture of distal phalanx of finger of right hand 12/06/2016  . Closed fracture of phalanx of finger of left hand 12/06/2016  . Multiple pelvic fractures (HCC)   . Closed fracture of left distal femur (HCC)   . Closed fracture of right distal femur (HCC)   . Chronic pain syndrome 10/06/2014  . 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  . 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 Cocaine abuse (HCC) 01/28/2012  . Marijuana abuse 01/28/2012  . Allergic rhinitis  05/08/2011  . Asthma 07/31/2010  . PEPTIC ULCER DISEASE 05/23/2010  . GRAVES DISEASE 01/09/2007  . Hypothyroidism 01/09/2007  . Bipolar I disorder (HCC) 01/09/2007  . Anxiety state 01/09/2007  . Cigarette smoker 01/09/2007  . 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   - Medications: reviewed and updated   Objective:   Physical Exam BP 102/62   Pulse 67   Wt 137 lb 12.8 oz (62.5 kg)   LMP  (LMP Unknown)   SpO2 96%   BMI 26.47 kg/m  Gen: NAD, alert, cooperative with exam, well-appearing CV: RRR, good S1/S2, no murmur Resp: CTABL, no wheezes, non-labored Skin: Well-healing wound on right arm without signs of infection, multiple erythematous pustules with overlying scabs on occipital area of scalp with pus easily expressed, tender to palpation, no surrounding erythema Psych: good insight, alert and oriented, appropriate mood and affect  Depression screen [facility] 2/9 10/21/2019 10/21/2019 10/20/2019  Decreased Interest 0 0 0  Down, Depressed, Hopeless 0 0 0  PHQ - 2 Score 0 0 0  Altered sleeping 1 - -  Tired, decreased energy 0 - -  Change in appetite 0 - -  Feeling bad or failure about yourself  0 - -  Trouble concentrating 0 - -  Moving slowly or fidgety/restless 0 - -  Suicidal thoughts 0 - -  PHQ-9 Score 1 - -  Difficult doing work/chores Not difficult at all - -  Some encounter information is confidential and  restricted. Go to Review Flowsheets activity to see all data.  Some recent data might be hidden   GAD 7 : Generalized Anxiety Score 10/21/2019  Nervous, Anxious, on Edge 1  Control/stop worrying 0  Worry too much - different things 0  Trouble relaxing 0  Restless 0  Easily annoyed or irritable 0  Afraid - awful might happen 0  Total GAD 7 Score 1  Anxiety Difficulty Not difficult at all  Some encounter information is confidential and restricted. Go to Review Flowsheets activity to see all data.       Assessment & Plan:   Anxiety state We will continue Zoloft 50 mg  daily and clonazepam 1 mg twice daily.  Counseled patient that Zoloft is a much better medication for this than clonazepam due to the habit-forming potential of clonazepam and it affect on daily functioning.  Counseled patient that I would like for her to either find a psychiatrist who will prescribe clonazepam for continue seeing me with plan to wean off of this medication.  Patient expressed understanding.  Gave her psychiatry resources covered by Medicaid in the area.  Pustular inflammation of skin This could either be isolated infections of the skin, pustular acne, or epidermal cyst that have become infected.  Likely will resolve after pus was expressed from each lesion today.  Advised patient to wash the area with warm water and shampoo after her visit today and to keep her hands away from the area if possible.  She is already taking doxycycline for her right arm abscess, which should help to clear any remaining infection on her scalp as well.    Maia Breslow, M.D. 10/21/2019, 4:57 PM PGY-3, [facility];

## 2019-10-21 NOTE — Assessment & Plan Note (Signed)
We will continue Zoloft 50 mg daily and clonazepam 1 mg twice daily.  Counseled patient that Zoloft is a much better medication for this than clonazepam due to the habit-forming potential of clonazepam and it affect on daily functioning.  Counseled patient that I would like for her to either find a psychiatrist who will prescribe clonazepam for continue seeing me with plan to wean off of this medication.  Patient expressed understanding.  Gave her psychiatry resources covered by Medicaid in the area.

## 2019-10-21 NOTE — Patient Instructions (Addendum)
It was nice seeing you today Ms. Bourassa!  You have a few small infected areas on your scalp, which should be getting better now that I have expressed pus from them.  Please wash your head with warm water and shampoo and try not to touch them in order to let them heal.  Please let me know if the areas begin to feel worse.  I have called in your Zoloft and clonazepam.  I am including a list of therapists in the area that will take your insurance.  Please get connected with one of them or let us know if you need help with this.  If you have any questions or concerns, please feel free to call the clinic.   Be well,  Dr. Frances Furbish  Psychiatry Resource List (Adults and Children) Most of these providers will take Medicaid. please consult your insurance for a complete and updated list of available providers. When calling to make an appointment have your insurance information available to confirm you are covered.  [facility] Behavioral Health Clinics:   [place]: [address]     [phone] [place]: [address] [place],        [phone] [place]: [address],    [phone] [place]ne Sharper: [address],                   [phone]  [facility]  (Psychiatry & counseling ; adults & children ; will take Medicaid) [address], [place], [place]        [phone]   [facility]  (Psychiatry only; Adults only, will take Medicaid)  [address], [place], [place] [address]       [phone]   [facility] (Psychiatry & counseling ; adults & children ; will take Medicaid [address]  [place] [address]   [phone]    (Spanish therapist)  [facility]  Psychiatry & counseling; Adults and children;  [address]    [place], [place] [address]    [phone]  [facility] (Psychiatry & counseling; adults & children; Medicare no  Medicaid)  [address]   [place], [place]  [address]      [phone]    [facility] (up to age 68)  Psychiatry & counseling ,will take Medicaid, must do counseling to receive psychiatry services  [address] [place] [address]        [phone]  [facility] (Psychiatry & counseling; adults & children; will take Medicaid) [address],  [place], [place]  [phone]  [facility]---  Walk-in Mon-Fri, 8:30-5:00 (will take Medicaid)  [address], [place], [place]  [phone]    [facility] --- Walk-In Mon-Friday 8am-3pm ( will take Medicaid, Psychiatry, Adults & children,  [address] [place], [place]   [phone]   [facility]--, Walk-in M-F 8am-12pm and 1pm -3pm   (Counseling, Psychiatry, will take Medicaid, adults & children)  [address], [place], [place]  [phone]    Psychiatry Resource List (Adults and Children) Most of these providers will take Medicaid. please consult your insurance for a complete and updated list of available providers. When calling to make an appointment have your insurance information available to confirm you are covered.  [facility]:   [place]: [address]     [phone] Linna Hoff: [address],        [phone] [place]: [place],    [place]: [address] [place],                   [place]  (Psychiatry & counseling ; adults & children ; will take Medicaid) [place], [place], [place]        [phone]   [place]  (Psychiatry only; Adults only, will take Medicaid)  [place], [place], [place] [address]       [phone]   [place] (Psychiatry & counseling ; adults & children ; will take Medicaid [address]  [place] [address]   [phone]    ([place]  therapist)  [facility]  Psychiatry & counseling; Adults and children;  [address]    [place], [place] [address]    [phone]  [facility] (Psychiatry & counseling; adults & children; Medicare no Medicaid)  [place], [place]  [address]      [phone]    [facility] (up to age 17)  Psychiatry & counseling ,will take Medicaid, must do counseling to receive psychiatry services  [address] [place] [address]        ([place] (Psychiatry & counseling; adults & children; will take Medicaid) [address],  [place], [place]  [phone]  [facility]---  Walk-in Mon-Fri, 8:30-5:00 (will take Medicaid)  [address], [place], [place]  [phone]    [facility] --- Walk-In Mon-Friday 8am-3pm ( will take Medicaid, Psychiatry, Adults & children,  [address] [place], [place]   [phone]   [facility]--, Walk-in M-F 8am-12pm and 1pm -3pm   (Counseling, Psychiatry, will take Medicaid, adults & children)  [address] [place], [place]  [phone]

## 2019-11-02 ENCOUNTER — Other Ambulatory Visit: Payer: Self-pay | Admitting: Internal Medicine

## 2019-11-03 ENCOUNTER — Telehealth: Payer: Self-pay | Admitting: Internal Medicine

## 2019-11-03 DIAGNOSIS — J441 Chronic obstructive pulmonary disease with (acute) exacerbation: Secondary | ICD-10-CM

## 2019-11-03 MED ORDER — PROAIR HFA 108 (90 BASE) MCG/ACT IN AERS: INHALATION_SPRAY | RESPIRATORY_TRACT | 2 refills | Status: DC

## 2019-11-03 MED ORDER — STIOLTO RESPIMAT 2.5-2.5 MCG/ACT IN AERS: 2.0000 | INHALATION_SPRAY | Freq: Every day | RESPIRATORY_TRACT | 5 refills | Status: DC

## 2019-11-03 NOTE — Telephone Encounter (Signed)
Refill of both stiolto and proair have been sent to pharmacy for pt. Called and spoke with pt letting her know this had been done and she verbalized understanding. Nothing further needed.

## 2019-11-16 ENCOUNTER — Other Ambulatory Visit: Payer: Self-pay | Admitting: Family Medicine

## 2019-11-16 DIAGNOSIS — E039 Hypothyroidism, unspecified: Secondary | ICD-10-CM

## 2019-11-17 NOTE — Telephone Encounter (Signed)
Patient calls nurse line wondering why her Klonopin was not refilled. I informed patient its looks like its too early to fill. Patient stated she is going out of town this evening and can only have her medications called into [facility] for insurance purposes. Patient "assured" me she still has a 3 day supply left, as due to be filled on 1/9. Please advise.

## 2019-11-17 NOTE — Addendum Note (Signed)
Addended by: Steva Colder on: 11/17/2019 12:10 PM   Modules accepted: Orders

## 2019-11-20 MED ORDER — CLONAZEPAM 1 MG PO TABS: 1.0000 mg | ORAL_TABLET | Freq: Two times a day (BID) | ORAL | 0 refills | Status: DC

## 2019-11-30 ENCOUNTER — Other Ambulatory Visit: Payer: Self-pay

## 2019-11-30 ENCOUNTER — Telehealth (INDEPENDENT_AMBULATORY_CARE_PROVIDER_SITE_OTHER): Payer: Medicaid Other | Admitting: Family Medicine

## 2019-11-30 DIAGNOSIS — L0291 Cutaneous abscess, unspecified: Secondary | ICD-10-CM | POA: Insufficient documentation

## 2019-11-30 NOTE — Progress Notes (Signed)
[facility] Telemedicine Visit  Patient consented to have virtual visit. Method of visit: Telephone  Encounter participants: Patient: [name] - located at in the car outside [facility] Provider: Arlyce Harman - located at [facility] Others (if applicable): none  Chief Complaint: painful bump on head  HPI:  Seen 3 weeks ago for a concern about a pustular bump on the back of her head. These began several weeks ago and I saw her on 12/8 and performed an I&D on one that was located on her arm. She was taking antibiotics at the time but it didn't seem to help. The bump on the back of her head was present at this time but small and not painful. She says now it is the size of a half-dollar and is so painful she can't go to sleep. Patient was in the check in area coughing a lot so her appointment was changed to a virtual one today.   After discussing her issue it seems likely she has another abscess that has not responded to antibiotics. Since patient has known COPD and just smoked a cigarette it seems likely this is the cause of her coughing. She has no known COVID exposure and no fever, chills, or any other COVID-like symptoms, just a dry cough.   I did inform her she could go to the ED and have the I&D performed tonight. She is scared of going to the ED. I did inform her in order to be seen in our clinic she could not be coughing when she arrived.  ROS: per HPI  Pertinent PMHx: Abscess on arm  Exam:  Respiratory: speaking in full sentences  Assessment/Plan:  Abscess I suspect patient has another abscess. She has taken over two weeks total of antibiotics and no improvement with the most recent suspect abscess on the back of her head getting worse. - Recommended patient go to ED to have I&D done tonight - Patient had virtual appointment with Dr. Linwood Dibbles tomorrow at 3:10pm. I changed it to an office visit and instructed her in order to be seen she could not be coughing when  she arrived.    Time spent during visit with patient: >20 minutes  Jules Schick, DO [facility], PGY-3

## 2019-11-30 NOTE — Assessment & Plan Note (Signed)
I suspect patient has another abscess. She has taken over two weeks total of antibiotics and no improvement with the most recent suspect abscess on the back of her head getting worse. - Recommended patient go to ED to have I&D done tonight - Patient had virtual appointment with Dr. Linwood Dibbles tomorrow at 3:10pm. I changed it to an office visit and instructed her in order to be seen she could not be coughing when she arrived.

## 2019-12-01 ENCOUNTER — Ambulatory Visit: Payer: Medicaid Other | Admitting: Family Medicine

## 2019-12-01 ENCOUNTER — Ambulatory Visit (INDEPENDENT_AMBULATORY_CARE_PROVIDER_SITE_OTHER): Payer: Medicaid Other | Admitting: Family Medicine

## 2019-12-01 VITALS — BP 122/70

## 2019-12-01 DIAGNOSIS — L0291 Cutaneous abscess, unspecified: Secondary | ICD-10-CM | POA: Diagnosis present

## 2019-12-01 DIAGNOSIS — Z23 Encounter for immunization: Secondary | ICD-10-CM

## 2019-12-01 MED ORDER — OXYCODONE HCL 7.5 MG PO TABS: 10.0000 | ORAL_TABLET | Freq: Three times a day (TID) | ORAL | 0 refills | Status: DC

## 2019-12-01 MED ORDER — OXYCODONE HCL 10 MG PO TABS: 10.0000 mg | ORAL_TABLET | Freq: Three times a day (TID) | ORAL | 0 refills | Status: DC | PRN

## 2019-12-01 MED ORDER — OXYCODONE HCL 7.5 MG PO TABS: 1.0000 | ORAL_TABLET | Freq: Three times a day (TID) | ORAL | 0 refills | Status: DC

## 2019-12-01 MED ORDER — SULFAMETHOXAZOLE-TRIMETHOPRIM 800-160 MG PO TABS: 1.0000 | ORAL_TABLET | Freq: Two times a day (BID) | ORAL | 0 refills | Status: AC

## 2019-12-01 NOTE — Patient Instructions (Addendum)
We will schedule an appointment with the dermatology clinic for the next available spot.  In the meantime I have prescribed an antibiotic I want you to take twice a day for ten days. I have also prescribed pain medication for you called oxycodone.  Take this every 8 hours as needed.  Continue to take your alleve but only take up to 2 tablets two times a day at most.  You can also take 1000mg  of tylenol 3 times a day as well.  Putting ice or something cold on the area may help with the pain.    The appointment for derm clinic is scheduled for tomorrow afternoon.  Please arrive 15 minutes early.    , MD

## 2019-12-02 ENCOUNTER — Other Ambulatory Visit: Payer: Self-pay

## 2019-12-02 ENCOUNTER — Ambulatory Visit (INDEPENDENT_AMBULATORY_CARE_PROVIDER_SITE_OTHER): Payer: Medicaid Other | Admitting: Family Medicine

## 2019-12-02 ENCOUNTER — Telehealth: Payer: Self-pay | Admitting: Family Medicine

## 2019-12-02 VITALS — BP 120/80 | HR 77 | Wt 144.4 lb

## 2019-12-02 DIAGNOSIS — L089 Local infection of the skin and subcutaneous tissue, unspecified: Secondary | ICD-10-CM | POA: Diagnosis present

## 2019-12-02 NOTE — Patient Instructions (Signed)
Thank you for coming in to see [place] today! Please see below to review our plan for today's visit:  1. Take your Clonopin in preparation for tomorrow's appt! 2. Place a warm wet wash cloth to the back of your neck to help to soften the bumps and allow them to drain a little. 3. Keep taking your antibiotic Bactrim twice daily as prescribed. 4. Cut back on or STOP SMOKING to allow these bumps to heal!  Please call the clinic at [phone] if your symptoms worsen or you have any concerns. It was our pleasure to serve you!  Dr. Peggyann Shoals [facility]

## 2019-12-02 NOTE — Telephone Encounter (Signed)
Can we call this pt to make sure she understands her appointment for derm clinic is Thursday, not today? I think I mistakenly told her it would be today.

## 2019-12-02 NOTE — Telephone Encounter (Signed)
Pt called in and made a ATC appt with Beaulah Corin for today and still has a derm appt for tomorrow. Sunday Spillers, CMA

## 2019-12-02 NOTE — Progress Notes (Signed)
   Subjective:    Patient ID: [name], female    DOB: February 25, 1971, 49 y.o.   MRN: [id number]   CC: scalp pain 2/2 abscesses  HPI: 10/20/2019: Patient was seen by Dr. Jules Schick for an abscess on her right forearm which he incised and drained, also prescribed her doxycycline.    10/21/2019: Patient seen by Dr. Lezlie Octave for abscesses on her scalp, some of which Dr. Frances Furbish was able to express by hand.  Patient instructed the time to continue doxycycline.  12/01/2019: Patient seen by Dr. Frederic Jericho who prescribed the patient Bactrim and oxycodone for pain, scheduled patient to follow-up in dermatology clinic 12/03/2019.  Today (12/02/2019): Patient presenting for severe pain due to abscesses/pustules on her scalp, has about 7 lesions of varying sizes, each with surrounding cellulitis. She reports she just started taking the bactrim last night (January 19) and had another one this morning.  Her primary concern is how painful the lesions are on her scalp, states she has been unable to sleep due to severe pain caused by resting her head on pillow.  She otherwise denies fevers, shortness of breath, chest pain, body aches, chills, nausea, vomiting, vomiting, and other rashes.  She is scheduled to be seen in [facility] tomorrow, December 03, 2019.  She reports she cannot wait until then to be seen due to pain.  Smoking status reviewed - current smoker  Review of Systems  -see HPI   Objective:  BP 120/80   Pulse 77   Wt 144 lb 6 oz (65.5 kg)   LMP  (LMP Unknown)   SpO2 94%   BMI 27.73 kg/m  Vitals and nursing note reviewed  Physical exam: General: Anxious, nontoxic appearing HEENT: 7-8 lesions with surrounding cellulitis appreciated to scalp (see photos below) Neck: No lymphadenopathy Cardiac: RRR, clear S1 and S2, no murmurs appreciated Respiratory: CTA bilaterally Skin: No other rashes appreciated, well-healed scar to right forearm appreciated from previous I&D of abscess        PRE-OP DIAGNOSIS: Scalp Abscess POST-OP DIAGNOSIS: Same  PROCEDURE: incision and drainage of abscess Performing Physician: Dr. Levert Feinstein, Dr. Peggyann Shoals Supervising Physician (if applicable): Dr. Levert Feinstein   PROCEDURE - I&D:  A timeout protocol was performed prior to initiating the procedure.  The area was prepared and draped in the usual, sterile manner. The site was anesthetized with 1% lidocaine without epinephrine. A linear incision along the local skin lines was made and the purulent material expressed. Bleeding was minimal. Due to severe anxiety and tearfulness, patient was unable to tolerate further I&D of other abscesses. The procedure was stopped. Packing: none   Followup: Standard post-procedure care is explained and return precautions are given.  Assessment & Plan:  Pustular inflammation of skin Patient continues to have pustules/abscesses to scalp, no exact etiology at this point. -Patient consented to incision and drainage of abscess, was only able to tolerate I&D of 1 abscess secondary to anxiety -Patient instructed to continue Bactrim twice daily as prescribed -Aleve 500 mg twice daily as needed for pain, Tylenol 500 mg every 6 as needed for pain -Patient to follow-up with dermatology clinic tomorrow 12/03/2019 -Strict return precautions given including fevers, chills, body aches, or other concerning symptoms.   Return in about 1 day (around 12/03/2019).   Dr. Peggyann Shoals [facility] Family Medicine, PGY-2

## 2019-12-03 ENCOUNTER — Ambulatory Visit (INDEPENDENT_AMBULATORY_CARE_PROVIDER_SITE_OTHER): Payer: Medicaid Other | Admitting: Family Medicine

## 2019-12-03 DIAGNOSIS — L0291 Cutaneous abscess, unspecified: Secondary | ICD-10-CM | POA: Diagnosis present

## 2019-12-03 MED ORDER — DICLOFENAC SODIUM 50 MG PO TBEC: 50.0000 mg | DELAYED_RELEASE_TABLET | Freq: Two times a day (BID) | ORAL | 0 refills | Status: DC

## 2019-12-03 NOTE — Assessment & Plan Note (Signed)
With multiple areas of folliculitis - improving on oral bactrim. No areas appear to need drainage.  No systemic symptoms.  Patient requested additional narcotic pain medications - discussed how these would be bad for her.  Rx for diclofenac short as needed course

## 2019-12-03 NOTE — Progress Notes (Signed)
   CHIEF COMPLAINT / HPI:  Follow up for skin abscesses on back of neck.  Still very painful but no fever or vomiting.  Taking antibiotics without problems.  No rash  PERTINENT  PMH / PSH: anxiety   OBJECTIVE: BP 115/70   Pulse 69   Wt 146 lb (66.2 kg)   LMP  (LMP Unknown)   SpO2 96%   BMI 28.04 kg/m   Back of neck all pustules look slightly smaller with decreased erythema.   The site of ID yesterday is clean dry without fluctuance  ASSESSMENT / PLAN:  Abscess With multiple areas of folliculitis - improving on oral bactrim. No areas appear to need drainage.  No systemic symptoms.  Patient requested additional narcotic pain medications - discussed how these would be bad for her.  Rx for diclofenac short as needed course      Carney Living, MD [facility]

## 2019-12-03 NOTE — Patient Instructions (Addendum)
Good to see you today!  Thanks for coming in.  Your infection is definitely getting better  Use tylenol up to 4 x a day for pain  If this is not enough you can take diclofenac 50 mg twice a day as needed with food in your stomach.  Stop this medication as soon as your pain  Keep using warm compresses  Finish all the antibiotics   If any high fevers or persistent vomiting then call us   See Dr Frances Furbish in 2 weeks for a follow up

## 2019-12-04 ENCOUNTER — Telehealth: Payer: Self-pay | Admitting: *Deleted

## 2019-12-04 MED ORDER — SULINDAC 150 MG PO TABS: 150.0000 mg | ORAL_TABLET | Freq: Two times a day (BID) | ORAL | 0 refills | Status: DC

## 2019-12-04 NOTE — Telephone Encounter (Signed)
subsituted clinoril for diclofenac

## 2019-12-04 NOTE — Telephone Encounter (Signed)
Diclofenac tablet not covered by Medicaid.  Please see below of formulary.  Let "RN Team" know if you are changing to covered medications or would like to pursue a PA. Jone Baseman, CMA

## 2019-12-05 NOTE — Assessment & Plan Note (Signed)
Multiple lesions on the back of the neck near the hairline indicating likely a folliculitis that became infected, although it is strange that patient reported these first appearing at the same time as a lesion on her forearm which is largely hairless.  Patient appears to be in exquisite pain and is very tender to palpation.  Patient took 4 times the recommended daily dose of Aleve yesterday but I am concerned that she will continue to take dangerous amounts of over-the-counter pain medications if not prescribe something stronger. -Bactrim for 7 days at least.  Reevaluate at end of course. -Make patient appointment for Derm clinic in 2 days for possible incision and drainage if not improving on antibiotics -Oxycodone 10 mg as needed every 8 hours (patient's pharmacy did not have 7.5 mg which is what I initially prescribed.)

## 2019-12-05 NOTE — Progress Notes (Signed)
   [facility] Phone: [phone]     [name] - 49 y.o. female MRN [id number]  Date of birth: 1971-06-29  Subjective:   cc: Skin abscess  HPI:  Skin abscess: Patient was previously seen for a skin abscess on her forearm that appeared after she was working in her yard approximately 5 weeks ago.  Abscess on her forearm was incised and drained and has since resolved.  During this time she also had what appeared to be smaller abscesses on the back of her neck but these were not bothering her and they would not drain or express purulent discharge when pressed.  In the past week ago the patient's lesions on her neck had become exquisitely painful, so much so that she has not slept in the past 5 days.  She has tried Tylenol and Aleve.  She took 8 Aleve yesterday in attempt to relieve the pain.  She has tried expressing the contents with no success.  ROS: See HPI for pertinent positives and negatives  Family history reviewed for today's visit. No changes.  -  reports that she has been smoking cigarettes. She has a 15.00 pack-year smoking history. She has never used smokeless tobacco.  Objective:   BP 122/70   LMP  (LMP Unknown)  Gen: Alert and oriented.  Mild to moderate amount of pain.  Appears older than her stated age. Neck: On the posterior neck near the hairline the patient has approximately fourth left small raised pustules with a central eschar and surrounding erythema.  There is also a larger oval-shaped mass above the hairline on the right side approximately 1 cm high by 2 cm wide with  eschar.  It is firm, not fluctuant.  It is erythematous and tender to palpation. Psych: Patient became tearful during visit when she was told we would not be able to incise it today.      Assessment/Plan:   Abscess Multiple lesions on the back of the neck near the hairline indicating likely a folliculitis that became infected, although it is strange that patient  reported these first appearing at the same time as a lesion on her forearm which is largely hairless.  Patient appears to be in exquisite pain and is very tender to palpation.  Patient took 4 times the recommended daily dose of Aleve yesterday but I am concerned that she will continue to take dangerous amounts of over-the-counter pain medications if not prescribe something stronger. -Bactrim for 7 days at least.  Reevaluate at end of course. -Make patient appointment for Derm clinic in 2 days for possible incision and drainage if not improving on antibiotics -Oxycodone 10 mg as needed every 8 hours (patient's pharmacy did not have 7.5 mg which is what I initially prescribed.)   Frederic Jericho, MD PGY-2 [facility]

## 2019-12-11 ENCOUNTER — Other Ambulatory Visit: Payer: Self-pay | Admitting: Family Medicine

## 2019-12-13 NOTE — Assessment & Plan Note (Signed)
Patient continues to have pustules/abscesses to scalp, no exact etiology at this point. -Patient consented to incision and drainage of abscess, was only able to tolerate I&D of 1 abscess secondary to anxiety -Patient instructed to continue Bactrim twice daily as prescribed -Aleve 500 mg twice daily as needed for pain, Tylenol 500 mg every 6 as needed for pain -Patient to follow-up with dermatology clinic tomorrow 12/03/2019 -Strict return precautions given including fevers, chills, body aches, or other concerning symptoms.

## 2019-12-17 ENCOUNTER — Ambulatory Visit: Payer: Medicaid Other

## 2019-12-19 ENCOUNTER — Other Ambulatory Visit: Payer: Self-pay | Admitting: Family Medicine

## 2019-12-21 ENCOUNTER — Ambulatory Visit: Payer: Medicaid Other | Admitting: Family Medicine

## 2019-12-24 ENCOUNTER — Ambulatory Visit: Payer: Medicaid Other | Admitting: Family Medicine

## 2019-12-24 ENCOUNTER — Other Ambulatory Visit: Payer: Self-pay

## 2019-12-24 ENCOUNTER — Telehealth (INDEPENDENT_AMBULATORY_CARE_PROVIDER_SITE_OTHER): Payer: Medicaid Other | Admitting: Family Medicine

## 2019-12-24 DIAGNOSIS — L0291 Cutaneous abscess, unspecified: Secondary | ICD-10-CM | POA: Diagnosis present

## 2019-12-24 MED ORDER — LIDOCAINE 5 % EX PTCH: 1.0000 | MEDICATED_PATCH | CUTANEOUS | 0 refills | Status: DC

## 2019-12-24 MED ORDER — MELOXICAM 15 MG PO TABS: 15.0000 mg | ORAL_TABLET | Freq: Every day | ORAL | 0 refills | Status: DC

## 2019-12-24 MED ORDER — MUPIROCIN 2 % EX OINT: 1.0000 "application " | TOPICAL_OINTMENT | Freq: Three times a day (TID) | CUTANEOUS | 0 refills | Status: AC

## 2019-12-24 NOTE — Progress Notes (Addendum)
[facility] Telemedicine Visit  Patient consented to have virtual visit. Method of visit: Video  Encounter participants: Patient: [name] - located in her car Provider: Allayne Stack - located at [facility] Others (if applicable): Fianc  Chief Complaint: Folliculitis/abscesses  HPI: [name] is a 49 year old female presenting to discuss the following:  Folliculitis/abscess follow-up: Has been seen for folliculitis within her scalp on numerous occasions, initially appeared in the beginning of 10/2019.  Previously was on doxycycline for an abscess of her right forearm at the beginning of December, did not seem to make any change in her scalp lesions.  Seen by Dr. Constance Goltz on 1/19 and prescribed Bactrim.  Subsequently had an I&D of one of the small abscesses on 1/20, showed continued improvement on 1/21 during follow-up within the dermatology clinic.  Endorses significant improvement in these areas after I&D and the use of Bactrim.  She is hoping to receive 1 more prescription of Bactrim to "knock it all out."  She is very excited that everything seems to be doing much better in terms of pain and appearance, endorses that many areas scabbed up and fell off.  She has one area that seem to be unchanged in size following Bactrim, however denies any surrounding erythema.  No fever.  Nearing the end of our conversation, she requested increased pain control due to excruciating pain, especially at night.  Reports frustration with opioid pandemic limiting opiates. She has been using Tylenol and sulindac for pain relief.  Received oxycodone 10 mg tablets for 10 doses on 1/19.   ROS: per HPI  Pertinent PMHx: COPD, bipolar 1 disorder, anxiety, tobacco use, previous history of cocaine/MJ use, chronic pain syndrome, Graves' disease s/p ablation with hypothyroidism.   Exam:  General: NAD Respiratory: Unlabored breathing, speaking in full sentences Skin: Difficult to examine due to  video quality, however could appreciate 1-2 small pea sized nonerythematous nodules on her posterior neck around end of hairline. Additionally try to show 1-2 abscess/healing nodules within scalp, did not see well.  No drainage appreciated.  Assessment/Plan:  Abscess Suspected scalp folliculitis, substantial improvement since Bactrim at the end of January 2020 with few residual nodules.  Poor visualization of lesions today via video, however will trial a 5-day course of topical Bactroban TID to mitigate any residual inflammation remaining.  As she reports one small abscess that has not decreased in size since abx with mild tenderness, discussed trialing warm compresses TID, if no spontaneous drain may need to follow-up in the next week or so for an I&D.  Additionally, sent in 10-day course of meloxicam to use concurrently with a lidocaine patch, ice, and Tylenol for pain relief. Believe she would benefit from a dermatology referral if she continues to struggle with recurrent scalp abscesses.    Discussed that unfortunately there currently is no indication to prescribe opioids for her scalp abscesses that have substantially improved since onset.  Provided supportive listening and understand that this has been quite a frustrating journey for her. Sent in meloxicam and lidocaine patch as discussed above to help with pain and inflammation, encouraged continued use of tylenol up to 4g daily with ice as needed.  Follow-up in 1-2 weeks pending continued resolution of scalp regions, may need additional I&D.   Time spent during visit with patient: 25 minutes   Allayne Stack, DO  Family Medicine PGY-2

## 2019-12-25 ENCOUNTER — Telehealth: Payer: Self-pay | Admitting: *Deleted

## 2019-12-25 ENCOUNTER — Other Ambulatory Visit: Payer: Self-pay | Admitting: Family Medicine

## 2019-12-25 DIAGNOSIS — L0291 Cutaneous abscess, unspecified: Secondary | ICD-10-CM

## 2019-12-25 MED ORDER — DICLOFENAC SODIUM 1 % EX GEL: 4.0000 g | Freq: Four times a day (QID) | CUTANEOUS | 0 refills | Status: DC | PRN

## 2019-12-25 NOTE — Telephone Encounter (Signed)
Will send in diclofenac gel, please let patient know. Thank you!   Best, Dr Annia Friendly

## 2019-12-25 NOTE — Telephone Encounter (Signed)
PA request from Pharmacy for lidocaine patches.  Upon review of Medicaid criteria pt would not qualify (see below and will need to purchase OTC.  Attempted to call pt, no answer.  Informed pharmacy. Jone Baseman, CMA

## 2019-12-25 NOTE — Assessment & Plan Note (Addendum)
Suspected scalp folliculitis, substantial improvement since Bactrim at the end of January 2020 with few residual nodules.  Poor visualization of lesions today via video, however will trial a 5-day course of topical Bactroban TID to mitigate any residual inflammation remaining.  As she reports one small abscess that has not decreased in size since abx with mild tenderness, discussed trialing warm compresses TID, if no spontaneous drain may need to follow-up in the next week or so for an I&D.  Additionally, sent in 10-day course of meloxicam to use concurrently with a lidocaine patch, ice, and Tylenol for pain relief. Believe she would benefit from a dermatology referral if she continues to struggle with recurrent scalp abscesses.

## 2019-12-28 ENCOUNTER — Other Ambulatory Visit: Payer: Self-pay | Admitting: Family Medicine

## 2019-12-28 NOTE — Telephone Encounter (Signed)
Called patient to inform her of diclofenac at pharmacy. Patient does not want to pick it up because it does not work for her.  Patient states that she needs the antibiotic that she had last time.  She is not sure what it was called.   Patient states that it was in pill format and she states that it worked wonders.  Patient was given an option to come in or have a virtual appointment. Patient states that she has been here four times for the same issue of bumps in her head.  Right now she just wants the antibiotic because she is in a lot of pain.  Please advise.  Glennie Hawk, CMA

## 2019-12-28 NOTE — Telephone Encounter (Signed)
Please let [name] know that abscesses need to be drained in order to heal, so sending in an antibiotic without draining them will not be the appropriate treatment.  She can see any provider at our clinic to have these reevaluated.

## 2019-12-30 ENCOUNTER — Ambulatory Visit: Payer: Medicaid Other

## 2019-12-30 ENCOUNTER — Other Ambulatory Visit: Payer: Self-pay | Admitting: Family Medicine

## 2020-01-01 ENCOUNTER — Ambulatory Visit: Payer: Medicaid Other | Admitting: Family Medicine

## 2020-01-01 NOTE — Progress Notes (Deleted)
   CHIEF COMPLAINT / HPI:  I+D of scalp abscess   PERTINENT  PMH / PSH: ***   OBJECTIVE: LMP  (LMP Unknown)   ***  ASSESSMENT / PLAN:  No problem-specific Assessment & Plan notes found for this encounter.     Mirian Mo, MD [facility]

## 2020-01-01 NOTE — Telephone Encounter (Signed)
Pt has appointment for this on 01/04/20. Reva Pinkley Zimmerman Rumple, CMA

## 2020-01-04 ENCOUNTER — Ambulatory Visit: Payer: Medicaid Other

## 2020-01-11 ENCOUNTER — Other Ambulatory Visit: Payer: Self-pay | Admitting: Family Medicine

## 2020-01-16 ENCOUNTER — Other Ambulatory Visit: Payer: Self-pay | Admitting: Family Medicine

## 2020-01-30 ENCOUNTER — Other Ambulatory Visit: Payer: Self-pay | Admitting: Family Medicine

## 2020-01-30 DIAGNOSIS — E039 Hypothyroidism, unspecified: Secondary | ICD-10-CM

## 2020-02-15 ENCOUNTER — Other Ambulatory Visit: Payer: Self-pay | Admitting: Family Medicine

## 2020-02-17 NOTE — Telephone Encounter (Signed)
Patient calls nurse line regarding clonazepam refill. Patient states she is planning to go out of town tomorrow and was needing to pick up rx today if possible. Patient's brother just passed away unexpectedly so patient is wanting to go out of town for a few days.   To PCP  Veronda Prude, RN

## 2020-03-08 ENCOUNTER — Encounter (HOSPITAL_COMMUNITY): Payer: Self-pay

## 2020-03-08 ENCOUNTER — Telehealth (INDEPENDENT_AMBULATORY_CARE_PROVIDER_SITE_OTHER): Payer: Medicaid Other | Admitting: Family Medicine

## 2020-03-08 ENCOUNTER — Other Ambulatory Visit: Payer: Self-pay

## 2020-03-08 ENCOUNTER — Ambulatory Visit (HOSPITAL_COMMUNITY)
Admission: EM | Admit: 2020-03-08 | Discharge: 2020-03-08 | Disposition: A | Discharge status: Home / Self Care | Payer: Medicaid Other | Attending: Family Medicine | Admitting: Family Medicine

## 2020-03-08 DIAGNOSIS — F419 Anxiety disorder, unspecified: Secondary | ICD-10-CM | POA: Diagnosis not present

## 2020-03-08 DIAGNOSIS — E039 Hypothyroidism, unspecified: Secondary | ICD-10-CM | POA: Diagnosis not present

## 2020-03-08 DIAGNOSIS — K1379 Other lesions of oral mucosa: Secondary | ICD-10-CM | POA: Insufficient documentation

## 2020-03-08 DIAGNOSIS — E0500 Thyrotoxicosis with diffuse goiter without thyrotoxic crisis or storm: Secondary | ICD-10-CM | POA: Diagnosis not present

## 2020-03-08 DIAGNOSIS — Z791 Long term (current) use of non-steroidal anti-inflammatories (NSAID): Secondary | ICD-10-CM | POA: Insufficient documentation

## 2020-03-08 DIAGNOSIS — Z79899 Other long term (current) drug therapy: Secondary | ICD-10-CM | POA: Diagnosis not present

## 2020-03-08 DIAGNOSIS — F17210 Nicotine dependence, cigarettes, uncomplicated: Secondary | ICD-10-CM | POA: Insufficient documentation

## 2020-03-08 DIAGNOSIS — G894 Chronic pain syndrome: Secondary | ICD-10-CM | POA: Insufficient documentation

## 2020-03-08 DIAGNOSIS — J019 Acute sinusitis, unspecified: Secondary | ICD-10-CM

## 2020-03-08 DIAGNOSIS — F319 Bipolar disorder, unspecified: Secondary | ICD-10-CM | POA: Diagnosis not present

## 2020-03-08 DIAGNOSIS — Z20822 Contact with and (suspected) exposure to covid-19: Secondary | ICD-10-CM | POA: Insufficient documentation

## 2020-03-08 MED ORDER — OXYCODONE-ACETAMINOPHEN 7.5-325 MG PO TABS: 1.0000 | ORAL_TABLET | ORAL | 0 refills | Status: DC | PRN

## 2020-03-08 MED ORDER — PREDNISONE 20 MG PO TABS: 20.0000 mg | ORAL_TABLET | Freq: Two times a day (BID) | ORAL | 0 refills | Status: DC

## 2020-03-08 MED ORDER — DOXYCYCLINE HYCLATE 100 MG PO CAPS
100.0000 mg | ORAL_CAPSULE | Freq: Two times a day (BID) | ORAL | 0 refills | Status: DC
Start: 2020-03-08 — End: 2020-08-26

## 2020-03-08 NOTE — ED Triage Notes (Signed)
Patient reports sinus issues for a while. She tried using a OTC nasal spray from [employer]. She now has sores/scabs in both nares and nasal swelling. Reports headaches as well.
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
## 2020-03-08 NOTE — Discharge Instructions (Addendum)
Take prednisone 2 times a day Take antibiotic 2 times a day with food Take pain medicine as needed Do not take pain medicine with clonazepam Do not take pain medicine and drive Drink plenty of fluids May use a saltwater nasal spray or rinse Follow-up with your primary care doctor

## 2020-03-08 NOTE — Assessment & Plan Note (Signed)
Given patient's complaint of skin irritation in her mouth being so severe that she was unable to put in her dentures, I did describe to her that she needed to be evaluated in person but that given her complaints of cold symptoms I was unable to bring her to our clinic.  We discussed the risk versus benefits of the emergency department versus urgent care and she adamantly refused that she was sick enough to require the emergency department at this time so she agreed to go to the urgent care which she said was just a few miles down her house.  She definitely did say that she did not need an ambulance and would be fine to get herself there.

## 2020-03-08 NOTE — Progress Notes (Signed)
[facility] Telemedicine Visit  Patient consented to have virtual visit and was identified by name and date of birth. Method of visit: Telephone  Encounter participants: Patient: [name] - located at home Provider: Marthenia Rolling - located at [facility]  Chief Complaint: nose/mouth irritation  HPI:  Can breath from mouth just fine but is insistent that her nose is swollen and she can't breath through it.  She also says the top of her mouth is "burning" and hurts so bad that she can't put her dentures in.  She thinks this is most likely since she started using an unnamed sinus spray a few days.  ROS: per HPI  Pertinent PMHx: hx of allergic rhinitis  Exam:  LMP  (LMP Unknown)   Respiratory: Speaking in full and complete sentences on the phone, no cough, no stridor or indication of distress in voice.  Assessment/Plan:  Oral soft tissue complaint Given patient's complaint of skin irritation in her mouth being so severe that she was unable to put in her dentures, I did describe to her that she needed to be evaluated in person but that given her complaints of cold symptoms I was unable to bring her to our clinic.  We discussed the risk versus benefits of the emergency department versus urgent care and she adamantly refused that she was sick enough to require the emergency department at this time so she agreed to go to the urgent care which she said was just a few miles down her house.  She definitely did say that she did not need an ambulance and would be fine to get herself there.    Time spent during visit with patient: 10 minutes

## 2020-03-09 LAB — SARS CORONAVIRUS 2 (TAT 6-24 HRS): SARS Coronavirus 2: NEGATIVE

## 2020-03-17 ENCOUNTER — Other Ambulatory Visit: Payer: Self-pay | Admitting: Family Medicine

## 2020-03-21 ENCOUNTER — Other Ambulatory Visit: Payer: Self-pay

## 2020-03-21 ENCOUNTER — Telehealth (INDEPENDENT_AMBULATORY_CARE_PROVIDER_SITE_OTHER): Payer: Medicaid Other | Admitting: Family Medicine

## 2020-03-21 DIAGNOSIS — J019 Acute sinusitis, unspecified: Secondary | ICD-10-CM | POA: Diagnosis present

## 2020-03-21 MED ORDER — FLUTICASONE PROPIONATE 50 MCG/ACT NA SUSP: 2.0000 | Freq: Every day | NASAL | 6 refills | Status: DC

## 2020-03-21 NOTE — Progress Notes (Signed)
 [facility]  Patient consented to have virtual visit and was identified by name and date of birth. Method of visit: Video was attempted, but technology challenges prevented patient from using video, so visit was conducted via telephone.  Encounter participants: Patient: [name] - located at home Provider: Lavonda Jumbo - located at [facility] Others (if applicable): N/A  Chief Complaint: Sinus pressure  HPI:  Sinus pressure [name] is a 49 yo F who is presenting with continued sinus pressure and headache s/p treatment for bacterial sinusitits. She was seen in the ED 03/08/2020 for acute sinusitis and given a 7 day doxycycline course along with prednisone. She felt better for a could days after completing the course but continues to have sinus pressure. She endorses mouth breathing. She denies drainage and fever. She is taking tylenol for her headache.   ROS: per HPI  Pertinent PMHx: Asthma, allergic rhinitis, PUD  Exam:  Respiratory: Audibly congested. Speaking in full sentences.   Assessment/Plan:  Acute non-recurrent sinusitis S/p 7 day course of antibiotics and prednisone. Did not have complete resolution of symptoms but feels better. Will likely need time to recover but will consider recurrent sinusitis. For relief encouraged to use flonase and continue tylenol. Patient unable to use NSAIDs to due PUD hx. Will obtain xray to further investigate.  -Start flonase -Obtain sinus films -Consider need for referral -Follow up if symptoms fail to improve over the next week    Time spent during visit with patient: 10 minutes

## 2020-03-21 NOTE — Assessment & Plan Note (Signed)
S/p 7 day course of antibiotics and prednisone. Did not have complete resolution of symptoms but feels better. Will likely need time to recover but will consider recurrent sinusitis. For relief encouraged to use flonase and continue tylenol. Patient unable to use NSAIDs to due PUD hx. Will obtain xray to further investigate.  -Start flonase -Obtain sinus films -Consider need for referral -Follow up if symptoms fail to improve over the next week

## 2020-03-22 ENCOUNTER — Other Ambulatory Visit: Payer: Self-pay | Admitting: Internal Medicine

## 2020-03-24 ENCOUNTER — Telehealth: Payer: Self-pay | Admitting: Internal Medicine

## 2020-03-24 MED ORDER — STIOLTO RESPIMAT 2.5-2.5 MCG/ACT IN AERS: 2.0000 | INHALATION_SPRAY | Freq: Every day | RESPIRATORY_TRACT | 5 refills | Status: DC

## 2020-03-24 NOTE — Telephone Encounter (Signed)
LMOM for pt that refill for Stiolto was sent to [facility]. Nothing further needed at this time.

## 2020-03-28 ENCOUNTER — Other Ambulatory Visit: Payer: Self-pay

## 2020-03-28 ENCOUNTER — Ambulatory Visit (HOSPITAL_COMMUNITY)
Admission: RE | Admit: 2020-03-28 | Discharge: 2020-03-28 | Disposition: A | Discharge status: Home / Self Care | Payer: Medicaid Other | Source: Ambulatory Visit | Attending: Family Medicine | Admitting: Family Medicine

## 2020-03-28 DIAGNOSIS — J019 Acute sinusitis, unspecified: Secondary | ICD-10-CM | POA: Insufficient documentation

## 2020-04-07 ENCOUNTER — Other Ambulatory Visit: Payer: Self-pay | Admitting: Internal Medicine

## 2020-04-07 DIAGNOSIS — J441 Chronic obstructive pulmonary disease with (acute) exacerbation: Secondary | ICD-10-CM

## 2020-04-12 ENCOUNTER — Other Ambulatory Visit: Payer: Self-pay | Admitting: Family Medicine

## 2020-04-15 ENCOUNTER — Other Ambulatory Visit: Payer: Self-pay | Admitting: Family Medicine

## 2020-04-15 MED ORDER — CLONAZEPAM 1 MG PO TABS: ORAL_TABLET | ORAL | 0 refills | Status: DC

## 2020-04-15 NOTE — Telephone Encounter (Signed)
Pt called because she needs to have her anxiety medication clonazepam called in today before pm. She forgot to call earlier to get a refill. She will be going out of town today, she is leaving at 6:30. Can we please get the called in before 5 pm since her pharmacy also closes early. jw

## 2020-05-04 ENCOUNTER — Other Ambulatory Visit: Payer: Self-pay | Admitting: Internal Medicine

## 2020-05-04 ENCOUNTER — Other Ambulatory Visit: Payer: Self-pay | Admitting: Family Medicine

## 2020-05-04 DIAGNOSIS — J441 Chronic obstructive pulmonary disease with (acute) exacerbation: Secondary | ICD-10-CM

## 2020-05-04 DIAGNOSIS — E039 Hypothyroidism, unspecified: Secondary | ICD-10-CM

## 2020-05-17 ENCOUNTER — Other Ambulatory Visit: Payer: Self-pay | Admitting: Family Medicine

## 2020-05-17 MED ORDER — CLONAZEPAM 1 MG PO TABS: ORAL_TABLET | ORAL | 0 refills | Status: DC

## 2020-05-17 MED ORDER — SERTRALINE HCL 50 MG PO TABS: 50.0000 mg | ORAL_TABLET | Freq: Every day | ORAL | 3 refills | Status: DC

## 2020-05-17 NOTE — Telephone Encounter (Signed)
Refilled medication form Zoloft and Klonopin until she is able to see her psychiatrist in August to take over management of medications.  Thank you  Dana Allan, MD [facility]

## 2020-05-17 NOTE — Telephone Encounter (Signed)
Patient is calling to inform Dr. Clent Ridges that she was able to get scheduled with her psychiatrist. The appointment will be 06/21/20.

## 2020-06-03 ENCOUNTER — Other Ambulatory Visit: Payer: Self-pay

## 2020-06-03 DIAGNOSIS — J019 Acute sinusitis, unspecified: Secondary | ICD-10-CM

## 2020-06-03 MED ORDER — FLUTICASONE PROPIONATE 50 MCG/ACT NA SUSP: 2.0000 | Freq: Every day | NASAL | 6 refills | Status: DC

## 2020-06-06 ENCOUNTER — Other Ambulatory Visit: Payer: Self-pay | Admitting: Internal Medicine

## 2020-06-06 DIAGNOSIS — J441 Chronic obstructive pulmonary disease with (acute) exacerbation: Secondary | ICD-10-CM

## 2020-06-20 ENCOUNTER — Ambulatory Visit: Payer: Medicaid Other | Admitting: Family Medicine

## 2020-06-28 ENCOUNTER — Other Ambulatory Visit: Payer: Self-pay | Admitting: Internal Medicine

## 2020-06-28 DIAGNOSIS — J441 Chronic obstructive pulmonary disease with (acute) exacerbation: Secondary | ICD-10-CM

## 2020-06-30 ENCOUNTER — Other Ambulatory Visit: Payer: Self-pay | Admitting: *Deleted

## 2020-06-30 DIAGNOSIS — J441 Chronic obstructive pulmonary disease with (acute) exacerbation: Secondary | ICD-10-CM

## 2020-07-01 MED ORDER — ALBUTEROL SULFATE 2.5 MG/0.5ML IN NEBU: INHALATION_SOLUTION | RESPIRATORY_TRACT | 1 refills | Status: DC

## 2020-07-26 ENCOUNTER — Other Ambulatory Visit: Payer: Self-pay | Admitting: *Deleted

## 2020-07-26 DIAGNOSIS — E039 Hypothyroidism, unspecified: Secondary | ICD-10-CM

## 2020-07-26 MED ORDER — LEVOTHYROXINE SODIUM 125 MCG PO TABS: 125.0000 ug | ORAL_TABLET | Freq: Every day | ORAL | 3 refills | Status: DC

## 2020-08-18 ENCOUNTER — Other Ambulatory Visit: Payer: Self-pay | Admitting: Internal Medicine

## 2020-08-24 ENCOUNTER — Ambulatory Visit (HOSPITAL_COMMUNITY)
Admission: EM | Admit: 2020-08-24 | Discharge: 2020-08-24 | Discharge status: Against Medical Advice | Payer: Medicaid Other

## 2020-08-24 ENCOUNTER — Other Ambulatory Visit: Payer: Self-pay

## 2020-08-26 ENCOUNTER — Ambulatory Visit (HOSPITAL_COMMUNITY)
Admission: EM | Admit: 2020-08-26 | Discharge: 2020-08-26 | Disposition: A | Discharge status: Home / Self Care | Payer: Medicaid Other | Attending: Family Medicine | Admitting: Family Medicine

## 2020-08-26 ENCOUNTER — Encounter (HOSPITAL_COMMUNITY): Payer: Self-pay

## 2020-08-26 ENCOUNTER — Other Ambulatory Visit: Payer: Self-pay

## 2020-08-26 DIAGNOSIS — L03317 Cellulitis of buttock: Secondary | ICD-10-CM | POA: Diagnosis not present

## 2020-08-26 MED ORDER — SULFAMETHOXAZOLE-TRIMETHOPRIM 800-160 MG PO TABS: 1.0000 | ORAL_TABLET | Freq: Two times a day (BID) | ORAL | 0 refills | Status: AC

## 2020-08-26 MED ORDER — OXYCODONE-ACETAMINOPHEN 7.5-325 MG PO TABS: 1.0000 | ORAL_TABLET | Freq: Four times a day (QID) | ORAL | 0 refills | Status: DC | PRN
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
## 2020-08-26 NOTE — Discharge Instructions (Addendum)
Warm compresses to area.  Do this for 20 minutes every couple of hours Take the antibiotic 2 times a day.  Start with a double dose tonight at bedtime (2 tablets).  Take with food Take the oxycodone as needed for severe pain.  Do not take this and drive.  This will not be refilled Follow-up with your family practice center

## 2020-08-26 NOTE — ED Triage Notes (Signed)
Pt present a insect bite on her buttocks. The area is red with slightly swelling and warm to touch. Pt states that the pain is unbearable

## 2020-09-07 ENCOUNTER — Ambulatory Visit: Payer: Medicaid Other | Admitting: Family Medicine

## 2020-09-12 ENCOUNTER — Other Ambulatory Visit: Payer: Self-pay | Admitting: Internal Medicine

## 2020-09-12 ENCOUNTER — Ambulatory Visit: Payer: Medicaid Other | Admitting: Family Medicine

## 2020-09-12 NOTE — Progress Notes (Deleted)
    SUBJECTIVE:   CHIEF COMPLAINT / HPI:   Follow-up cellulitis Patient is seen at urgent care on 10/15 for possible spider bite on her buttocks Was diagnosed with a large area of cellulitis with early abscess suspected She was given Bactrim to use twice daily for 7 days ***    PERTINENT  PMH / PSH: ***  OBJECTIVE:   LMP  (LMP Unknown)    Physical Exam: *** General: 49 y.o. female in NAD Cardio: RRR no m/r/g Lungs: CTAB, no wheezing, no rhonchi, no crackles, no IWOB on *** Abdomen: Soft, non-tender to palpation, non-distended, positive bowel sounds Skin: warm and dry Extremities: No edema   ASSESSMENT/PLAN:   No problem-specific Assessment & Plan notes found for this encounter.     Unknown Jim, DO [facility]

## 2020-09-13 ENCOUNTER — Other Ambulatory Visit: Payer: Self-pay | Admitting: Internal Medicine

## 2020-09-15 ENCOUNTER — Other Ambulatory Visit: Payer: Self-pay | Admitting: Internal Medicine

## 2020-09-15 DIAGNOSIS — J441 Chronic obstructive pulmonary disease with (acute) exacerbation: Secondary | ICD-10-CM

## 2020-09-19 ENCOUNTER — Ambulatory Visit: Payer: Medicaid Other | Admitting: Family Medicine

## 2020-09-19 NOTE — Progress Notes (Deleted)
    SUBJECTIVE:   CHIEF COMPLAINT / HPI:   Pain management referral ***  F/U Patient was seen in UC on 10/15 Diagnosed with cellulitis of the left buttocks She was given Bactrim to use for 7 days ***  PERTINENT  PMH / PSH: Asthma, COPD, Graves' disease, bipolar 1, history of cocaine and marijuana abuse  OBJECTIVE:   LMP  (LMP Unknown)    Physical Exam: *** General: 49 y.o. female in NAD Cardio: RRR no m/r/g Lungs: CTAB, no wheezing, no rhonchi, no crackles, no IWOB on *** Abdomen: Soft, non-tender to palpation, non-distended, positive bowel sounds Skin: warm and dry Extremities: No edema   ASSESSMENT/PLAN:   No problem-specific Assessment & Plan notes found for this encounter.     Unknown Jim, DO [facility] Center

## 2020-09-22 ENCOUNTER — Ambulatory Visit: Payer: Medicaid Other | Admitting: Family Medicine

## 2020-09-22 NOTE — Progress Notes (Deleted)
    SUBJECTIVE:   CHIEF COMPLAINT / HPI:   F/U cellulitis Patient was diagnosed with cellulitis of the leg buttocks on 10/15 at urgent care She was discharged on Bactrim for 7 days ***  PERTINENT  PMH / PSH: ***  OBJECTIVE:   LMP  (LMP Unknown)    Physical Exam: *** General: 49 y.o. female in NAD Cardio: RRR no m/r/g Lungs: CTAB, no wheezing, no rhonchi, no crackles, no IWOB on *** Abdomen: Soft, non-tender to palpation, non-distended, positive bowel sounds Skin: warm and dry Extremities: No edema   ASSESSMENT/PLAN:   No problem-specific Assessment & Plan notes found for this encounter.     Unknown Jim, DO [facility] Medicine Center

## 2020-09-26 ENCOUNTER — Ambulatory Visit: Payer: Medicaid Other

## 2020-09-28 NOTE — Progress Notes (Deleted)
° ° °  SUBJECTIVE:   CHIEF COMPLAINT / HPI:   ***  PERTINENT  PMH / PSH: ***  OBJECTIVE:   LMP  (LMP Unknown)   ***  ASSESSMENT/PLAN:   No problem-specific Assessment & Plan notes found for this encounter.     Dana Allan, MD [facility]

## 2020-10-10 ENCOUNTER — Other Ambulatory Visit: Payer: Self-pay | Admitting: Internal Medicine

## 2020-10-10 ENCOUNTER — Ambulatory Visit: Payer: Medicaid Other | Admitting: Family Medicine

## 2020-10-10 DIAGNOSIS — J441 Chronic obstructive pulmonary disease with (acute) exacerbation: Secondary | ICD-10-CM

## 2020-10-11 ENCOUNTER — Telehealth: Payer: Self-pay | Admitting: Internal Medicine

## 2020-10-11 DIAGNOSIS — J441 Chronic obstructive pulmonary disease with (acute) exacerbation: Secondary | ICD-10-CM

## 2020-10-11 MED ORDER — STIOLTO RESPIMAT 2.5-2.5 MCG/ACT IN AERS: 2.0000 | INHALATION_SPRAY | Freq: Every day | RESPIRATORY_TRACT | 6 refills | Status: DC

## 2020-10-11 MED ORDER — PROAIR HFA 108 (90 BASE) MCG/ACT IN AERS: INHALATION_SPRAY | RESPIRATORY_TRACT | 3 refills | Status: DC

## 2020-10-11 NOTE — Telephone Encounter (Signed)
Called and spoke with patient who needs refills of proair and Stiolto. She verified pharmacy. RX sent. Advised patient to keep upcoming appointment. Nothing further needed.

## 2020-10-17 ENCOUNTER — Ambulatory Visit: Payer: Medicaid Other | Admitting: Pulmonary Disease

## 2020-10-29 ENCOUNTER — Encounter (HOSPITAL_COMMUNITY): Payer: Self-pay

## 2020-10-29 ENCOUNTER — Ambulatory Visit (HOSPITAL_COMMUNITY)
Admission: EM | Admit: 2020-10-29 | Discharge: 2020-10-29 | Disposition: A | Discharge status: Home / Self Care | Payer: Medicaid Other | Attending: Family Medicine | Admitting: Family Medicine

## 2020-10-29 ENCOUNTER — Other Ambulatory Visit: Payer: Self-pay

## 2020-10-29 DIAGNOSIS — Z20822 Contact with and (suspected) exposure to covid-19: Secondary | ICD-10-CM | POA: Insufficient documentation

## 2020-10-29 DIAGNOSIS — J441 Chronic obstructive pulmonary disease with (acute) exacerbation: Secondary | ICD-10-CM | POA: Diagnosis present

## 2020-10-29 LAB — SARS CORONAVIRUS 2 (TAT 6-24 HRS): SARS Coronavirus 2: NEGATIVE

## 2020-10-29 MED ORDER — PREDNISONE 20 MG PO TABS
40.0000 mg | ORAL_TABLET | Freq: Every day | ORAL | 0 refills | Status: DC
Start: 2020-10-29 — End: 2020-11-25

## 2020-10-29 MED ORDER — KETOROLAC TROMETHAMINE 30 MG/ML IJ SOLN
INTRAMUSCULAR | Status: AC
  Filled 2020-10-29: qty 1

## 2020-10-29 MED ORDER — ONDANSETRON 4 MG PO TBDP
4.0000 mg | ORAL_TABLET | Freq: Three times a day (TID) | ORAL | 0 refills | Status: DC | PRN
Start: 2020-10-29 — End: 2021-09-01

## 2020-10-29 MED ORDER — KETOROLAC TROMETHAMINE 30 MG/ML IJ SOLN
30.0000 mg | Freq: Once | INTRAMUSCULAR | Status: AC
  Administered 2020-10-29: 30 mg via INTRAMUSCULAR

## 2020-10-29 MED ORDER — AZITHROMYCIN 250 MG PO TABS
250.0000 mg | ORAL_TABLET | Freq: Every day | ORAL | 0 refills | Status: DC
Start: 2020-10-29 — End: 2020-12-09

## 2020-10-29 NOTE — ED Triage Notes (Signed)
Pt in with c/o productive cough and headache that has been going on since Thursday. Also c/o fever Tmax 103 yesterday and chest pain when she coughs  Pt has taken tylenol with minimal relief  Denies diarrhea, sob

## 2020-10-29 NOTE — Discharge Instructions (Signed)
You have been tested for COVID-19 today. °If your test returns positive, you will receive a phone call from [facility] regarding your results. °Negative test results are not called. °Both positive and negative results area always visible on MyChart. °If you do not have a MyChart account, sign up instructions are provided in your discharge papers. °Please do not hesitate to contact us should you have questions or concerns. ° °
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
## 2020-11-03 ENCOUNTER — Ambulatory Visit: Payer: Medicaid Other | Admitting: Pulmonary Disease

## 2020-11-21 ENCOUNTER — Ambulatory Visit: Payer: Medicaid Other | Admitting: Pulmonary Disease

## 2020-11-22 ENCOUNTER — Ambulatory Visit: Payer: Medicaid Other | Admitting: Pulmonary Disease

## 2020-11-24 ENCOUNTER — Ambulatory Visit: Payer: Medicaid Other | Admitting: Pulmonary Disease

## 2020-11-25 ENCOUNTER — Telehealth: Payer: Self-pay | Admitting: Pulmonary Disease

## 2020-11-25 MED ORDER — PREDNISONE 20 MG PO TABS: 40.0000 mg | ORAL_TABLET | Freq: Every day | ORAL | 0 refills | Status: DC

## 2020-11-25 MED ORDER — STIOLTO RESPIMAT 2.5-2.5 MCG/ACT IN AERS: 2.0000 | INHALATION_SPRAY | Freq: Every day | RESPIRATORY_TRACT | 6 refills | Status: DC

## 2020-11-25 NOTE — Telephone Encounter (Signed)
Please send in 5 days of prednisone 40mg  daily. No need for another antibiotic course.  Thanks, 

## 2020-11-25 NOTE — Addendum Note (Signed)
Addended by: Velvet Bathe on: 11/25/2020 04:18 PM   Modules accepted: Orders

## 2020-11-25 NOTE — Telephone Encounter (Signed)
Spoke with pt, has several requests- Requesting a refill of Stiolto.  This has been sent to preferred pharmacy. Pt also requesting a qualifying walk for a POC at her upcoming visit.  I have made a note in appt notes to obtain a qualifying walk for patient.    Lastly- pt was seen in urgent care on 10/29/20 for copd exacerbation.  Pt states that she was given a zpak and a pred taper.  Pt states she is feeling some better since having these meds, but is not back to baseline.  Requesting another round of pred taper and zpak.   S/s: increased sob with minimal exertion, fatigue, prod cough with clear mucus.  S/s present X1 month.   Denies: fever, nausea, vomiting, diarhhea, night sweats, headache, loss of taste/smell.  Pt has been taking maintenance inhalers, using albuterol an average of BID since being seen in UC.   Denies any sick contacts.  Pt was tested 10/29/20 for covid and test was negative (in Epic).  Pt has received covid vaccine but no booster- updated chart to reflect this.    Sending to DOD since MW is not available today.  Dr. Francine Graven please advise, thanks!

## 2020-11-25 NOTE — Telephone Encounter (Signed)
Spoke with pt, aware of recs.  rx sent to preferred pharmacy.  Nothing further needed at this time- will close encounter.   

## 2020-12-05 ENCOUNTER — Other Ambulatory Visit: Payer: Self-pay | Admitting: Internal Medicine

## 2020-12-05 ENCOUNTER — Ambulatory Visit: Payer: Medicaid Other | Admitting: Pulmonary Disease

## 2020-12-05 DIAGNOSIS — J441 Chronic obstructive pulmonary disease with (acute) exacerbation: Secondary | ICD-10-CM

## 2020-12-09 ENCOUNTER — Ambulatory Visit (INDEPENDENT_AMBULATORY_CARE_PROVIDER_SITE_OTHER): Payer: Medicaid Other

## 2020-12-09 ENCOUNTER — Encounter: Payer: Self-pay | Admitting: Adult Health

## 2020-12-09 ENCOUNTER — Other Ambulatory Visit: Payer: Self-pay

## 2020-12-09 ENCOUNTER — Ambulatory Visit (INDEPENDENT_AMBULATORY_CARE_PROVIDER_SITE_OTHER): Payer: Medicaid Other | Admitting: Adult Health

## 2020-12-09 DIAGNOSIS — F17210 Nicotine dependence, cigarettes, uncomplicated: Secondary | ICD-10-CM | POA: Diagnosis not present

## 2020-12-09 DIAGNOSIS — J449 Chronic obstructive pulmonary disease, unspecified: Secondary | ICD-10-CM | POA: Diagnosis not present

## 2020-12-09 MED ORDER — TRELEGY ELLIPTA 100-62.5-25 MCG/INH IN AEPB
1.0000 | INHALATION_SPRAY | Freq: Every day | RESPIRATORY_TRACT | 4 refills | Status: DC
Start: 2020-12-09 — End: 2021-01-26

## 2020-12-09 NOTE — Addendum Note (Signed)
Addended by: Delrae Rend on: 12/09/2020 03:12 PM   Modules accepted: Orders

## 2020-12-09 NOTE — Addendum Note (Signed)
Addended by: Delrae Rend on: 12/09/2020 03:21 PM   Modules accepted: Orders

## 2020-12-09 NOTE — Assessment & Plan Note (Signed)
>>  ASSESSMENT AND PLAN FOR COPD WITH ACUTE EXACERBATION (HCC) WRITTEN ON 12/09/2020  3:04 PM BY PARRETT, TAMMY S, NP  Recent flare improving  Will change to triple therapy   Plan  Patient Instructions  Change Stiolto to TRELEGY 1 puff daily , rinse after use.  Mucinex DM Twice daily  As needed  Cough/congestion  Cut back on the smoking  Chest xray today .  Albuterol inhaler As needed  Wheezing  Activity as tolerated.  Follow up with Dr. Sherene Sires  In 4 months and As needed

## 2020-12-09 NOTE — Assessment & Plan Note (Signed)
Smoking cessation  

## 2020-12-09 NOTE — Assessment & Plan Note (Signed)
Recent flare improving  Will change to triple therapy   Plan  Patient Instructions  Change Stiolto to TRELEGY 1 puff daily , rinse after use.  Mucinex DM Twice daily  As needed  Cough/congestion  Cut back on the smoking  Chest xray today .  Albuterol inhaler As needed  Wheezing  Activity as tolerated.  Follow up with Dr. Sherene Sires  In 4 months and As needed
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
## 2020-12-09 NOTE — Patient Instructions (Signed)
Change Stiolto to TRELEGY 1 puff daily , rinse after use.  Mucinex DM Twice daily  As needed  Cough/congestion  Cut back on the smoking  Chest xray today .  Albuterol inhaler As needed  Wheezing  Activity as tolerated.  Follow up with Dr. Sherene Sires  In 4 months and As needed

## 2020-12-20 ENCOUNTER — Other Ambulatory Visit: Payer: Self-pay | Admitting: Internal Medicine

## 2020-12-20 DIAGNOSIS — J441 Chronic obstructive pulmonary disease with (acute) exacerbation: Secondary | ICD-10-CM

## 2020-12-20 NOTE — Telephone Encounter (Addendum)
Request came in as refill on [employer]. Refilled with #1/2 refills. Called pts pharmacy Spoke with Joni Reining who states [employer] is covered by pts ins at least it was last month.

## 2020-12-22 ENCOUNTER — Telehealth: Payer: Self-pay | Admitting: Adult Health

## 2020-12-22 NOTE — Telephone Encounter (Signed)
May go back to Stiolto 2 puffs daily continue to brush rinse and gargle after use  For thrush begin Mycelex troche  5 times daily for 1 week. #35 with no refills.   If Stiolto is not covered under her insurance see if she would like [place] to start patient assistance

## 2020-12-22 NOTE — Telephone Encounter (Signed)
ATC patient unable to reach LM to call back office (x1)  Try again tomorrow 12/23/20 then send letter to contact office & close out message if unable to reach as triage protocol

## 2020-12-22 NOTE — Telephone Encounter (Signed)
Called and spoke to pt. Pt c/o white patches on tongue and cheeks, noticed this morning. Pt states she was on the trelegy but used the last of her samples and states she didn't notice much of a difference in her breathing. Pt also states her insurance didn't cover it. Pt states she used her Stiolto too quickly and is now out and is needing samples. Advised pt the importance of taking medication as they are prescribed and the danger of taking too much of any medication. Pt verbalized understanding.    Tammy, please advise if can rx something for thrush and if ok to give Stiolto samples.

## 2020-12-22 NOTE — Telephone Encounter (Signed)
LMTCB

## 2020-12-23 MED ORDER — STIOLTO RESPIMAT 2.5-2.5 MCG/ACT IN AERS: 2.0000 | INHALATION_SPRAY | Freq: Every day | RESPIRATORY_TRACT | 0 refills | Status: DC

## 2020-12-23 NOTE — Telephone Encounter (Signed)
Patient called back stating her insurance will cover the stiolto but not until the 25th. Samples placed upfront.   Nothing further needed at this time.

## 2020-12-23 NOTE — Addendum Note (Signed)
Addended by: Kerin Ransom on: 12/23/2020 12:32 PM   Modules accepted: Orders

## 2020-12-23 NOTE — Telephone Encounter (Signed)
Lm x2 for patient.  Letter has been mailed to address on file per office protocol.  Will close encounter.

## 2021-01-23 ENCOUNTER — Telehealth: Payer: Self-pay | Admitting: Internal Medicine

## 2021-01-23 ENCOUNTER — Other Ambulatory Visit: Payer: Self-pay | Admitting: Internal Medicine

## 2021-01-23 DIAGNOSIS — J441 Chronic obstructive pulmonary disease with (acute) exacerbation: Secondary | ICD-10-CM

## 2021-01-23 MED ORDER — CLOTRIMAZOLE 10 MG MT TROC: 10.0000 mg | Freq: Every day | OROMUCOSAL | 5 refills | Status: DC

## 2021-01-23 NOTE — Telephone Encounter (Signed)
Mycelex(clotrimazole)  troche 10 mg 5 x daily prn   # 20  Refill x 5

## 2021-01-23 NOTE — Telephone Encounter (Signed)
Called and spoke with pt and she is aware of meds that have been sent to her pharmacy.  Nothing further is needed.

## 2021-01-23 NOTE — Telephone Encounter (Signed)
Pt calling about the myclex troche.  She stated that this was to be sent in with her inhaler on 02/11 and was not.  She stated that she is having some thrush issues and would like to know if this can be sent to the pharmacy.  MW please advise. Thanks

## 2021-01-25 ENCOUNTER — Ambulatory Visit: Payer: Medicaid Other

## 2021-01-26 ENCOUNTER — Telehealth: Payer: Self-pay | Admitting: Internal Medicine

## 2021-01-26 MED ORDER — STIOLTO RESPIMAT 2.5-2.5 MCG/ACT IN AERS: 2.0000 | INHALATION_SPRAY | Freq: Every day | RESPIRATORY_TRACT | 0 refills | Status: DC
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
## 2021-04-11 ENCOUNTER — Telehealth: Payer: Self-pay | Admitting: Pulmonary Disease

## 2021-04-11 NOTE — Telephone Encounter (Signed)
ATC, left VM for callback. 

## 2021-04-12 MED ORDER — STIOLTO RESPIMAT 2.5-2.5 MCG/ACT IN AERS: 2.0000 | INHALATION_SPRAY | Freq: Every day | RESPIRATORY_TRACT | 0 refills | Status: DC

## 2021-04-12 NOTE — Telephone Encounter (Signed)
Patient came in office requesting Stiolto samples.  Stiolto samples given to Patient. Nothing further at this time.

## 2021-04-12 NOTE — Telephone Encounter (Signed)
Patient is returning phone call. Patient phone number is [phone].

## 2021-05-01 ENCOUNTER — Other Ambulatory Visit: Payer: Self-pay | Admitting: Internal Medicine

## 2021-05-01 DIAGNOSIS — J441 Chronic obstructive pulmonary disease with (acute) exacerbation: Secondary | ICD-10-CM

## 2021-06-12 ENCOUNTER — Telehealth: Payer: Self-pay | Admitting: Internal Medicine

## 2021-06-12 MED ORDER — STIOLTO RESPIMAT 2.5-2.5 MCG/ACT IN AERS: 2.0000 | INHALATION_SPRAY | Freq: Every day | RESPIRATORY_TRACT | 0 refills | Status: DC

## 2021-06-12 NOTE — Telephone Encounter (Signed)
Called and spoke with patient. She was requesting samples of Stiolto. She stated that she is still trying to get her insurance straightened out so she can get Stiolto on her own. I advised her that I will leave 2 samples up front for her. She verbalized understanding.   Nothing further needed at time of call.

## 2021-06-21 ENCOUNTER — Other Ambulatory Visit: Payer: Self-pay | Admitting: Family Medicine

## 2021-06-21 DIAGNOSIS — E039 Hypothyroidism, unspecified: Secondary | ICD-10-CM

## 2021-07-15 ENCOUNTER — Other Ambulatory Visit: Payer: Self-pay | Admitting: Internal Medicine

## 2021-07-15 DIAGNOSIS — J441 Chronic obstructive pulmonary disease with (acute) exacerbation: Secondary | ICD-10-CM

## 2021-07-25 ENCOUNTER — Telehealth: Payer: Self-pay | Admitting: Internal Medicine

## 2021-07-25 MED ORDER — STIOLTO RESPIMAT 2.5-2.5 MCG/ACT IN AERS: 2.0000 | INHALATION_SPRAY | Freq: Every day | RESPIRATORY_TRACT | 0 refills | Status: DC

## 2021-07-25 NOTE — Telephone Encounter (Signed)
I have called the pt and she is aware of samples that have been left up front and she will pick these up tomorrow.  Nothing further is needed.

## 2021-08-10 ENCOUNTER — Other Ambulatory Visit: Payer: Self-pay | Admitting: Internal Medicine

## 2021-08-10 DIAGNOSIS — J441 Chronic obstructive pulmonary disease with (acute) exacerbation: Secondary | ICD-10-CM

## 2021-08-18 ENCOUNTER — Ambulatory Visit: Payer: Medicaid Other | Admitting: Internal Medicine
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
## 2021-08-25 ENCOUNTER — Telehealth: Payer: Self-pay | Admitting: Internal Medicine

## 2021-08-25 MED ORDER — STIOLTO RESPIMAT 2.5-2.5 MCG/ACT IN AERS: 2.0000 | INHALATION_SPRAY | Freq: Every day | RESPIRATORY_TRACT | 0 refills | Status: DC

## 2021-08-25 NOTE — Telephone Encounter (Signed)
Call made to patient, confirmed DOB. Made aware of MW recommendations. Aware sample will be placed upfront. Aware she needs to bring all her medications to her appt. Also aware she needs to contact her insurance and find out what is covered and affordable before her appt Monday. Voiced understanding. Sample placed up front.   Nothing further needed at this time.

## 2021-08-25 NOTE — Telephone Encounter (Signed)
Ok for one more sample of Stiolto but I suspect she's not actually getting any form her insurance company and will need to make ov with all meds in hand before additional samples and at that visit provide [place] with a list of what her insurance will cover

## 2021-08-25 NOTE — Telephone Encounter (Signed)
I have called the pt and she stated that the insurance is giving her a hard time about getting the stiolto.  I asked if it needed a PA and she said no that they will not let her get this 2 days early.    Pt was given 2 samples in August and September but she stated that she also got her rx each month as well.  I verified with her that she was only taking 2 puffs daily and she said yes.    Pt was last seen 11/2020 by TP but has no showed for her last 4-5 appts.    MW please advise. Thanks

## 2021-08-28 ENCOUNTER — Other Ambulatory Visit: Payer: Self-pay | Admitting: Internal Medicine

## 2021-08-28 ENCOUNTER — Ambulatory Visit: Payer: Medicaid Other | Admitting: Internal Medicine

## 2021-08-28 DIAGNOSIS — J441 Chronic obstructive pulmonary disease with (acute) exacerbation: Secondary | ICD-10-CM
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
## 2021-08-29 ENCOUNTER — Other Ambulatory Visit: Payer: Self-pay | Admitting: Internal Medicine

## 2021-08-29 DIAGNOSIS — J441 Chronic obstructive pulmonary disease with (acute) exacerbation: Secondary | ICD-10-CM

## 2021-08-31 ENCOUNTER — Ambulatory Visit: Payer: Medicaid Other | Admitting: Internal Medicine
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
## 2021-09-01 ENCOUNTER — Ambulatory Visit (INDEPENDENT_AMBULATORY_CARE_PROVIDER_SITE_OTHER): Payer: Medicaid Other | Admitting: Internal Medicine

## 2021-09-01 ENCOUNTER — Other Ambulatory Visit: Payer: Self-pay

## 2021-09-01 ENCOUNTER — Encounter: Payer: Self-pay | Admitting: Internal Medicine

## 2021-09-01 DIAGNOSIS — J449 Chronic obstructive pulmonary disease, unspecified: Secondary | ICD-10-CM

## 2021-09-01 DIAGNOSIS — F17210 Nicotine dependence, cigarettes, uncomplicated: Secondary | ICD-10-CM

## 2021-09-01 DIAGNOSIS — J441 Chronic obstructive pulmonary disease with (acute) exacerbation: Secondary | ICD-10-CM

## 2021-09-01 MED ORDER — PROAIR HFA 108 (90 BASE) MCG/ACT IN AERS: INHALATION_SPRAY | RESPIRATORY_TRACT | 11 refills | Status: DC

## 2021-09-01 MED ORDER — STIOLTO RESPIMAT 2.5-2.5 MCG/ACT IN AERS: 2.0000 | INHALATION_SPRAY | Freq: Every day | RESPIRATORY_TRACT | 11 refills | Status: DC

## 2021-09-01 NOTE — Patient Instructions (Signed)
No change in medications   - Only use your albuterol as a rescue medication to be used if you can't catch your breath by resting or doing a relaxed purse lip breathing pattern.  - The less you use it, the better it will work when you need it. - Ok to use up to 2 puffs  every 4 hours if you must but call for immediate appointment if use goes up over your usual need - Don't leave home without it !!  (think of it like the spare tire for your car)    Ok to try albuterol 15 min before an activity (on alternating days)  that you know would usually make you short of breath and see if it makes any difference and if makes none then don't take albuterol after activity unless you can't catch your breath as this means it's the resting that helps, not the albuterol.      Please schedule a follow up visit in 12  months but call sooner if needed
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
## 2021-09-02 ENCOUNTER — Encounter: Payer: Self-pay | Admitting: Internal Medicine

## 2021-09-02 NOTE — Addendum Note (Signed)
Addended by: Sandrea Hughs B on: 09/02/2021 02:28 PM   Modules accepted: Level of Service

## 2021-09-02 NOTE — Assessment & Plan Note (Signed)
>>  ASSESSMENT AND PLAN FOR COPD WITH ACUTE EXACERBATION (HCC) WRITTEN ON 09/02/2021  1:57 PM BY Sandrea Hughs B, MD  Active smoker Spirometry 09/18/2018  FEV1 1.1 (44%)  Ratio 49 with classic curvature p am advair/spiriva dpi's - 09/18/2018  After extensive coaching inhaler device,  effectiveness =    75% with smi so try change to stiolto with pred x 6 d as "plan D"  > not doing as of 10/19/2019   - The proper method of use, as well as anticipated side effects, of a metered-dose inhaler were discussed and demonstrated to the patient using teach back method. Improved effectiveness after extensive coaching during this visit to a level of approximately 75 % from a baseline of 50 % > continue stiolto  Pt is Group B in terms of symptom/risk and laba/lama therefore appropriate rx at this point >>>  stioto still appropriate plus approp saba  Re SABA :  I spent extra time with pt today reviewing appropriate use of albuterol for prn use on exertion with the following points: 1) saba is for relief of sob that does not improve by walking a slower pace or resting but rather if the pt does not improve after trying this first. 2) If the pt is convinced, as many are, that saba helps recover from activity faster then it's easy to tell if this is the case by re-challenging : ie stop, take the inhaler, then p 5 minutes try the exact same activity (intensity of workload) that just caused the symptoms and see if they are substantially diminished or not after saba 3) if there is an activity that reproducibly causes the symptoms, try the saba 15 min before the activity on alternate days   If in fact the saba really does help, then fine to continue to use it prn but advised may need to look closer at the maintenance regimen being used to achieve better control of airways disease with exertion.

## 2021-09-02 NOTE — Assessment & Plan Note (Signed)
Counseled re importance of smoking cessation but did not meet time criteria for separate billing           Each maintenance medication was reviewed in detail including emphasizing most importantly the difference between maintenance and prns and under what circumstances the prns are to be triggered using an action plan format where appropriate.  Total time for H and P, chart review, counseling, reviewing smi device(s) and generating customized AVS unique to this office visit / same day charting > 30 min       

## 2021-09-02 NOTE — Assessment & Plan Note (Signed)
Active smoker Spirometry 09/18/2018  FEV1 1.1 (44%)  Ratio 49 with classic curvature p am advair/spiriva dpi's - 09/18/2018  After extensive coaching inhaler device,  effectiveness =    75% with smi so try change to stiolto with pred x 6 d as "plan D"  > not doing as of 10/19/2019   - The proper method of use, as well as anticipated side effects, of a metered-dose inhaler were discussed and demonstrated to the patient using teach back method. Improved effectiveness after extensive coaching during this visit to a level of approximately 75 % from a baseline of 50 % > continue stiolto  Pt is Group B in terms of symptom/risk and laba/lama therefore appropriate rx at this point >>>  stioto still appropriate plus approp saba  Re SABA :  I spent extra time with pt today reviewing appropriate use of albuterol for prn use on exertion with the following points: 1) saba is for relief of sob that does not improve by walking a slower pace or resting but rather if the pt does not improve after trying this first. 2) If the pt is convinced, as many are, that saba helps recover from activity faster then it's easy to tell if this is the case by re-challenging : ie stop, take the inhaler, then p 5 minutes try the exact same activity (intensity of workload) that just caused the symptoms and see if they are substantially diminished or not after saba 3) if there is an activity that reproducibly causes the symptoms, try the saba 15 min before the activity on alternate days   If in fact the saba really does help, then fine to continue to use it prn but advised may need to look closer at the maintenance regimen being used to achieve better control of airways disease with exertion.

## 2021-10-17 ENCOUNTER — Ambulatory Visit (HOSPITAL_COMMUNITY): Payer: Self-pay

## 2021-10-24 ENCOUNTER — Other Ambulatory Visit: Payer: Self-pay | Admitting: Internal Medicine

## 2021-10-26 ENCOUNTER — Other Ambulatory Visit: Payer: Self-pay | Admitting: Family Medicine

## 2021-10-26 DIAGNOSIS — J019 Acute sinusitis, unspecified: Secondary | ICD-10-CM

## 2021-12-04 ENCOUNTER — Ambulatory Visit: Payer: Medicaid Other | Admitting: Podiatry

## 2021-12-13 ENCOUNTER — Ambulatory Visit: Payer: Medicaid Other | Admitting: Podiatry

## 2021-12-27 ENCOUNTER — Ambulatory Visit: Payer: Medicaid Other | Admitting: Podiatry

## 2022-01-09 ENCOUNTER — Other Ambulatory Visit: Payer: Self-pay

## 2022-01-09 DIAGNOSIS — J441 Chronic obstructive pulmonary disease with (acute) exacerbation: Secondary | ICD-10-CM

## 2022-01-15 MED ORDER — ALBUTEROL SULFATE 2.5 MG/0.5ML IN NEBU: INHALATION_SOLUTION | RESPIRATORY_TRACT | 1 refills | Status: DC

## 2022-03-23 ENCOUNTER — Telehealth: Payer: Self-pay | Admitting: Internal Medicine

## 2022-03-23 ENCOUNTER — Other Ambulatory Visit: Payer: Self-pay

## 2022-03-23 MED ORDER — STIOLTO RESPIMAT 2.5-2.5 MCG/ACT IN AERS: 2.0000 | INHALATION_SPRAY | Freq: Every day | RESPIRATORY_TRACT | 0 refills | Status: DC

## 2022-03-23 NOTE — Telephone Encounter (Signed)
Called and spoke with patient. Patient wants to know if she could come by and get a sample of stiolto. Advised patient I would leave a sample of stiolto at the front desk with her name on it.  ? ?Nothing further needed.  ?

## 2022-04-17 ENCOUNTER — Encounter: Payer: Self-pay | Admitting: *Deleted

## 2022-04-17 ENCOUNTER — Ambulatory Visit: Payer: Medicaid Other | Admitting: Internal Medicine
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
## 2022-04-20 ENCOUNTER — Ambulatory Visit: Payer: Medicaid Other | Admitting: Internal Medicine
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
## 2022-04-21 ENCOUNTER — Other Ambulatory Visit: Payer: Self-pay | Admitting: Internal Medicine

## 2022-04-21 DIAGNOSIS — J441 Chronic obstructive pulmonary disease with (acute) exacerbation: Secondary | ICD-10-CM

## 2022-04-23 ENCOUNTER — Telehealth: Payer: Self-pay | Admitting: Internal Medicine

## 2022-04-23 MED ORDER — STIOLTO RESPIMAT 2.5-2.5 MCG/ACT IN AERS: 2.0000 | INHALATION_SPRAY | Freq: Every day | RESPIRATORY_TRACT | 0 refills | Status: DC
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
## 2022-05-01 ENCOUNTER — Ambulatory Visit: Payer: Medicaid Other | Admitting: Adult Health

## 2022-05-04 ENCOUNTER — Ambulatory Visit: Payer: Medicaid Other | Admitting: Nurse Practitioner

## 2022-05-08 ENCOUNTER — Ambulatory Visit: Payer: Medicaid Other | Admitting: Nurse Practitioner

## 2022-05-22 ENCOUNTER — Other Ambulatory Visit: Payer: Self-pay

## 2022-05-22 DIAGNOSIS — E039 Hypothyroidism, unspecified: Secondary | ICD-10-CM

## 2022-05-22 MED ORDER — LEVOTHYROXINE SODIUM 125 MCG PO TABS: 125.0000 ug | ORAL_TABLET | Freq: Every day | ORAL | 3 refills | Status: DC

## 2022-05-25 ENCOUNTER — Ambulatory Visit: Payer: Medicaid Other | Admitting: Nurse Practitioner

## 2022-05-25 ENCOUNTER — Ambulatory Visit (INDEPENDENT_AMBULATORY_CARE_PROVIDER_SITE_OTHER): Payer: Medicaid Other

## 2022-05-25 ENCOUNTER — Encounter: Payer: Self-pay | Admitting: Nurse Practitioner

## 2022-05-25 VITALS — BP 116/74 | HR 65 | Temp 98.0°F | Ht 60.0 in | Wt 138.0 lb

## 2022-05-25 DIAGNOSIS — J441 Chronic obstructive pulmonary disease with (acute) exacerbation: Secondary | ICD-10-CM

## 2022-05-25 DIAGNOSIS — R0602 Shortness of breath: Secondary | ICD-10-CM | POA: Insufficient documentation

## 2022-05-25 DIAGNOSIS — R0609 Other forms of dyspnea: Secondary | ICD-10-CM | POA: Diagnosis not present

## 2022-05-25 DIAGNOSIS — F17210 Nicotine dependence, cigarettes, uncomplicated: Secondary | ICD-10-CM | POA: Diagnosis not present

## 2022-05-25 MED ORDER — DOXYCYCLINE HYCLATE 100 MG PO TABS: 100.0000 mg | ORAL_TABLET | Freq: Two times a day (BID) | ORAL | 0 refills | Status: AC

## 2022-05-25 MED ORDER — BREZTRI AEROSPHERE 160-9-4.8 MCG/ACT IN AERO: 2.0000 | INHALATION_SPRAY | Freq: Two times a day (BID) | RESPIRATORY_TRACT | 0 refills | Status: DC

## 2022-05-25 MED ORDER — NICOTINE 14 MG/24HR TD PT24: 14.0000 mg | MEDICATED_PATCH | Freq: Every day | TRANSDERMAL | 0 refills | Status: AC

## 2022-05-25 MED ORDER — PREDNISONE 20 MG PO TABS: 40.0000 mg | ORAL_TABLET | Freq: Every day | ORAL | 0 refills | Status: AC

## 2022-05-25 MED ORDER — METHYLPREDNISOLONE ACETATE 80 MG/ML IJ SUSP
80.0000 mg | Freq: Once | INTRAMUSCULAR | Status: AC
  Administered 2022-05-25: 80 mg via INTRAMUSCULAR

## 2022-05-25 NOTE — Assessment & Plan Note (Signed)
Active smoker. She is wanting to quit. We are obtaining CT of the chest and referring her to the lung cancer screening program.   The patient's current tobacco use: 5 cigarettes/day  The patient was advised to quit and impact of smoking on their health.  I assessed the patient's willingness to attempt to quit. I provided methods and skills for cessation. We reviewed medication management of smoking session drugs if appropriate. We will prescribe nicotine patches.  Resources to help quit smoking were provided. A smoking cessation quit date was set: June 25, 2022 Follow-up was arranged in our clinic.  The amount of time spent counseling patient was 4 mins
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
## 2022-05-25 NOTE — Patient Instructions (Addendum)
Stop Stiolto. Trial Breztri 2 puffs Twice daily. Brush tongue and rinse mouth afterwards  Continue Albuterol inhaler 2 puffs or 3 mL neb every 6 hours as needed for shortness of breath or wheezing. Notify if symptoms persist despite rescue inhaler/neb use.  Use neb at least twice daily until symptoms improve. Continue flonase 2 sprays each nostril daily  Prednisone 40 mg for 5 days.  Take in a.m. with food.  Start tomorrow. Doxycycline 1 tab twice daily for 7 days.  Take with food.  Avoid direct sun exposure and wear sunscreen when outside while taking. Mucinex 600 mg twice daily for chest congestion Nicotine patch.  Apply one 14 mg patch daily for 6 weeks.  Then decrease to 7 mg patch daily for 2 weeks.  Goal quit date 8/14  Chest x-ray today-we will notify you of any abnormal results  Referral to lung cancer screening program  Follow-up in 2 weeks with Dr. Sherene Sires or Rhunette Croft NP. If symptoms do not improve or worsen, please contact office for sooner follow up or seek emergency care.

## 2022-05-25 NOTE — Assessment & Plan Note (Signed)
>>  ASSESSMENT AND PLAN FOR COPD WITH ACUTE EXACERBATION (HCC) WRITTEN ON 05/25/2022  5:37 PM BY COBB, KATHERINE V, NP  AECOPD with dyspnea and productive cough. Non-toxic with stable VS on room air in office. Depo 80 mg inj x 1 in office. Rx prednisone burst and empiric doxycyline. Step up to triple therapy with Markus Daft - provided with samples today. Continue PRN albuterol. Advised her to use neb at least Twice daily until symptoms improve. Order for new neb machine sent. We also sent order for pulse ox to DME; hopefully they can provide her with one as she is unable to afford one. Walking oximetry today without desaturations on room air. CXR without acute process.   Patient Instructions  Stop Stiolto. Trial Breztri 2 puffs Twice daily. Brush tongue and rinse mouth afterwards  Continue Albuterol inhaler 2 puffs or 3 mL neb every 6 hours as needed for shortness of breath or wheezing. Notify if symptoms persist despite rescue inhaler/neb use.  Use neb at least twice daily until symptoms improve. Continue flonase 2 sprays each nostril daily  Prednisone 40 mg for 5 days.  Take in a.m. with food.  Start tomorrow. Doxycycline 1 tab twice daily for 7 days.  Take with food.  Avoid direct sun exposure and wear sunscreen when outside while taking. Mucinex 600 mg twice daily for chest congestion Nicotine patch.  Apply one 14 mg patch daily for 6 weeks.  Then decrease to 7 mg patch daily for 2 weeks.  Goal quit date 8/14  Chest x-ray today-we will notify you of any abnormal results  Referral to lung cancer screening program  Follow-up in 2 weeks with Dr. Sherene Sires or Rhunette Croft NP. If symptoms do not improve or worsen, please contact office for sooner follow up or seek emergency care.

## 2022-05-25 NOTE — Assessment & Plan Note (Signed)
Appears to be related to poorly controlled disease and now with acute exacerbation. However, she has had around a 10 pound weight loss and anorexia. Given this and her significant smoking hx, we will check CT chest to rule out underlying malignancy. See above plan.

## 2022-05-25 NOTE — Assessment & Plan Note (Signed)
AECOPD with dyspnea and productive cough. Non-toxic with stable VS on room air in office. Depo 80 mg inj x 1 in office. Rx prednisone burst and empiric doxycyline. Step up to triple therapy with Markus Daft - provided with samples today. Continue PRN albuterol. Advised her to use neb at least Twice daily until symptoms improve. Order for new neb machine sent. We also sent order for pulse ox to DME; hopefully they can provide her with one as she is unable to afford one. Walking oximetry today without desaturations on room air. CXR without acute process.   Patient Instructions  Stop Stiolto. Trial Breztri 2 puffs Twice daily. Brush tongue and rinse mouth afterwards  Continue Albuterol inhaler 2 puffs or 3 mL neb every 6 hours as needed for shortness of breath or wheezing. Notify if symptoms persist despite rescue inhaler/neb use.  Use neb at least twice daily until symptoms improve. Continue flonase 2 sprays each nostril daily  Prednisone 40 mg for 5 days.  Take in a.m. with food.  Start tomorrow. Doxycycline 1 tab twice daily for 7 days.  Take with food.  Avoid direct sun exposure and wear sunscreen when outside while taking. Mucinex 600 mg twice daily for chest congestion Nicotine patch.  Apply one 14 mg patch daily for 6 weeks.  Then decrease to 7 mg patch daily for 2 weeks.  Goal quit date 8/14  Chest x-ray today-we will notify you of any abnormal results  Referral to lung cancer screening program  Follow-up in 2 weeks with Dr. Sherene Sires or Rhunette Croft NP. If symptoms do not improve or worsen, please contact office for sooner follow up or seek emergency care.

## 2022-05-28 ENCOUNTER — Telehealth: Payer: Self-pay | Admitting: Nurse Practitioner

## 2022-05-28 NOTE — Telephone Encounter (Signed)
I believe neb order was sent at her OV. Please verify this if you don't mind! Yes, okay to refill her albuterol neb 3 mL q 6 hr PRN SOB or wheezing. Thanks.

## 2022-05-28 NOTE — Telephone Encounter (Signed)
Called and spoke with patient. She stated that she has not actually heard from [facility] that they do not accept her insurance. I advised her that I would call [facility] in the morning to confirm, she verbalized understanding.

## 2022-05-28 NOTE — Telephone Encounter (Signed)
Albuterol solution was originally ordered by another provider.   [name], are you ok with [place] ordering the solution under your name as well as a new nebulizer machine? Thanks.

## 2022-05-28 NOTE — Telephone Encounter (Signed)
Patient called back returning nurse call- nebulizer machine she has is not working well. [facility] used to send supplies, but they stopped taking her insurance- patient would like to know if it can be called into a DME that she can pick up. Informed that the order was placed.  Call back number is [phone].

## 2022-05-28 NOTE — Telephone Encounter (Signed)
Also will need solution for nebulizer sent [facility] on [address]

## 2022-05-28 NOTE — Telephone Encounter (Signed)
Called and left detailed message about medications and nebulizer machine.

## 2022-05-31 NOTE — Telephone Encounter (Signed)
Called [facility] but was on hold for over 10 minutes. Will call back later.

## 2022-06-01 ENCOUNTER — Telehealth: Payer: Self-pay | Admitting: Internal Medicine

## 2022-06-01 MED ORDER — CLOTRIMAZOLE 10 MG MT TROC: 10.0000 mg | Freq: Every day | OROMUCOSAL | 5 refills | Status: DC

## 2022-06-01 NOTE — Telephone Encounter (Addendum)
I called the patient and let her know that a refill of the  Clotrimazole troche was sent per Dr. Sherene Sires. She voices understanding. Nothing further needed.

## 2022-06-01 NOTE — Telephone Encounter (Signed)
Clotrimazole troche 10 mg qid prn   #16    2 refills

## 2022-06-01 NOTE — Telephone Encounter (Signed)
Spoke with pt who states she is developing thrush and wants to know if something could be called in to help her out. Dr. Sherene Sires, please advise.

## 2022-06-06 ENCOUNTER — Ambulatory Visit: Payer: Medicaid Other | Admitting: Podiatry

## 2022-06-07 ENCOUNTER — Ambulatory Visit: Payer: Medicaid Other | Admitting: Podiatry

## 2022-06-11 ENCOUNTER — Telehealth: Payer: Self-pay | Admitting: Nurse Practitioner

## 2022-06-11 DIAGNOSIS — J441 Chronic obstructive pulmonary disease with (acute) exacerbation: Secondary | ICD-10-CM

## 2022-06-11 MED ORDER — ALBUTEROL SULFATE 2.5 MG/0.5ML IN NEBU: INHALATION_SOLUTION | RESPIRATORY_TRACT | 3 refills | Status: DC

## 2022-06-11 MED ORDER — BREZTRI AEROSPHERE 160-9-4.8 MCG/ACT IN AERO: 2.0000 | INHALATION_SPRAY | Freq: Two times a day (BID) | RESPIRATORY_TRACT | 4 refills | Status: DC

## 2022-06-11 NOTE — Telephone Encounter (Signed)
Called patient and verified medications that she needed refilled. Verified pharmacy as well. Nothing further needed

## 2022-06-11 NOTE — Telephone Encounter (Signed)
Pt is requesting a refill of Breztri but wants to know if she can get 2 at a time instead of 1, Nebulizer came to her house and she wants to know if an order for checking her O2 levels can be sent to [facility] she thinks thatsss the name, please advise, also needing albuterol for machine, please advise

## 2022-06-14 ENCOUNTER — Ambulatory Visit (HOSPITAL_COMMUNITY)
Admission: RE | Admit: 2022-06-14 | Discharge: 2022-06-14 | Disposition: A | Discharge status: Home / Self Care | Payer: Medicaid Other | Source: Ambulatory Visit | Attending: Nurse Practitioner | Admitting: Nurse Practitioner

## 2022-06-14 DIAGNOSIS — F17210 Nicotine dependence, cigarettes, uncomplicated: Secondary | ICD-10-CM | POA: Diagnosis present

## 2022-06-18 ENCOUNTER — Telehealth: Payer: Self-pay | Admitting: Nurse Practitioner

## 2022-06-18 NOTE — Progress Notes (Signed)
Please notify patient that her CT chest shows some areas in her lungs concerning for possible pneumonia. Could also be inflammation or residual changes from previous infection. She was treated with doxycycline course in July. Inquire as to if she is having any infectious symptoms of productive cough, chest congestion, fevers, chills. If so, we will treat her with an additional abx course. Thanks.

## 2022-06-19 ENCOUNTER — Other Ambulatory Visit (HOSPITAL_COMMUNITY): Payer: Self-pay

## 2022-06-19 ENCOUNTER — Telehealth: Payer: Self-pay

## 2022-06-19 NOTE — Telephone Encounter (Signed)
Patient Advocate Encounter   Received notification from [facility] that prior authorization is required for [facility]. PA submitted and APPROVED on 06/19/22.  Key [id number] Effective: 06/19/22 - 06/19/23  Burnell Blanks, CPhT Rx Patient Advocate Specialist Phone: [phone]

## 2022-06-20 MED ORDER — BREZTRI AEROSPHERE 160-9-4.8 MCG/ACT IN AERO: 2.0000 | INHALATION_SPRAY | Freq: Two times a day (BID) | RESPIRATORY_TRACT | 0 refills | Status: DC

## 2022-06-20 NOTE — Telephone Encounter (Signed)
Samples of breztri have been obtained and have been placed up front for pt. Attempted to call pt to let her know this had been done but unable to reach and unable to leave VM as mailbox is full.  Will try to call back later to let her know about the Breztri samples.

## 2022-06-21 NOTE — Telephone Encounter (Signed)
Attempted to call pt but unable to reach. Unable to leave VM as mailbox is full. Have sent pt a mychart message letting her know that the samples had been placed up front. Nothing further needed.

## 2022-06-25 ENCOUNTER — Ambulatory Visit: Payer: Medicaid Other | Admitting: Nurse Practitioner

## 2022-08-13 ENCOUNTER — Ambulatory Visit: Payer: Medicaid Other | Admitting: Primary Care

## 2022-08-26 ENCOUNTER — Other Ambulatory Visit: Payer: Self-pay

## 2022-08-26 ENCOUNTER — Emergency Department (HOSPITAL_COMMUNITY)
Admission: EM | Admit: 2022-08-26 | Discharge: 2022-08-26 | Disposition: A | Discharge status: Home / Self Care | Payer: Medicaid Other | Attending: Emergency Medicine | Admitting: Emergency Medicine

## 2022-08-26 ENCOUNTER — Emergency Department (HOSPITAL_COMMUNITY): Payer: Medicaid Other

## 2022-08-26 DIAGNOSIS — J441 Chronic obstructive pulmonary disease with (acute) exacerbation: Secondary | ICD-10-CM

## 2022-08-26 DIAGNOSIS — R079 Chest pain, unspecified: Secondary | ICD-10-CM | POA: Diagnosis not present

## 2022-08-26 DIAGNOSIS — Z20822 Contact with and (suspected) exposure to covid-19: Secondary | ICD-10-CM | POA: Insufficient documentation

## 2022-08-26 DIAGNOSIS — R0602 Shortness of breath: Secondary | ICD-10-CM | POA: Diagnosis present

## 2022-08-26 DIAGNOSIS — F17200 Nicotine dependence, unspecified, uncomplicated: Secondary | ICD-10-CM | POA: Diagnosis not present

## 2022-08-26 LAB — CBC WITH DIFFERENTIAL/PLATELET
Abs Immature Granulocytes: 0.03 10*3/uL (ref 0.00–0.07)
Basophils Absolute: 0.1 10*3/uL (ref 0.0–0.1)
Basophils Relative: 2 %
Eosinophils Absolute: 0.1 10*3/uL (ref 0.0–0.5)
Eosinophils Relative: 2 %
HCT: 45.5 % (ref 36.0–46.0)
Hemoglobin: 14.5 g/dL (ref 12.0–15.0)
Immature Granulocytes: 1 %
Lymphocytes Relative: 22 %
Lymphs Abs: 1.3 10*3/uL (ref 0.7–4.0)
MCH: 30.3 pg (ref 26.0–34.0)
MCHC: 31.9 g/dL (ref 30.0–36.0)
MCV: 95 fL (ref 80.0–100.0)
Monocytes Absolute: 0.3 10*3/uL (ref 0.1–1.0)
Monocytes Relative: 5 %
Neutro Abs: 4.2 10*3/uL (ref 1.7–7.7)
Neutrophils Relative %: 68 %
Platelets: 228 10*3/uL (ref 150–400)
RBC: 4.79 MIL/uL (ref 3.87–5.11)
RDW: 12.2 % (ref 11.5–15.5)
WBC: 6.1 10*3/uL (ref 4.0–10.5)
nRBC: 0 % (ref 0.0–0.2)

## 2022-08-26 LAB — BASIC METABOLIC PANEL
Anion gap: 9 (ref 5–15)
BUN: 10 mg/dL (ref 6–20)
CO2: 27 mmol/L (ref 22–32)
Calcium: 9.1 mg/dL (ref 8.9–10.3)
Chloride: 103 mmol/L (ref 98–111)
Creatinine, Ser: 0.94 mg/dL (ref 0.44–1.00)
GFR, Estimated: >60 mL/min (ref >60)
Glucose, Bld: 91 mg/dL (ref 70–99)
Potassium: 4.1 mmol/L (ref 3.5–5.1)
Sodium: 139 mmol/L (ref 135–145)

## 2022-08-26 LAB — TROPONIN I (HIGH SENSITIVITY)
Troponin I (High Sensitivity): 5 ng/L (ref <18)
Troponin I (High Sensitivity): 10 ng/L (ref <18)

## 2022-08-26 LAB — RESP PANEL BY RT-PCR (FLU A&B, COVID) ARPGX2
Influenza A by PCR: NEGATIVE
Influenza B by PCR: NEGATIVE
SARS Coronavirus 2 by RT PCR: NEGATIVE

## 2022-08-26 LAB — D-DIMER, QUANTITATIVE: D-Dimer, Quant: <0.27 ug/mL-FEU (ref 0.00–0.50)

## 2022-08-26 MED ORDER — PREDNISONE 20 MG PO TABS: 40.0000 mg | ORAL_TABLET | Freq: Every day | ORAL | 0 refills | Status: AC

## 2022-08-26 NOTE — ED Notes (Signed)
Pt ambulated around ED with O2 sat maintained @92 % on room air. Pt maintaining conversation throughout ambulation. Will notify EDP.

## 2022-08-26 NOTE — ED Triage Notes (Signed)
Per Kathleen Argue, husband called EMS states pt's SOB and work of breathing has worsened over the last week but this morning she was really struggling to breath.

## 2022-08-26 NOTE — ED Provider Notes (Signed)
[facility] EMERGENCY DEPARTMENT Provider Note   CSN: [id number] Arrival date & time: 08/26/22  4098     History  Chief Complaint  Patient presents with   Shortness of Breath    [name] is a 51 y.o. female with a PMHx of COPD, who presents to the ED brought in by EMS with concerns for shortness of breath onset thsi morning. Notes that she typically has trouble breathing in the monring. Has been speaking with her pulmonologist regarding this. She is not currently on oxygen. Has associated right sided chest pain that has been ongoing, however, increased within the past week.  Has associated rhinorrhea, nasal congestion, cough.  Tried her inhaler without relief of her symptoms. She is a current smoker, trying to quit smoking and has nicotine patches at home. Denies fever, abdominal pain, nausea, vomiting.  Denies past medical history of CHF.  Has a follow-up appointment with her pulmonologist at [facility] on 08/28/2022.   Per pt chart review: Patient was evaluated at her pulmonologist office on 05/25/2022.  At that time she was given a prednisone burst and doxycycline.  Patient is now on triple therapy with Breztri, albuterol inhaler, nebulizer.  Patient counseled on smoking cessation and provided with a nicotine patch.  At that time patient noted to be smoking 5 cigarettes daily.  Patient was informed to follow-up with her pulmonologist in 2 weeks for further evaluation of her symptoms.  CT chest at the time did not show any concerning findings.     The history is provided by the patient and the EMS personnel. No language interpreter was used.       Home Medications Prior to Admission medications   Medication Sig Start Date End Date Taking? Authorizing Provider  predniSONE (DELTASONE) 20 MG tablet Take 2 tablets (40 mg total) by mouth daily for 5 days. 08/26/22 08/31/22 Yes Shaunte Weissinger A, PA-C      Allergies    Patient has no known allergies.    Review of  Systems   Review of Systems  Constitutional:  Negative for fever.  HENT:  Positive for congestion and rhinorrhea.   Respiratory:  Positive for cough and shortness of breath.   Cardiovascular:  Positive for chest pain.  Gastrointestinal:  Negative for abdominal pain, nausea and vomiting.  All other systems reviewed and are negative.   Physical Exam Updated Vital Signs BP 104/64 (BP Location: Right Arm)   Pulse 66   Temp 98.1 F (36.7 C) (Oral)   Resp 20   Ht 5' (1.524 m)   Wt 64.9 kg   SpO2 96%   BMI 27.93 kg/m  Physical Exam Vitals and nursing note reviewed.  Constitutional:      General: She is not in acute distress.    Appearance: She is not diaphoretic.  HENT:     Head: Normocephalic and atraumatic.     Mouth/Throat:     Pharynx: No oropharyngeal exudate.  Eyes:     General: No scleral icterus.    Conjunctiva/sclera: Conjunctivae normal.  Cardiovascular:     Rate and Rhythm: Normal rate and regular rhythm.     Pulses: Normal pulses.     Heart sounds: Normal heart sounds.  Pulmonary:     Effort: Pulmonary effort is normal. No respiratory distress.     Breath sounds: Normal breath sounds. No wheezing.     Comments: Diffuse expiratory wheezing noted throughout lung fields.  Abdominal:     General: Bowel sounds are normal.  Palpations: Abdomen is soft. There is no mass.     Tenderness: There is no abdominal tenderness. There is no guarding or rebound.  Musculoskeletal:        General: Normal range of motion.     Cervical back: Normal range of motion and neck supple.     Right lower leg: No edema.     Left lower leg: No edema.     Comments: No pitting edema noted bilaterally.  Skin:    General: Skin is warm and dry.  Neurological:     Mental Status: She is alert.  Psychiatric:        Behavior: Behavior normal.     ED Results / Procedures / Treatments   Labs (all labs ordered are listed, but only abnormal results are displayed) Labs Reviewed  RESP  PANEL BY RT-PCR (FLU A&B, COVID) [id number]  BASIC METABOLIC PANEL  CBC WITH DIFFERENTIAL/PLATELET  D-DIMER, QUANTITATIVE  TROPONIN I (HIGH SENSITIVITY)  TROPONIN I (HIGH SENSITIVITY)    EKG EKG Interpretation  Date/Time:  Sunday August 26 2022 08:02:09 EDT Ventricular Rate:  55 PR Interval:  143 QRS Duration: 89 QT Interval:  440 QTC Calculation: 421 R Axis:   87 Text Interpretation: Sinus rhythm Anterior infarct, old No previous ECGs available Confirmed by Glynn Octave [phone]) on 08/26/2022 8:42:43 AM  Radiology DG Chest 2 View  Result Date: 08/26/2022 CLINICAL DATA:  51 year old female with history of shortness of breath. EXAM: CHEST - 2 VIEW COMPARISON:  Chest x-ray 05/25/2022. FINDINGS: Lung volumes are increased with emphysematous changes. No consolidative airspace disease. No pleural effusions. No pneumothorax. No pulmonary nodule or mass noted. Pulmonary vasculature and the cardiomediastinal silhouette are within normal limits. IMPRESSION: 1. No radiographic evidence of acute cardiopulmonary disease. 2. Emphysema. Electronically Signed   By: Trudie Reed M.D.   On: 08/26/2022 08:59    Procedures Procedures    Medications Ordered in ED Medications - No data to display  ED Course/ Medical Decision Making/ A&P Clinical Course as of 08/26/22 1330  Sun Aug 26, 2022  0800 Patient removed off of supplemental oxygen, sats holding at 96% on room air.  Patient notes improvement of breathing at this time. [SB]  0828 Pt evaluated and resting comfortably asleep on stretcher. Pt O2 sats on room air at 93%. [SB]  [id number] Re-evaluated and resting comfortable on stretcher. Lung sounds CTAB. Patient O2 at 96% on 2 L via nasal cannula. [SB]  1217 Notified by RN that patient ambulated well without supplemental oxygen and maintained O2 sats at 92%. Pt was asymptomatic and able to speak in clear complete sentences during ambulation.  [SB]  1240 Re-evaluated and asleep on stretcher. No  PMHx of PE/DVT, estrogen use, OCP, anticoagulant use, recent surgery. Pt notes improvement of her symptoms. She notes that when she was walking, she walked fine without difficulty. [SB]    Clinical Course User Index [SB] Cassey Hurrell A, PA-C                           Medical Decision Making Amount and/or Complexity of Data Reviewed Labs: ordered. Radiology: ordered.   Patient presents to the ED complaining of shortness of breath onset this morning.  Also notes associated right-sided chest pain.  Notes the chest pain is with exertion and denies chest pain at rest.  Vital signs patient afebrile, patient initially presenting on second DuoNeb on 8 L supplemental oxygen.  Patient does not wear oxygen at  baseline, notes that she has been speaking with her pulmonologist on if she should start oxygen due to her worsening symptoms in the morning that have been ongoing.  On exam patient with diminished lung sounds throughout, diffuse expiratory wheezing noted throughout lung fields.  No pitting edema noted bilaterally. Differential diagnosis includes COPD exacerbation, ACS, PTX, PNA, PE, COVID, flu.   Co morbidities that complicate the patient evaluation: COPD, emphysema  Additional history obtained:  Additional history obtained from EMS who notes that when they arrived patient was satting at 78% on room air.  Patient was given 2 DuoNebs, 125 mg of Solu-Medrol, 2 g of mag prior to arrival by EMS.  EMS notes that her O2 sat improved to 99% following treatments. External records from outside source obtained and reviewed including: Patient was evaluated at her pulmonologist office on 05/25/2022.  At that time she was given a prednisone burst and doxycycline.  Patient is now on triple therapy with Breztri, albuterol inhaler, nebulizer.  Patient counseled on smoking cessation and provided with a nicotine patch.  At that time patient noted to be smoking 5 cigarettes daily.  Patient was informed to follow-up with  her pulmonologist in 2 weeks for further evaluation of her symptoms.  CT chest at the time did not show any concerning findings.  Labs:  I ordered, and personally interpreted labs.  The pertinent results include:   COVID swab negative Flu swab negative Initial troponin at 5, delta troponin 10. CBC without leukocytosis and unremarkable. BMP unremarkable  Imaging: I ordered imaging studies including chest x-ray I independently visualized and interpreted imaging which showed:  1. No radiographic evidence of acute cardiopulmonary disease.  2. Emphysema.   I agree with the radiologist interpretation  Disposition: Presentation suspicious for COPD exacerbation. Doubt PTX, chest xray unremarkable at this time. Doubt PNA, COVID, flu at this time. Doubt PE, patient dimer negative. After consideration of the diagnostic results and the patients response to treatment, I feel that the patient would benefit from Discharge home. Case discussed with Attending who evaluated patient and agrees with discharge treatment plan with follow up with her Pulmonologist as scheduled on 08/28/22. Supportive care measures and strict return precautions discussed with patient at bedside. Pt acknowledges and verbalizes understanding. Pt appears safe for discharge. Follow up as indicated in discharge paperwork.    This chart was dictated using voice recognition software, Dragon. Despite the best efforts of this provider to proofread and correct errors, errors may still occur which can change documentation meaning.  Final Clinical Impression(s) / ED Diagnoses Final diagnoses:  COPD exacerbation ([place])    Rx / DC Orders ED Discharge Orders          Ordered    predniSONE (DELTASONE) 20 MG tablet  Daily        08/26/22 1324              Naheim Burgen A, PA-C 08/26/22 1412    Ezequiel Essex, MD 08/26/22 1647

## 2022-08-26 NOTE — Discharge Instructions (Addendum)
It was a pleasure taking care of you today!  Your labs, imaging, and EKG were unremarkable today.  It is important that you maintain your scheduled follow-up appointment with your pulmonologist on 08/28/2022.  You may continue taking your medications as prescribed.  You will be sent a prescription for prednisone, take as prescribed.  You may follow up with your primary care provider as needed. Return to the ED if you are experiencing increasing/worsening trouble breathing, chest pain, or worsening symptoms.

## 2022-08-26 NOTE — ED Notes (Signed)
Mouth swabs provided.

## 2022-08-28 ENCOUNTER — Ambulatory Visit: Payer: Medicaid Other | Admitting: Primary Care
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
## 2022-08-31 ENCOUNTER — Ambulatory Visit: Payer: Medicaid Other | Admitting: Internal Medicine
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
## 2022-09-04 ENCOUNTER — Ambulatory Visit: Payer: Medicaid Other | Admitting: Student

## 2022-09-10 ENCOUNTER — Telehealth: Payer: Self-pay | Admitting: Internal Medicine

## 2022-09-11 MED ORDER — BREZTRI AEROSPHERE 160-9-4.8 MCG/ACT IN AERO: 2.0000 | INHALATION_SPRAY | Freq: Two times a day (BID) | RESPIRATORY_TRACT | 0 refills | Status: DC

## 2022-09-11 NOTE — Telephone Encounter (Signed)
Tried to call pt back but line still went directly to VM. Unable to leave VM due to mailbox being full.

## 2022-09-11 NOTE — Telephone Encounter (Signed)
I have left 2 samples of breztri for pick up  Called the pt and there was no answer  VM full, Will call back

## 2022-09-12 NOTE — Telephone Encounter (Signed)
Called and spoke with patient. She stated that she came and picked the samples up.   Nothing further needed.

## 2022-09-14 ENCOUNTER — Ambulatory Visit: Payer: Medicaid Other | Admitting: Student

## 2022-09-25 ENCOUNTER — Ambulatory Visit: Payer: Medicaid Other | Admitting: Internal Medicine
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
## 2022-10-01 ENCOUNTER — Other Ambulatory Visit: Payer: Self-pay | Admitting: *Deleted

## 2022-10-01 DIAGNOSIS — J441 Chronic obstructive pulmonary disease with (acute) exacerbation: Secondary | ICD-10-CM

## 2022-10-01 MED ORDER — ALBUTEROL SULFATE HFA 108 (90 BASE) MCG/ACT IN AERS: 2.0000 | INHALATION_SPRAY | Freq: Four times a day (QID) | RESPIRATORY_TRACT | 3 refills | Status: DC | PRN

## 2022-10-03 ENCOUNTER — Ambulatory Visit: Payer: Medicaid Other | Admitting: Family Medicine

## 2022-10-07 ENCOUNTER — Ambulatory Visit (INDEPENDENT_AMBULATORY_CARE_PROVIDER_SITE_OTHER): Payer: Medicaid Other

## 2022-10-07 ENCOUNTER — Encounter: Payer: Self-pay | Admitting: Emergency Medicine

## 2022-10-07 ENCOUNTER — Other Ambulatory Visit: Payer: Self-pay

## 2022-10-07 ENCOUNTER — Ambulatory Visit
Admission: EM | Admit: 2022-10-07 | Discharge: 2022-10-07 | Disposition: A | Discharge status: Home / Self Care | Payer: Medicaid Other | Attending: Physician Assistant | Admitting: Physician Assistant

## 2022-10-07 DIAGNOSIS — M79641 Pain in right hand: Secondary | ICD-10-CM | POA: Diagnosis not present

## 2022-10-07 DIAGNOSIS — W19XXXA Unspecified fall, initial encounter: Secondary | ICD-10-CM | POA: Diagnosis not present

## 2022-10-07 DIAGNOSIS — S6991XA Unspecified injury of right wrist, hand and finger(s), initial encounter: Secondary | ICD-10-CM

## 2022-10-07 MED ORDER — IBUPROFEN 600 MG PO TABS
600.0000 mg | ORAL_TABLET | Freq: Four times a day (QID) | ORAL | 0 refills | Status: DC | PRN
Start: 2022-10-07 — End: 2023-01-17

## 2022-10-07 NOTE — ED Triage Notes (Signed)
Pt here for right hand pain after falling down stairs yesterday; pt sts pain and swelling
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
## 2022-11-13 ENCOUNTER — Telehealth: Payer: Self-pay | Admitting: Internal Medicine

## 2022-11-13 NOTE — Telephone Encounter (Signed)
ATC Can not leave message. 11/13/2022 @ 3:06 PM

## 2022-11-13 NOTE — Telephone Encounter (Signed)
Patient is returning phone call. Patient phone number is [phone].

## 2022-11-13 NOTE — Telephone Encounter (Signed)
PT said he [name] is out of [place]. Can we get another sample to her? Please call @ [phone]

## 2022-11-13 NOTE — Telephone Encounter (Signed)
Attempted to call pt, line rang once and then went to a busy signal. Unable to reach pt. Will try to call back later.

## 2022-11-14 MED ORDER — PREDNISONE 10 MG PO TABS: ORAL_TABLET | ORAL | 0 refills | Status: AC

## 2022-11-14 MED ORDER — AZITHROMYCIN 250 MG PO TABS: ORAL_TABLET | ORAL | 0 refills | Status: DC

## 2022-11-14 MED ORDER — BREZTRI AEROSPHERE 160-9-4.8 MCG/ACT IN AERO: 2.0000 | INHALATION_SPRAY | Freq: Two times a day (BID) | RESPIRATORY_TRACT | 4 refills | Status: DC

## 2022-11-14 MED ORDER — BREZTRI AEROSPHERE 160-9-4.8 MCG/ACT IN AERO: 2.0000 | INHALATION_SPRAY | Freq: Two times a day (BID) | RESPIRATORY_TRACT | 0 refills | Status: DC

## 2022-11-14 NOTE — Telephone Encounter (Signed)
Attempted to call pt to let her know recs per Dr. Melvyn Novas but unable to reach. Left pt a detailed message letting her know that we were sending zpak and pred taper to pharmacy. Nothing further needed.
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
## 2022-11-14 NOTE — Telephone Encounter (Signed)
Zpak Prednisone 10 mg take  4 each am x 2 days,   2 each am x 2 days,  1 each am x 2 days and stop   Keep  Ov with all meds in hand
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
## 2022-11-19 ENCOUNTER — Ambulatory Visit: Payer: Medicaid Other | Admitting: Internal Medicine

## 2022-11-19 DIAGNOSIS — N3010 Interstitial cystitis (chronic) without hematuria: Secondary | ICD-10-CM | POA: Diagnosis not present

## 2022-11-19 DIAGNOSIS — R109 Unspecified abdominal pain: Secondary | ICD-10-CM | POA: Diagnosis not present

## 2022-11-19 DIAGNOSIS — R110 Nausea: Secondary | ICD-10-CM | POA: Diagnosis not present

## 2022-11-21 ENCOUNTER — Telehealth: Payer: Self-pay | Admitting: Internal Medicine

## 2022-11-21 NOTE — Telephone Encounter (Signed)
If we have sample that would be better. She can not get until after the 15th due to Medicare Ins.

## 2022-11-21 NOTE — Telephone Encounter (Signed)
PT calling for a refill of what she called "Lanna Poche" I thnk it is [place]. Please call to advise if and when called in. TY. Verified Phn # OK.  Pharm; [facility] @ [address]
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
## 2022-11-21 NOTE — Telephone Encounter (Signed)
Advised patient samples of Breztri have been left upfront for her. Nothing further needed.

## 2022-11-22 ENCOUNTER — Ambulatory Visit: Payer: Medicaid Other | Admitting: Internal Medicine

## 2022-11-28 MED ORDER — NYSTATIN 100000 UNIT/ML MT SUSP: 5.0000 mL | Freq: Four times a day (QID) | OROMUCOSAL | 0 refills | Status: DC

## 2022-11-28 MED ORDER — BREZTRI AEROSPHERE 160-9-4.8 MCG/ACT IN AERO: 2.0000 | INHALATION_SPRAY | Freq: Two times a day (BID) | RESPIRATORY_TRACT | 4 refills | Status: DC

## 2022-11-28 NOTE — Telephone Encounter (Signed)
Called Pt to confirm that she wanted her Brezrtri and magic wash sent in to [facility] on [address]. Pt stated that is what she needed and nothing further is needed at this time.

## 2022-12-05 ENCOUNTER — Telehealth: Payer: Self-pay

## 2022-12-05 NOTE — Telephone Encounter (Signed)
Patient calls nurse line requesting Vitamin D refill.  She was being prescribed this medication by [facility]. She has appointment with PCP on 12/10/22. She states that she is starting to "feel the effects of not being on medication." Patient unsure of dosage amount. Called pharmacy and received directions. See below.   Vitamin D 50,000 units one capsule weekly.   Please advise.   Talbot Grumbling, RN

## 2022-12-06 ENCOUNTER — Other Ambulatory Visit: Payer: Self-pay | Admitting: Nurse Practitioner

## 2022-12-06 DIAGNOSIS — J441 Chronic obstructive pulmonary disease with (acute) exacerbation: Secondary | ICD-10-CM

## 2022-12-10 ENCOUNTER — Ambulatory Visit: Payer: Medicaid Other | Admitting: Student

## 2022-12-14 ENCOUNTER — Other Ambulatory Visit: Payer: Self-pay

## 2022-12-14 ENCOUNTER — Telehealth: Payer: Self-pay | Admitting: Internal Medicine

## 2022-12-14 DIAGNOSIS — J441 Chronic obstructive pulmonary disease with (acute) exacerbation: Secondary | ICD-10-CM

## 2022-12-14 DIAGNOSIS — R0609 Other forms of dyspnea: Secondary | ICD-10-CM

## 2022-12-14 MED ORDER — BREZTRI AEROSPHERE 160-9-4.8 MCG/ACT IN AERO: 2.0000 | INHALATION_SPRAY | Freq: Two times a day (BID) | RESPIRATORY_TRACT | 0 refills | Status: DC

## 2022-12-14 NOTE — Telephone Encounter (Signed)
ATC x 1  no voicemail set up

## 2022-12-14 NOTE — Telephone Encounter (Signed)
1 sample up front and pt on the way to pick up
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
## 2022-12-18 ENCOUNTER — Ambulatory Visit: Payer: Medicaid Other | Admitting: Internal Medicine

## 2022-12-19 ENCOUNTER — Ambulatory Visit: Payer: Medicaid Other | Admitting: Student

## 2022-12-27 DIAGNOSIS — N3010 Interstitial cystitis (chronic) without hematuria: Secondary | ICD-10-CM | POA: Diagnosis not present

## 2022-12-27 DIAGNOSIS — R110 Nausea: Secondary | ICD-10-CM | POA: Diagnosis not present

## 2022-12-27 DIAGNOSIS — R109 Unspecified abdominal pain: Secondary | ICD-10-CM | POA: Diagnosis not present
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
## 2023-01-01 ENCOUNTER — Ambulatory Visit: Payer: Medicaid Other | Admitting: Internal Medicine

## 2023-01-10 ENCOUNTER — Ambulatory Visit (HOSPITAL_COMMUNITY)
Admission: EM | Admit: 2023-01-10 | Discharge: 2023-01-10 | Discharge status: Against Medical Advice | Payer: Medicaid Other | Attending: Family Medicine | Admitting: Family Medicine

## 2023-01-11 ENCOUNTER — Telehealth: Payer: Self-pay | Admitting: Primary Care

## 2023-01-11 DIAGNOSIS — J441 Chronic obstructive pulmonary disease with (acute) exacerbation: Secondary | ICD-10-CM

## 2023-01-11 MED ORDER — ALBUTEROL SULFATE (2.5 MG/3ML) 0.083% IN NEBU: INHALATION_SOLUTION | RESPIRATORY_TRACT | 3 refills | Status: DC

## 2023-01-11 NOTE — Telephone Encounter (Signed)
Spoke with pt who is requesting a breztri and a z-pack and cough med. Pt c/o increased cough/ SOB/ wheezing. Pt denies fever/ chills/ GI upset. Breztri sample given to pt. Katie please advise.

## 2023-01-11 NOTE — Telephone Encounter (Signed)
Patient's husband came in office requesting samples. Nurse came out an spoke with patient's husband

## 2023-01-11 NOTE — Telephone Encounter (Signed)
[facility] on [address]  PT calling upset at long hold times. States she is SOB and coughing and thinks it is an infection in her lungs because her septic tank was full and she did not know it. Would like [place] to send in a [facility] for her. Please call ASAP. States she is desperate not to got hru the weekend feeling like she does.    Her # is [phone] or [phone]

## 2023-01-11 NOTE — Telephone Encounter (Signed)
Provided Breztri and pt is already scheduled for OV on 01/15/23. Pt instructed to go to UC if symtomps get worse.

## 2023-01-11 NOTE — Telephone Encounter (Signed)
Ok to give sample of Breztri. Does she need an updated prescription? It looks like we have given her multiple samples over the past few months. She was also just treated in January for AECOPD with z pack and prednisone. She needs to be seen in the office to be evaluated and determine if further abx is appropriate. If her symptoms worsen, she should go to UC or ED over the weekend.

## 2023-01-14 ENCOUNTER — Other Ambulatory Visit: Payer: Self-pay

## 2023-01-14 MED ORDER — BREZTRI AEROSPHERE 160-9-4.8 MCG/ACT IN AERO: 2.0000 | INHALATION_SPRAY | Freq: Two times a day (BID) | RESPIRATORY_TRACT | 0 refills | Status: DC

## 2023-01-15 ENCOUNTER — Ambulatory Visit: Payer: Medicaid Other | Admitting: Primary Care
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
## 2023-01-16 ENCOUNTER — Emergency Department (HOSPITAL_COMMUNITY): Payer: Medicaid Other

## 2023-01-16 ENCOUNTER — Encounter (HOSPITAL_COMMUNITY): Payer: Self-pay | Admitting: *Deleted

## 2023-01-16 ENCOUNTER — Inpatient Hospital Stay (HOSPITAL_COMMUNITY)
Admission: EM | Admit: 2023-01-16 | Discharge: 2023-01-18 | DRG: 194 | Disposition: A | Discharge status: Home / Self Care | Payer: Medicaid Other | Attending: Internal Medicine | Admitting: Internal Medicine

## 2023-01-16 ENCOUNTER — Ambulatory Visit: Payer: Medicaid Other | Admitting: Internal Medicine

## 2023-01-16 ENCOUNTER — Other Ambulatory Visit: Payer: Self-pay

## 2023-01-16 DIAGNOSIS — Z8719 Personal history of other diseases of the digestive system: Secondary | ICD-10-CM

## 2023-01-16 DIAGNOSIS — R7989 Other specified abnormal findings of blood chemistry: Secondary | ICD-10-CM | POA: Diagnosis present

## 2023-01-16 DIAGNOSIS — Z765 Malingerer [conscious simulation]: Secondary | ICD-10-CM

## 2023-01-16 DIAGNOSIS — R059 Cough, unspecified: Secondary | ICD-10-CM | POA: Diagnosis not present

## 2023-01-16 DIAGNOSIS — K219 Gastro-esophageal reflux disease without esophagitis: Secondary | ICD-10-CM | POA: Diagnosis present

## 2023-01-16 DIAGNOSIS — Z79899 Other long term (current) drug therapy: Secondary | ICD-10-CM

## 2023-01-16 DIAGNOSIS — F1911 Other psychoactive substance abuse, in remission: Secondary | ICD-10-CM | POA: Diagnosis present

## 2023-01-16 DIAGNOSIS — Z800 Family history of malignant neoplasm of digestive organs: Secondary | ICD-10-CM

## 2023-01-16 DIAGNOSIS — J440 Chronic obstructive pulmonary disease with acute lower respiratory infection: Secondary | ICD-10-CM | POA: Diagnosis present

## 2023-01-16 DIAGNOSIS — Z7951 Long term (current) use of inhaled steroids: Secondary | ICD-10-CM

## 2023-01-16 DIAGNOSIS — R0789 Other chest pain: Secondary | ICD-10-CM | POA: Diagnosis not present

## 2023-01-16 DIAGNOSIS — E039 Hypothyroidism, unspecified: Secondary | ICD-10-CM | POA: Diagnosis present

## 2023-01-16 DIAGNOSIS — X500XXA Overexertion from strenuous movement or load, initial encounter: Secondary | ICD-10-CM

## 2023-01-16 DIAGNOSIS — F17210 Nicotine dependence, cigarettes, uncomplicated: Secondary | ICD-10-CM | POA: Diagnosis present

## 2023-01-16 DIAGNOSIS — R0602 Shortness of breath: Secondary | ICD-10-CM | POA: Diagnosis not present

## 2023-01-16 DIAGNOSIS — Z885 Allergy status to narcotic agent status: Secondary | ICD-10-CM

## 2023-01-16 DIAGNOSIS — Z825 Family history of asthma and other chronic lower respiratory diseases: Secondary | ICD-10-CM

## 2023-01-16 DIAGNOSIS — S2232XA Fracture of one rib, left side, initial encounter for closed fracture: Secondary | ICD-10-CM | POA: Diagnosis present

## 2023-01-16 DIAGNOSIS — R079 Chest pain, unspecified: Secondary | ICD-10-CM | POA: Diagnosis not present

## 2023-01-16 DIAGNOSIS — J449 Chronic obstructive pulmonary disease, unspecified: Secondary | ICD-10-CM | POA: Diagnosis present

## 2023-01-16 DIAGNOSIS — J189 Pneumonia, unspecified organism: Secondary | ICD-10-CM | POA: Diagnosis not present

## 2023-01-16 DIAGNOSIS — Z823 Family history of stroke: Secondary | ICD-10-CM

## 2023-01-16 DIAGNOSIS — Z1152 Encounter for screening for COVID-19: Secondary | ICD-10-CM

## 2023-01-16 DIAGNOSIS — Z90710 Acquired absence of both cervix and uterus: Secondary | ICD-10-CM

## 2023-01-16 DIAGNOSIS — Z79890 Hormone replacement therapy (postmenopausal): Secondary | ICD-10-CM

## 2023-01-16 DIAGNOSIS — Z8711 Personal history of peptic ulcer disease: Secondary | ICD-10-CM

## 2023-01-16 DIAGNOSIS — F17200 Nicotine dependence, unspecified, uncomplicated: Secondary | ICD-10-CM | POA: Diagnosis present

## 2023-01-16 DIAGNOSIS — J441 Chronic obstructive pulmonary disease with (acute) exacerbation: Secondary | ICD-10-CM | POA: Diagnosis present

## 2023-01-16 DIAGNOSIS — F319 Bipolar disorder, unspecified: Secondary | ICD-10-CM | POA: Diagnosis present

## 2023-01-16 LAB — CBC
HCT: 44.9 % (ref 36.0–46.0)
Hemoglobin: 14.4 g/dL (ref 12.0–15.0)
MCH: 29 pg (ref 26.0–34.0)
MCHC: 32.1 g/dL (ref 30.0–36.0)
MCV: 90.5 fL (ref 80.0–100.0)
Platelets: 316 10*3/uL (ref 150–400)
RBC: 4.96 MIL/uL (ref 3.87–5.11)
RDW: 12 % (ref 11.5–15.5)
WBC: 7.6 10*3/uL (ref 4.0–10.5)
nRBC: 0 % (ref 0.0–0.2)

## 2023-01-16 LAB — BASIC METABOLIC PANEL
Anion gap: 12 (ref 5–15)
BUN: 8 mg/dL (ref 6–20)
CO2: 25 mmol/L (ref 22–32)
Calcium: 9.1 mg/dL (ref 8.9–10.3)
Chloride: 102 mmol/L (ref 98–111)
Creatinine, Ser: 0.71 mg/dL (ref 0.44–1.00)
GFR, Estimated: >60 mL/min (ref >60)
Glucose, Bld: 132 mg/dL — ABNORMAL HIGH (ref 70–99)
Potassium: 3.9 mmol/L (ref 3.5–5.1)
Sodium: 139 mmol/L (ref 135–145)

## 2023-01-16 LAB — TROPONIN I (HIGH SENSITIVITY): Troponin I (High Sensitivity): 79 ng/L — ABNORMAL HIGH (ref <18)

## 2023-01-16 NOTE — ED Triage Notes (Signed)
The pt is c/o chest pain and sob all day she has had a rapid heart  rate.  She has had this intermittent for 3 weeks
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
## 2023-01-17 ENCOUNTER — Emergency Department (HOSPITAL_COMMUNITY): Payer: Medicaid Other

## 2023-01-17 DIAGNOSIS — Z800 Family history of malignant neoplasm of digestive organs: Secondary | ICD-10-CM | POA: Diagnosis not present

## 2023-01-17 DIAGNOSIS — J441 Chronic obstructive pulmonary disease with (acute) exacerbation: Secondary | ICD-10-CM | POA: Diagnosis not present

## 2023-01-17 DIAGNOSIS — Z90710 Acquired absence of both cervix and uterus: Secondary | ICD-10-CM | POA: Diagnosis not present

## 2023-01-17 DIAGNOSIS — F17210 Nicotine dependence, cigarettes, uncomplicated: Secondary | ICD-10-CM | POA: Diagnosis not present

## 2023-01-17 DIAGNOSIS — X500XXA Overexertion from strenuous movement or load, initial encounter: Secondary | ICD-10-CM | POA: Diagnosis not present

## 2023-01-17 DIAGNOSIS — R0789 Other chest pain: Secondary | ICD-10-CM | POA: Diagnosis not present

## 2023-01-17 DIAGNOSIS — R0602 Shortness of breath: Secondary | ICD-10-CM | POA: Diagnosis not present

## 2023-01-17 DIAGNOSIS — S2232XA Fracture of one rib, left side, initial encounter for closed fracture: Secondary | ICD-10-CM | POA: Diagnosis present

## 2023-01-17 DIAGNOSIS — J189 Pneumonia, unspecified organism: Secondary | ICD-10-CM | POA: Diagnosis not present

## 2023-01-17 DIAGNOSIS — R079 Chest pain, unspecified: Secondary | ICD-10-CM | POA: Diagnosis not present

## 2023-01-17 DIAGNOSIS — Z8719 Personal history of other diseases of the digestive system: Secondary | ICD-10-CM | POA: Diagnosis not present

## 2023-01-17 DIAGNOSIS — R000 Tachycardia, unspecified: Secondary | ICD-10-CM | POA: Diagnosis not present

## 2023-01-17 DIAGNOSIS — Z8711 Personal history of peptic ulcer disease: Secondary | ICD-10-CM | POA: Diagnosis not present

## 2023-01-17 DIAGNOSIS — E039 Hypothyroidism, unspecified: Secondary | ICD-10-CM | POA: Diagnosis not present

## 2023-01-17 DIAGNOSIS — Z825 Family history of asthma and other chronic lower respiratory diseases: Secondary | ICD-10-CM | POA: Diagnosis not present

## 2023-01-17 DIAGNOSIS — Z7951 Long term (current) use of inhaled steroids: Secondary | ICD-10-CM | POA: Diagnosis not present

## 2023-01-17 DIAGNOSIS — R059 Cough, unspecified: Secondary | ICD-10-CM | POA: Diagnosis not present

## 2023-01-17 DIAGNOSIS — Z1152 Encounter for screening for COVID-19: Secondary | ICD-10-CM | POA: Diagnosis not present

## 2023-01-17 DIAGNOSIS — Z823 Family history of stroke: Secondary | ICD-10-CM | POA: Diagnosis not present

## 2023-01-17 DIAGNOSIS — R7989 Other specified abnormal findings of blood chemistry: Secondary | ICD-10-CM | POA: Diagnosis not present

## 2023-01-17 DIAGNOSIS — E038 Other specified hypothyroidism: Secondary | ICD-10-CM

## 2023-01-17 DIAGNOSIS — Z765 Malingerer [conscious simulation]: Secondary | ICD-10-CM | POA: Diagnosis not present

## 2023-01-17 DIAGNOSIS — Z79899 Other long term (current) drug therapy: Secondary | ICD-10-CM | POA: Diagnosis not present

## 2023-01-17 DIAGNOSIS — K219 Gastro-esophageal reflux disease without esophagitis: Secondary | ICD-10-CM | POA: Diagnosis not present

## 2023-01-17 DIAGNOSIS — Z79890 Hormone replacement therapy (postmenopausal): Secondary | ICD-10-CM | POA: Diagnosis not present

## 2023-01-17 DIAGNOSIS — F319 Bipolar disorder, unspecified: Secondary | ICD-10-CM | POA: Diagnosis not present

## 2023-01-17 DIAGNOSIS — F1911 Other psychoactive substance abuse, in remission: Secondary | ICD-10-CM

## 2023-01-17 DIAGNOSIS — Z885 Allergy status to narcotic agent status: Secondary | ICD-10-CM | POA: Diagnosis not present

## 2023-01-17 DIAGNOSIS — J440 Chronic obstructive pulmonary disease with acute lower respiratory infection: Secondary | ICD-10-CM | POA: Diagnosis not present

## 2023-01-17 LAB — PROCALCITONIN: Procalcitonin: <0.1 ng/mL

## 2023-01-17 LAB — HIV ANTIBODY (ROUTINE TESTING W REFLEX): HIV Screen 4th Generation wRfx: NONREACTIVE

## 2023-01-17 LAB — TSH: TSH: 4.016 u[IU]/mL (ref 0.350–4.500)

## 2023-01-17 LAB — TROPONIN I (HIGH SENSITIVITY): Troponin I (High Sensitivity): 76 ng/L — ABNORMAL HIGH (ref <18)

## 2023-01-17 MED ORDER — IOHEXOL 350 MG/ML SOLN
75.0000 mL | Freq: Once | INTRAVENOUS | Status: AC | PRN
  Administered 2023-01-17: 75 mL via INTRAVENOUS

## 2023-01-17 MED ORDER — ACETAMINOPHEN 650 MG RE SUPP: 650.0000 mg | Freq: Four times a day (QID) | RECTAL | Status: DC | PRN

## 2023-01-17 MED ORDER — LIDOCAINE 5 % EX PTCH
1.0000 | MEDICATED_PATCH | CUTANEOUS | Status: DC
  Administered 2023-01-17 – 2023-01-18 (×2): 1 via TRANSDERMAL
  Filled 2023-01-17 (×2): qty 1

## 2023-01-17 MED ORDER — ALBUTEROL SULFATE (2.5 MG/3ML) 0.083% IN NEBU: 2.5000 mg | INHALATION_SOLUTION | RESPIRATORY_TRACT | Status: DC | PRN

## 2023-01-17 MED ORDER — LORAZEPAM 1 MG PO TABS
2.0000 mg | ORAL_TABLET | Freq: Once | ORAL | Status: AC
  Administered 2023-01-17: 2 mg via ORAL
  Filled 2023-01-17: qty 2

## 2023-01-17 MED ORDER — SODIUM CHLORIDE 0.9% FLUSH
3.0000 mL | Freq: Two times a day (BID) | INTRAVENOUS | Status: DC
  Administered 2023-01-17 – 2023-01-18 (×3): 3 mL via INTRAVENOUS

## 2023-01-17 MED ORDER — BUDESONIDE 0.5 MG/2ML IN SUSP
0.5000 mg | Freq: Two times a day (BID) | RESPIRATORY_TRACT | Status: DC
  Administered 2023-01-17 – 2023-01-18 (×3): 0.5 mg via RESPIRATORY_TRACT
  Filled 2023-01-17 (×3): qty 2

## 2023-01-17 MED ORDER — PREDNISONE 20 MG PO TABS
40.0000 mg | ORAL_TABLET | Freq: Every day | ORAL | Status: DC
  Administered 2023-01-18: 40 mg via ORAL
  Filled 2023-01-17: qty 2

## 2023-01-17 MED ORDER — IPRATROPIUM-ALBUTEROL 0.5-2.5 (3) MG/3ML IN SOLN
3.0000 mL | Freq: Once | RESPIRATORY_TRACT | Status: AC
  Administered 2023-01-17: 3 mL via RESPIRATORY_TRACT
  Filled 2023-01-17: qty 3

## 2023-01-17 MED ORDER — ACETAMINOPHEN 325 MG PO TABS
650.0000 mg | ORAL_TABLET | Freq: Four times a day (QID) | ORAL | Status: DC | PRN
  Administered 2023-01-17 – 2023-01-18 (×2): 650 mg via ORAL
  Filled 2023-01-17 (×2): qty 2

## 2023-01-17 MED ORDER — ARFORMOTEROL TARTRATE 15 MCG/2ML IN NEBU
15.0000 ug | INHALATION_SOLUTION | Freq: Two times a day (BID) | RESPIRATORY_TRACT | Status: DC
  Administered 2023-01-17 – 2023-01-18 (×3): 15 ug via RESPIRATORY_TRACT
  Filled 2023-01-17 (×3): qty 2

## 2023-01-17 MED ORDER — LEVOTHYROXINE SODIUM 25 MCG PO TABS
125.0000 ug | ORAL_TABLET | Freq: Every day | ORAL | Status: DC
  Administered 2023-01-17 – 2023-01-18 (×2): 125 ug via ORAL
  Filled 2023-01-17 (×3): qty 1

## 2023-01-17 MED ORDER — TRAMADOL HCL 50 MG PO TABS
50.0000 mg | ORAL_TABLET | Freq: Four times a day (QID) | ORAL | Status: DC | PRN
  Administered 2023-01-17: 50 mg via ORAL
  Filled 2023-01-17: qty 1

## 2023-01-17 MED ORDER — ENOXAPARIN SODIUM 40 MG/0.4ML IJ SOSY
40.0000 mg | PREFILLED_SYRINGE | Freq: Every day | INTRAMUSCULAR | Status: DC
  Administered 2023-01-17 – 2023-01-18 (×2): 40 mg via SUBCUTANEOUS
  Filled 2023-01-17 (×2): qty 0.4

## 2023-01-17 MED ORDER — SODIUM CHLORIDE 0.9 % IV SOLN
2.0000 g | Freq: Once | INTRAVENOUS | Status: AC
  Administered 2023-01-17: 2 g via INTRAVENOUS
  Filled 2023-01-17: qty 20

## 2023-01-17 MED ORDER — METHYLPREDNISOLONE SODIUM SUCC 125 MG IJ SOLR
125.0000 mg | Freq: Once | INTRAMUSCULAR | Status: AC
  Administered 2023-01-17: 125 mg via INTRAVENOUS
  Filled 2023-01-17: qty 2

## 2023-01-17 MED ORDER — OXYCODONE HCL 5 MG PO TABS
5.0000 mg | ORAL_TABLET | Freq: Four times a day (QID) | ORAL | Status: DC | PRN
  Administered 2023-01-17 – 2023-01-18 (×3): 5 mg via ORAL
  Filled 2023-01-17 (×3): qty 1

## 2023-01-17 MED ORDER — SODIUM CHLORIDE 0.9 % IV SOLN
500.0000 mg | Freq: Once | INTRAVENOUS | Status: AC
  Administered 2023-01-17: 500 mg via INTRAVENOUS
  Filled 2023-01-17: qty 5

## 2023-01-17 MED ORDER — IPRATROPIUM-ALBUTEROL 0.5-2.5 (3) MG/3ML IN SOLN
3.0000 mL | Freq: Four times a day (QID) | RESPIRATORY_TRACT | Status: DC
  Administered 2023-01-17 – 2023-01-18 (×6): 3 mL via RESPIRATORY_TRACT
  Filled 2023-01-17 (×7): qty 3

## 2023-01-17 MED ORDER — HYDROXYZINE HCL 25 MG PO TABS
25.0000 mg | ORAL_TABLET | Freq: Three times a day (TID) | ORAL | Status: DC | PRN
  Administered 2023-01-17 – 2023-01-18 (×2): 25 mg via ORAL
  Filled 2023-01-17 (×2): qty 1

## 2023-01-17 MED ORDER — GUAIFENESIN ER 600 MG PO TB12
1200.0000 mg | ORAL_TABLET | Freq: Two times a day (BID) | ORAL | Status: DC
  Administered 2023-01-17 – 2023-01-18 (×3): 1200 mg via ORAL
  Filled 2023-01-17 (×3): qty 2
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
## 2023-01-17 NOTE — ED Notes (Signed)
Vitals taken but not  documented by accident  when discovered the pt was no longer in waiting  tech code needed to remove from vital sign machine  not available

## 2023-01-17 NOTE — ED Notes (Signed)
Patient left in stable condition, AOX4, no s/s of distress, with staff.

## 2023-01-18 DIAGNOSIS — J189 Pneumonia, unspecified organism: Principal | ICD-10-CM

## 2023-01-18 DIAGNOSIS — R079 Chest pain, unspecified: Secondary | ICD-10-CM

## 2023-01-18 LAB — CBC
HCT: 42.7 % (ref 36.0–46.0)
Hemoglobin: 14 g/dL (ref 12.0–15.0)
MCH: 29.5 pg (ref 26.0–34.0)
MCHC: 32.8 g/dL (ref 30.0–36.0)
MCV: 90.1 fL (ref 80.0–100.0)
Platelets: 299 10*3/uL (ref 150–400)
RBC: 4.74 MIL/uL (ref 3.87–5.11)
RDW: 12.1 % (ref 11.5–15.5)
WBC: 10.4 10*3/uL (ref 4.0–10.5)
nRBC: 0 % (ref 0.0–0.2)

## 2023-01-18 LAB — COMPREHENSIVE METABOLIC PANEL
ALT: 16 U/L (ref 0–44)
AST: 20 U/L (ref 15–41)
Albumin: 3 g/dL — ABNORMAL LOW (ref 3.5–5.0)
Alkaline Phosphatase: 54 U/L (ref 38–126)
Anion gap: 9 (ref 5–15)
BUN: 9 mg/dL (ref 6–20)
CO2: 26 mmol/L (ref 22–32)
Calcium: 8.7 mg/dL — ABNORMAL LOW (ref 8.9–10.3)
Chloride: 104 mmol/L (ref 98–111)
Creatinine, Ser: 0.77 mg/dL (ref 0.44–1.00)
GFR, Estimated: >60 mL/min (ref >60)
Glucose, Bld: 108 mg/dL — ABNORMAL HIGH (ref 70–99)
Potassium: 3.7 mmol/L (ref 3.5–5.1)
Sodium: 139 mmol/L (ref 135–145)
Total Bilirubin: 0.2 mg/dL — ABNORMAL LOW (ref 0.3–1.2)
Total Protein: 6.2 g/dL — ABNORMAL LOW (ref 6.5–8.1)

## 2023-01-18 LAB — RESP PANEL BY RT-PCR (RSV, FLU A&B, COVID)  RVPGX2
Influenza A by PCR: NEGATIVE
Influenza B by PCR: NEGATIVE
Resp Syncytial Virus by PCR: NEGATIVE
SARS Coronavirus 2 by RT PCR: NEGATIVE

## 2023-01-18 LAB — RAPID URINE DRUG SCREEN, HOSP PERFORMED
Amphetamines: NOT DETECTED
Barbiturates: NOT DETECTED
Benzodiazepines: NOT DETECTED
Cocaine: POSITIVE — AB
Opiates: NOT DETECTED
Tetrahydrocannabinol: POSITIVE — AB

## 2023-01-18 MED ORDER — PREDNISONE 10 MG PO TABS: ORAL_TABLET | ORAL | 0 refills | Status: AC

## 2023-01-18 MED ORDER — AZITHROMYCIN 250 MG PO TABS: ORAL_TABLET | ORAL | 0 refills | Status: DC

## 2023-01-18 MED ORDER — SODIUM CHLORIDE 0.9 % IV SOLN
2.0000 g | INTRAVENOUS | Status: DC
  Administered 2023-01-18: 2 g via INTRAVENOUS
  Filled 2023-01-18: qty 20

## 2023-01-18 MED ORDER — CEFDINIR 300 MG PO CAPS: 300.0000 mg | ORAL_CAPSULE | Freq: Two times a day (BID) | ORAL | 0 refills | Status: AC

## 2023-01-18 MED ORDER — HYDROXYZINE HCL 25 MG PO TABS: 25.0000 mg | ORAL_TABLET | Freq: Three times a day (TID) | ORAL | 0 refills | Status: DC | PRN

## 2023-01-18 MED ORDER — SODIUM CHLORIDE 0.9 % IV SOLN
500.0000 mg | INTRAVENOUS | Status: DC
  Administered 2023-01-18: 500 mg via INTRAVENOUS
  Filled 2023-01-18: qty 5
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
## 2023-01-18 NOTE — Progress Notes (Signed)
   01/18/23 1100  Mobility  Activity Ambulated independently in hallway  Level of Assistance Independent  Assistive Device None  Distance Ambulated (ft) 450 ft  Activity Response Tolerated well  Mobility Referral Yes  $Mobility charge 1 Mobility   Mobility Specialist Progress Note  Pre-Mobility: 96%SpO2 During Mobility: 95% SpO2 Post-Mobility: 95% SpO2  Pt was in bed and agreeable. X2 standing breaks d/t feeling SOB. Returned to bed w/ all needs met and call bell in reach.   Lucious Groves Mobility Specialist  Please contact via SecureChat or Rehab office at [place] Specialist  Please contact via [place] or [place] office at [phone]
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
## 2023-01-21 ENCOUNTER — Telehealth: Payer: Self-pay

## 2023-01-21 NOTE — Transitions of Care (Post Inpatient/ED Visit) (Unsigned)
   01/21/2023  Name: [name] MRN: [id number] DOB: [birth year]  Today's TOC FU Call Status: Today's TOC FU Call Status:: Unsuccessul Call (1st Attempt) Unsuccessful Call (1st Attempt) Date: 01/21/23  Attempted to reach the patient regarding the most recent Inpatient/ED visit.  Follow Up Plan: Additional outreach attempts will be made to reach the patient to complete the Transitions of Care (Post Inpatient/ED visit) call.   Signature Juanda Crumble, [place] Direct Dial [phone]

## 2023-01-22 NOTE — Transitions of Care (Post Inpatient/ED Visit) (Unsigned)
   01/22/2023  Name: MEE MACDONNELL MRN: [id number] DOB: 1971/04/25  Today's TOC FU Call Status: Today's TOC FU Call Status:: Unsuccessful Call (2nd Attempt) Unsuccessful Call (1st Attempt) Date: 01/21/23 Unsuccessful Call (2nd Attempt) Date: 01/22/23  Attempted to reach the patient regarding the most recent Inpatient/ED visit.  Follow Up Plan: Additional outreach attempts will be made to reach the patient to complete the Transitions of Care (Post Inpatient/ED visit) call.   Signature Juanda Crumble, [place] Direct Dial [phone]

## 2023-01-23 NOTE — Transitions of Care (Post Inpatient/ED Visit) (Unsigned)
   01/23/2023  Name: [name] MRN: [id number] DOB: 18-May-1971  Today's TOC FU Call Status: Today's TOC FU Call Status:: Unsuccessful Call (3rd Attempt) Unsuccessful Call (1st Attempt) Date: 01/21/23 Unsuccessful Call (2nd Attempt) Date: 01/22/23 Unsuccessful Call (3rd Attempt) Date: 01/23/23  Attempted to reach the patient regarding the most recent Inpatient/ED visit.  Follow Up Plan: Additional outreach attempts will be made to reach the patient to complete the Transitions of Care (Post Inpatient/ED visit) call.   Signature Juanda Crumble, [place] Direct Dial [phone]

## 2023-01-29 DIAGNOSIS — N3010 Interstitial cystitis (chronic) without hematuria: Secondary | ICD-10-CM | POA: Diagnosis not present

## 2023-01-30 ENCOUNTER — Ambulatory Visit: Payer: Medicaid Other | Admitting: Nurse Practitioner

## 2023-02-13 ENCOUNTER — Ambulatory Visit: Payer: Medicaid Other | Admitting: Nurse Practitioner

## 2023-02-18 ENCOUNTER — Telehealth: Payer: Self-pay | Admitting: Internal Medicine

## 2023-02-18 NOTE — Telephone Encounter (Signed)
Patient would like sample of Breztri. Also would like antiobotic for cough. Pharmacy is [facility] [address]. Patient phone number is [phone].

## 2023-02-18 NOTE — Telephone Encounter (Signed)
Called patient but she did not answer. Left message for her to call back.  

## 2023-02-19 DIAGNOSIS — N3010 Interstitial cystitis (chronic) without hematuria: Secondary | ICD-10-CM | POA: Diagnosis not present

## 2023-02-27 ENCOUNTER — Ambulatory Visit: Payer: Medicaid Other | Admitting: Nurse Practitioner

## 2023-02-27 ENCOUNTER — Encounter: Payer: Self-pay | Admitting: Nurse Practitioner

## 2023-02-27 ENCOUNTER — Ambulatory Visit (INDEPENDENT_AMBULATORY_CARE_PROVIDER_SITE_OTHER): Payer: Medicaid Other

## 2023-02-27 VITALS — BP 130/78 | HR 96 | Temp 98.0°F | Ht 60.0 in | Wt 117.4 lb

## 2023-02-27 DIAGNOSIS — R911 Solitary pulmonary nodule: Secondary | ICD-10-CM

## 2023-02-27 DIAGNOSIS — J441 Chronic obstructive pulmonary disease with (acute) exacerbation: Secondary | ICD-10-CM | POA: Diagnosis not present

## 2023-02-27 DIAGNOSIS — J189 Pneumonia, unspecified organism: Secondary | ICD-10-CM

## 2023-02-27 MED ORDER — PREDNISONE 10 MG PO TABS: ORAL_TABLET | ORAL | 0 refills | Status: DC

## 2023-02-27 MED ORDER — GUAIFENESIN-DM 100-10 MG/5ML PO SYRP: 5.0000 mL | ORAL_SOLUTION | Freq: Four times a day (QID) | ORAL | 0 refills | Status: DC | PRN

## 2023-02-27 MED ORDER — BENZONATATE 200 MG PO CAPS: 200.0000 mg | ORAL_CAPSULE | Freq: Three times a day (TID) | ORAL | 1 refills | Status: DC | PRN

## 2023-02-27 NOTE — Patient Instructions (Addendum)
Continue Albuterol inhaler 2 puffs or 3 mL neb every 6 hours as needed for shortness of breath or wheezing. Notify if symptoms persist despite rescue inhaler/neb use. Use nebs at least twice a day followed by flutter valve until symptoms improve Continue breztri 2 puffs Twice daily. Brush tongue and rinse mouth afterwards. Complete patient assistance  Guaifenesin DM cough syrup 5 mL every 6 hours as needed for cough Benzonatate 1 capsule Three times a day as needed for cough Prednisone taper. 4 tabs for 2 days, then 3 tabs for 2 days, 2 tabs for 2 days, then 1 tab for 2 days, then stop. Take in AM with food.   Collect sputum cultures and return tomorrow Labs tomorrow  CT chest ordered - someone will contact you to schedule this.   Follow up in one week with Dr. Sherene Sires or Philis Nettle. If symptoms do not improve or worsen, please contact office for sooner follow up or seek emergency care.

## 2023-02-27 NOTE — Assessment & Plan Note (Signed)
>>  ASSESSMENT AND PLAN FOR COPD WITH ACUTE EXACERBATION ([facility]) WRITTEN ON 02/27/2023  9:11 PM BY COBB, KATHERINE V, NP  Unresolved AECOPD. See above. We will treat her with prednisone taper. Afebrile, non-toxic and VS stable. Fatigue and anorexia appear to be more chronic in nature. Awaiting CT then will determine if repeat abx course indicated.

## 2023-02-27 NOTE — Assessment & Plan Note (Signed)
Unresolved AECOPD. See above. We will treat her with prednisone taper. Afebrile, non-toxic and VS stable. Fatigue and anorexia appear to be more chronic in nature. Awaiting CT then will determine if repeat abx course indicated.

## 2023-02-27 NOTE — Assessment & Plan Note (Addendum)
Persistent nodular opacity in the LUL on repeat CXR today. Question infectious/inflammatory process given her persistent productive cough vs malignancy with significant smoking history, anorexia, weight loss and lack of improvement despite previous abx. We will obtain CT imaging for further evaluation, obtain sputum cultures, and labs today to assess for worsening leukocytosis. Mucociliary clearance therapies recommended. Close follow up. Had a very lengthy discussion with the patient regarding the importance of follow up and compliance with medications.  Patient Instructions  Continue Albuterol inhaler 2 puffs or 3 mL neb every 6 hours as needed for shortness of breath or wheezing. Notify if symptoms persist despite rescue inhaler/neb use. Use nebs at least twice a day followed by flutter valve until symptoms improve Continue breztri 2 puffs Twice daily. Brush tongue and rinse mouth afterwards. Complete patient assistance  Guaifenesin DM cough syrup 5 mL every 6 hours as needed for cough Benzonatate 1 capsule Three times a day as needed for cough Prednisone taper. 4 tabs for 2 days, then 3 tabs for 2 days, 2 tabs for 2 days, then 1 tab for 2 days, then stop. Take in AM with food.   Collect sputum cultures and return tomorrow Labs tomorrow  CT chest ordered - someone will contact you to schedule this.   Follow up in one week with Dr. Sherene Sires or Philis Nettle. If symptoms do not improve or worsen, please contact office for sooner follow up or seek emergency care.
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
## 2023-02-28 ENCOUNTER — Ambulatory Visit (HOSPITAL_COMMUNITY): Admission: RE | Admit: 2023-02-28 | Payer: Medicaid Other | Source: Ambulatory Visit

## 2023-02-28 NOTE — Telephone Encounter (Signed)
Since pt called the office, she was seen for an office visit. Closing encounter.

## 2023-03-01 ENCOUNTER — Encounter: Payer: Self-pay | Admitting: Nurse Practitioner

## 2023-03-01 ENCOUNTER — Ambulatory Visit (HOSPITAL_COMMUNITY)
Admission: RE | Admit: 2023-03-01 | Discharge: 2023-03-01 | Disposition: A | Discharge status: Home / Self Care | Payer: Medicaid Other | Source: Ambulatory Visit | Attending: Nurse Practitioner | Admitting: Nurse Practitioner

## 2023-03-01 ENCOUNTER — Other Ambulatory Visit (INDEPENDENT_AMBULATORY_CARE_PROVIDER_SITE_OTHER): Payer: Medicaid Other

## 2023-03-01 DIAGNOSIS — J189 Pneumonia, unspecified organism: Secondary | ICD-10-CM | POA: Diagnosis not present

## 2023-03-01 DIAGNOSIS — J439 Emphysema, unspecified: Secondary | ICD-10-CM | POA: Diagnosis not present

## 2023-03-01 DIAGNOSIS — J441 Chronic obstructive pulmonary disease with (acute) exacerbation: Secondary | ICD-10-CM

## 2023-03-01 LAB — BASIC METABOLIC PANEL
BUN: 5 mg/dL — ABNORMAL LOW (ref 6–23)
CO2: 31 mEq/L (ref 19–32)
Calcium: 9.3 mg/dL (ref 8.4–10.5)
Chloride: 99 mEq/L (ref 96–112)
Creatinine, Ser: 0.7 mg/dL (ref 0.40–1.20)
GFR: 100.55 mL/min (ref >60.00)
Glucose, Bld: 92 mg/dL (ref 70–99)
Potassium: 3.3 mEq/L — ABNORMAL LOW (ref 3.5–5.1)
Sodium: 136 mEq/L (ref 135–145)

## 2023-03-01 LAB — CBC WITH DIFFERENTIAL/PLATELET
Basophils Absolute: 0.1 10*3/uL (ref 0.0–0.1)
Basophils Relative: 1.3 % (ref 0.0–3.0)
Eosinophils Absolute: 0.1 10*3/uL (ref 0.0–0.7)
Eosinophils Relative: 2.5 % (ref 0.0–5.0)
HCT: 41.6 % (ref 36.0–46.0)
Hemoglobin: 14.1 g/dL (ref 12.0–15.0)
Lymphocytes Relative: 30.1 % (ref 12.0–46.0)
Lymphs Abs: 1.8 10*3/uL (ref 0.7–4.0)
MCHC: 33.8 g/dL (ref 30.0–36.0)
MCV: 87.2 fl (ref 78.0–100.0)
Monocytes Absolute: 0.5 10*3/uL (ref 0.1–1.0)
Monocytes Relative: 7.6 % (ref 3.0–12.0)
Neutro Abs: 3.5 10*3/uL (ref 1.4–7.7)
Neutrophils Relative %: 58.5 % (ref 43.0–77.0)
Platelets: 245 10*3/uL (ref 150.0–400.0)
RBC: 4.77 Mil/uL (ref 3.87–5.11)
RDW: 13.3 % (ref 11.5–15.5)
WBC: 6 10*3/uL (ref 4.0–10.5)

## 2023-03-01 MED ORDER — IOHEXOL 350 MG/ML SOLN
50.0000 mL | Freq: Once | INTRAVENOUS | Status: AC | PRN
  Administered 2023-03-01: 50 mL via INTRAVENOUS

## 2023-03-04 ENCOUNTER — Telehealth: Payer: Self-pay | Admitting: Nurse Practitioner

## 2023-03-04 DIAGNOSIS — R062 Wheezing: Secondary | ICD-10-CM | POA: Diagnosis not present

## 2023-03-04 DIAGNOSIS — R001 Bradycardia, unspecified: Secondary | ICD-10-CM | POA: Diagnosis not present

## 2023-03-04 DIAGNOSIS — R0902 Hypoxemia: Secondary | ICD-10-CM | POA: Diagnosis not present

## 2023-03-04 NOTE — Telephone Encounter (Signed)
Pt. Calling saying she is still coughing and need some meds to help please advise

## 2023-03-04 NOTE — Telephone Encounter (Signed)
Called patient x2, call would not go through. Will call back later.

## 2023-03-07 NOTE — Telephone Encounter (Signed)
Patient states [name] is too expensive at pharmacy. Would like something else called in. Pharmacy is [facility] [address].Would like sample of Breztri.  Patient phone number is [phone].

## 2023-03-07 NOTE — Telephone Encounter (Signed)
Pt can get tessalon at a cheaper price if she uses a GoodRx card.  Attempted to call pt but unable to reach and unable to leave VM. Will try to call back later.

## 2023-03-07 NOTE — Progress Notes (Signed)
Potassium slightly decreased. She's not taking anything that would cause this from what I can see. Please have her take two doses of potassium chloride 20 meq. We will recheck next week. Thanks.

## 2023-03-07 NOTE — Progress Notes (Signed)
CT improving with resolving pneumonia. No suspicious nodules or masses, which is good news. Please inquire as to if she has improved since our visit or if she continues to have a productive cough with discolored phlegm. Thanks!

## 2023-03-07 NOTE — Telephone Encounter (Signed)
Patient checking on message sent. Patient phone number is [phone].

## 2023-03-08 ENCOUNTER — Other Ambulatory Visit: Payer: Self-pay

## 2023-03-08 ENCOUNTER — Encounter (HOSPITAL_COMMUNITY): Payer: Self-pay

## 2023-03-08 ENCOUNTER — Inpatient Hospital Stay (HOSPITAL_COMMUNITY)
Admission: EM | Admit: 2023-03-08 | Discharge: 2023-03-10 | DRG: 190 | Disposition: A | Discharge status: Home / Self Care | Payer: Medicaid Other | Attending: Student in an Organized Health Care Education/Training Program | Admitting: Student in an Organized Health Care Education/Training Program

## 2023-03-08 ENCOUNTER — Emergency Department (HOSPITAL_COMMUNITY): Payer: Medicaid Other

## 2023-03-08 DIAGNOSIS — J449 Chronic obstructive pulmonary disease, unspecified: Secondary | ICD-10-CM | POA: Diagnosis present

## 2023-03-08 DIAGNOSIS — J441 Chronic obstructive pulmonary disease with (acute) exacerbation: Secondary | ICD-10-CM | POA: Diagnosis not present

## 2023-03-08 DIAGNOSIS — Z79899 Other long term (current) drug therapy: Secondary | ICD-10-CM

## 2023-03-08 DIAGNOSIS — Z7952 Long term (current) use of systemic steroids: Secondary | ICD-10-CM

## 2023-03-08 DIAGNOSIS — Z7951 Long term (current) use of inhaled steroids: Secondary | ICD-10-CM

## 2023-03-08 DIAGNOSIS — Z716 Tobacco abuse counseling: Secondary | ICD-10-CM

## 2023-03-08 DIAGNOSIS — D72829 Elevated white blood cell count, unspecified: Secondary | ICD-10-CM | POA: Diagnosis not present

## 2023-03-08 DIAGNOSIS — E890 Postprocedural hypothyroidism: Secondary | ICD-10-CM | POA: Diagnosis present

## 2023-03-08 DIAGNOSIS — Z7982 Long term (current) use of aspirin: Secondary | ICD-10-CM

## 2023-03-08 DIAGNOSIS — J439 Emphysema, unspecified: Secondary | ICD-10-CM | POA: Diagnosis present

## 2023-03-08 DIAGNOSIS — F1290 Cannabis use, unspecified, uncomplicated: Secondary | ICD-10-CM | POA: Diagnosis present

## 2023-03-08 DIAGNOSIS — Z79890 Hormone replacement therapy (postmenopausal): Secondary | ICD-10-CM

## 2023-03-08 DIAGNOSIS — Z885 Allergy status to narcotic agent status: Secondary | ICD-10-CM

## 2023-03-08 DIAGNOSIS — G894 Chronic pain syndrome: Secondary | ICD-10-CM | POA: Diagnosis present

## 2023-03-08 DIAGNOSIS — K219 Gastro-esophageal reflux disease without esophagitis: Secondary | ICD-10-CM | POA: Diagnosis present

## 2023-03-08 DIAGNOSIS — F319 Bipolar disorder, unspecified: Secondary | ICD-10-CM | POA: Diagnosis present

## 2023-03-08 DIAGNOSIS — F411 Generalized anxiety disorder: Secondary | ICD-10-CM | POA: Diagnosis present

## 2023-03-08 DIAGNOSIS — E872 Acidosis, unspecified: Secondary | ICD-10-CM | POA: Diagnosis present

## 2023-03-08 DIAGNOSIS — F17200 Nicotine dependence, unspecified, uncomplicated: Secondary | ICD-10-CM | POA: Diagnosis present

## 2023-03-08 DIAGNOSIS — T380X5A Adverse effect of glucocorticoids and synthetic analogues, initial encounter: Secondary | ICD-10-CM | POA: Diagnosis not present

## 2023-03-08 DIAGNOSIS — F17210 Nicotine dependence, cigarettes, uncomplicated: Secondary | ICD-10-CM | POA: Diagnosis present

## 2023-03-08 DIAGNOSIS — J9601 Acute respiratory failure with hypoxia: Principal | ICD-10-CM

## 2023-03-08 DIAGNOSIS — R0602 Shortness of breath: Secondary | ICD-10-CM | POA: Diagnosis not present

## 2023-03-08 DIAGNOSIS — Z825 Family history of asthma and other chronic lower respiratory diseases: Secondary | ICD-10-CM

## 2023-03-08 LAB — CBC WITH DIFFERENTIAL/PLATELET
Abs Immature Granulocytes: 0.02 10*3/uL (ref 0.00–0.07)
Basophils Absolute: 0 10*3/uL (ref 0.0–0.1)
Basophils Relative: 1 %
Eosinophils Absolute: 0 10*3/uL (ref 0.0–0.5)
Eosinophils Relative: 0 %
HCT: 47.1 % — ABNORMAL HIGH (ref 36.0–46.0)
Hemoglobin: 14.6 g/dL (ref 12.0–15.0)
Immature Granulocytes: 0 %
Lymphocytes Relative: 23 %
Lymphs Abs: 1.1 10*3/uL (ref 0.7–4.0)
MCH: 29.1 pg (ref 26.0–34.0)
MCHC: 31 g/dL (ref 30.0–36.0)
MCV: 94 fL (ref 80.0–100.0)
Monocytes Absolute: 0.3 10*3/uL (ref 0.1–1.0)
Monocytes Relative: 5 %
Neutro Abs: 3.6 10*3/uL (ref 1.7–7.7)
Neutrophils Relative %: 71 %
Platelets: 216 10*3/uL (ref 150–400)
RBC: 5.01 MIL/uL (ref 3.87–5.11)
RDW: 12.6 % (ref 11.5–15.5)
WBC: 5.1 10*3/uL (ref 4.0–10.5)
nRBC: 0 % (ref 0.0–0.2)

## 2023-03-08 LAB — BRAIN NATRIURETIC PEPTIDE: B Natriuretic Peptide: 166.6 pg/mL — ABNORMAL HIGH (ref 0.0–100.0)

## 2023-03-08 LAB — BASIC METABOLIC PANEL
Anion gap: 9 (ref 5–15)
BUN: 6 mg/dL (ref 6–20)
CO2: 25 mmol/L (ref 22–32)
Calcium: 9 mg/dL (ref 8.9–10.3)
Chloride: 102 mmol/L (ref 98–111)
Creatinine, Ser: 0.84 mg/dL (ref 0.44–1.00)
GFR, Estimated: >60 mL/min (ref >60)
Glucose, Bld: 170 mg/dL — ABNORMAL HIGH (ref 70–99)
Potassium: 4.3 mmol/L (ref 3.5–5.1)
Sodium: 136 mmol/L (ref 135–145)

## 2023-03-08 LAB — RAPID URINE DRUG SCREEN, HOSP PERFORMED
Amphetamines: NOT DETECTED
Barbiturates: NOT DETECTED
Benzodiazepines: NOT DETECTED
Cocaine: POSITIVE — AB
Opiates: NOT DETECTED
Tetrahydrocannabinol: NOT DETECTED

## 2023-03-08 LAB — URINALYSIS, ROUTINE W REFLEX MICROSCOPIC
Bilirubin Urine: NEGATIVE
Glucose, UA: NEGATIVE mg/dL
Hgb urine dipstick: NEGATIVE
Ketones, ur: NEGATIVE mg/dL
Leukocytes,Ua: NEGATIVE
Nitrite: NEGATIVE
Protein, ur: NEGATIVE mg/dL
Specific Gravity, Urine: 1.011 (ref 1.005–1.030)
pH: 6 (ref 5.0–8.0)

## 2023-03-08 LAB — TROPONIN I (HIGH SENSITIVITY)
Troponin I (High Sensitivity): 7 ng/L (ref <18)
Troponin I (High Sensitivity): 11 ng/L (ref <18)

## 2023-03-08 LAB — LACTIC ACID, PLASMA
Lactic Acid, Venous: 3.4 mmol/L (ref 0.5–1.9)
Lactic Acid, Venous: 1.3 mmol/L (ref 0.5–1.9)

## 2023-03-08 MED ORDER — HYDROXYZINE HCL 25 MG PO TABS
25.0000 mg | ORAL_TABLET | Freq: Three times a day (TID) | ORAL | Status: DC | PRN
  Administered 2023-03-08: 25 mg via ORAL
  Filled 2023-03-08 (×2): qty 1

## 2023-03-08 MED ORDER — ACETAMINOPHEN 650 MG RE SUPP: 650.0000 mg | Freq: Four times a day (QID) | RECTAL | Status: DC | PRN

## 2023-03-08 MED ORDER — IPRATROPIUM-ALBUTEROL 0.5-2.5 (3) MG/3ML IN SOLN
3.0000 mL | Freq: Three times a day (TID) | RESPIRATORY_TRACT | Status: DC
  Administered 2023-03-09 – 2023-03-10 (×4): 3 mL via RESPIRATORY_TRACT
  Filled 2023-03-08 (×4): qty 3

## 2023-03-08 MED ORDER — LEVOTHYROXINE SODIUM 25 MCG PO TABS
125.0000 ug | ORAL_TABLET | Freq: Every day | ORAL | Status: DC
  Administered 2023-03-09 – 2023-03-10 (×2): 125 ug via ORAL
  Filled 2023-03-08 (×2): qty 1

## 2023-03-08 MED ORDER — IPRATROPIUM-ALBUTEROL 0.5-2.5 (3) MG/3ML IN SOLN
3.0000 mL | Freq: Four times a day (QID) | RESPIRATORY_TRACT | Status: DC
  Administered 2023-03-08 (×2): 3 mL via RESPIRATORY_TRACT
  Filled 2023-03-08 (×2): qty 3

## 2023-03-08 MED ORDER — PREDNISONE 20 MG PO TABS
40.0000 mg | ORAL_TABLET | Freq: Every day | ORAL | Status: DC
  Administered 2023-03-09 – 2023-03-10 (×2): 40 mg via ORAL
  Filled 2023-03-08 (×2): qty 2

## 2023-03-08 MED ORDER — FLUTICASONE FUROATE-VILANTEROL 200-25 MCG/ACT IN AEPB
1.0000 | INHALATION_SPRAY | Freq: Every day | RESPIRATORY_TRACT | Status: DC
  Administered 2023-03-08 – 2023-03-10 (×3): 1 via RESPIRATORY_TRACT
  Filled 2023-03-08: qty 28

## 2023-03-08 MED ORDER — SODIUM CHLORIDE 0.9 % IV SOLN
1.0000 g | INTRAVENOUS | Status: DC
  Administered 2023-03-08: 1 g via INTRAVENOUS
  Filled 2023-03-08: qty 10

## 2023-03-08 MED ORDER — RIVAROXABAN 10 MG PO TABS
10.0000 mg | ORAL_TABLET | Freq: Every day | ORAL | Status: DC
  Administered 2023-03-08 – 2023-03-10 (×3): 10 mg via ORAL
  Filled 2023-03-08 (×3): qty 1

## 2023-03-08 MED ORDER — SODIUM CHLORIDE 0.9 % IV BOLUS
1000.0000 mL | Freq: Once | INTRAVENOUS | Status: AC
  Administered 2023-03-08: 1000 mL via INTRAVENOUS

## 2023-03-08 MED ORDER — HYDROXYZINE HCL 10 MG PO TABS: 10.0000 mg | ORAL_TABLET | Freq: Three times a day (TID) | ORAL | Status: DC | PRN

## 2023-03-08 MED ORDER — BUDESON-GLYCOPYRROL-FORMOTEROL 160-9-4.8 MCG/ACT IN AERO: 2.0000 | INHALATION_SPRAY | Freq: Two times a day (BID) | RESPIRATORY_TRACT | Status: DC

## 2023-03-08 MED ORDER — UMECLIDINIUM BROMIDE 62.5 MCG/ACT IN AEPB
1.0000 | INHALATION_SPRAY | Freq: Every day | RESPIRATORY_TRACT | Status: DC
  Administered 2023-03-08 – 2023-03-10 (×3): 1 via RESPIRATORY_TRACT
  Filled 2023-03-08: qty 7

## 2023-03-08 MED ORDER — POLYETHYLENE GLYCOL 3350 17 G PO PACK: 17.0000 g | PACK | Freq: Every day | ORAL | Status: DC | PRN

## 2023-03-08 MED ORDER — AZITHROMYCIN 250 MG PO TABS
500.0000 mg | ORAL_TABLET | Freq: Every day | ORAL | Status: DC
  Administered 2023-03-08 – 2023-03-09 (×2): 500 mg via ORAL
  Filled 2023-03-08 (×4): qty 2

## 2023-03-08 MED ORDER — ACETAMINOPHEN 325 MG PO TABS: 650.0000 mg | ORAL_TABLET | Freq: Four times a day (QID) | ORAL | Status: DC | PRN
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
## 2023-03-08 NOTE — Progress Notes (Signed)
She has COPD and continues to smoke which is likely contributing to her lack of improvement. I am also not sure if she is using her inhaler on a consistent basis. She is in the ED so recommend letting them evaluate her and appropriately treat her. We will see her after discharge. Thanks.

## 2023-03-08 NOTE — Progress Notes (Signed)
I called and spoke with the pt's daughter and notified of response per [name]. She verbalized understanding.
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
## 2023-03-08 NOTE — ED Notes (Signed)
Critical lactic 3.4. MD made aware.

## 2023-03-08 NOTE — Progress Notes (Signed)
Savanah, pt's daughter aware of results.
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
## 2023-03-08 NOTE — ED Provider Notes (Signed)
[place] EMERGENCY DEPARTMENT AT [facility] Provider Note   CSN: [id number] Arrival date & time: 03/08/23  1044     History  Chief Complaint  Patient presents with   Respiratory Distress    [name] is a 52 y.o. female with a past medical history of COPD who presents emergency department brought in by EMS with concerns for respiratory distress onset today.  Per EMS, they were called out due to patient being apneic with cyanosis.  They began to bag the patient and provide oxygen with resolution of patient's apnea and cyanosis.  They provided patient with magnesium, Solu-Medrol, DuoNeb, patient is on her second DuoNeb.  Patient notes that her symptoms with her breathing started 2 weeks ago.  She has tried her prescription inhalers without relief of her symptoms.  Has associated left-sided chest pain.  Denies abdominal pain, nausea, vomiting.  Patient notes that she was treated with antibiotics for pneumonia with her last dose being last week.  The history is provided by the patient. No language interpreter was used.       Home Medications Prior to Admission medications   Medication Sig Start Date End Date Taking? Authorizing Provider  albuterol (PROVENTIL) (2.5 MG/3ML) 0.083% nebulizer solution USE 2 VIALS VIA NEBULIZER EVERY 4 HOURS AS NEEDED FOR WHEEZING, SHORTNESS OF BREATH Patient taking differently: Take 5 mg by nebulization every 4 (four) hours as needed for shortness of breath or wheezing. 01/11/23  Yes Cobb, Ruby Cola, NP  albuterol (VENTOLIN HFA) 108 (90 Base) MCG/ACT inhaler Inhale 2 puffs into the lungs every 6 (six) hours as needed for wheezing or shortness of breath. 10/01/22  Yes [name], MD  ASPIRIN EC PO Take 1 tablet by mouth daily as needed (chest pain).   Yes [provider]  Budeson-Glycopyrrol-Formoterol (BREZTRI AEROSPHERE) 160-9-4.8 MCG/ACT AERO Inhale 2 puffs into the lungs in the morning and at bedtime. 01/14/23  Yes Cobb,  Ruby Cola, NP  levothyroxine (SYNTHROID) 125 MCG tablet Take 1 tablet (125 mcg total) by mouth daily. 05/22/22  Yes Alicia Amel, MD  PRESCRIPTION MEDICATION Unknown antibiotic.   Yes [provider]  azithromycin (ZITHROMAX) 250 MG tablet 1 tab po daily x 4 more days, zero refills Patient not taking: Reported on 02/27/2023 01/19/23   Jerald Kief, MD  benzonatate (TESSALON) 200 MG capsule Take 1 capsule (200 mg total) by mouth 3 (three) times daily as needed for cough. Patient not taking: Reported on 03/08/2023 02/27/23 02/27/24  Cobb, Ruby Cola, NP  guaiFENesin-dextromethorphan (ROBITUSSIN DM) 100-10 MG/5ML syrup Take 5 mLs by mouth every 6 (six) hours as needed for cough. Patient not taking: Reported on 03/08/2023 02/27/23   Noemi Chapel, NP  hydrOXYzine (ATARAX) 25 MG tablet Take 1 tablet (25 mg total) by mouth 3 (three) times daily as needed for anxiety or itching. Patient not taking: Reported on 02/27/2023 01/18/23   Jerald Kief, MD  predniSONE (DELTASONE) 10 MG tablet 4 tabs for 2 days, then 3 tabs for 2 days, 2 tabs for 2 days, then 1 tab for 2 days, then stop Patient not taking: Reported on 03/08/2023 02/27/23   Noemi Chapel, NP  dicyclomine (BENTYL) 20 MG tablet Take 1 tablet (20 mg total) by mouth every 12 (twelve) hours as needed for spasms (abdominal pain/cramping). 08/28/18 03/08/20  Antony Madura, PA-C  promethazine (PHENERGAN) 25 MG tablet Take 1 tablet (25 mg total) by mouth every 6 (six) hours as needed for nausea or  vomiting. 08/28/18 03/08/20  Antony Madura, PA-C  sucralfate (CARAFATE) 1 GM/10ML suspension Take 10 mLs (1 g total) by mouth 4 (four) times daily -  with meals and at bedtime. 08/07/18 03/08/20  Howard Pouch, MD      Allergies    Duragesic-100 [fentanyl] and Hydrocodone    Review of Systems   Review of Systems  All other systems reviewed and are negative.   Physical Exam Updated Vital Signs BP 118/86   Pulse 82   Temp 98 F (36.7 C) (Oral)    Resp 16   SpO2 98%  Physical Exam Vitals and nursing note reviewed.  Constitutional:      General: She is not in acute distress.    Appearance: Normal appearance.  Eyes:     General: No scleral icterus.    Extraocular Movements: Extraocular movements intact.  Cardiovascular:     Rate and Rhythm: Normal rate.  Pulmonary:     Effort: Pulmonary effort is normal.     Breath sounds: Decreased breath sounds present.     Comments: Diminished breath sounds noted throughout lung fields with mild wheezing noted. Increased work of breathing noted on exam.  Abdominal:     Palpations: Abdomen is soft. There is no mass.     Tenderness: There is no abdominal tenderness.  Musculoskeletal:        General: Normal range of motion.     Cervical back: Neck supple.  Skin:    General: Skin is warm and dry.     Findings: No rash.  Neurological:     Mental Status: She is alert.     Sensory: Sensation is intact.     Motor: Motor function is intact.  Psychiatric:        Behavior: Behavior normal.     ED Results / Procedures / Treatments   Labs (all labs ordered are listed, but only abnormal results are displayed) Labs Reviewed  BASIC METABOLIC PANEL - Abnormal; Notable for the following components:      Result Value   Glucose, Bld 170 (*)    All other components within normal limits  CBC WITH DIFFERENTIAL/PLATELET - Abnormal; Notable for the following components:   HCT 47.1 (*)    All other components within normal limits  LACTIC ACID, PLASMA - Abnormal; Notable for the following components:   Lactic Acid, Venous 3.4 (*)    All other components within normal limits  CULTURE, BLOOD (ROUTINE X 2)  CULTURE, BLOOD (ROUTINE X 2)  URINALYSIS, ROUTINE W REFLEX MICROSCOPIC  LACTIC ACID, PLASMA  BRAIN NATRIURETIC PEPTIDE  RAPID URINE DRUG SCREEN, HOSP PERFORMED  CBG MONITORING, ED  TROPONIN I (HIGH SENSITIVITY)  TROPONIN I (HIGH SENSITIVITY)    EKG EKG Interpretation  Date/Time:  Friday  March 08 2023 11:00:07 EDT Ventricular Rate:  60 PR Interval:  131 QRS Duration: 88 QT Interval:  418 QTC Calculation: 418 R Axis:   84 Text Interpretation: Sinus rhythm Atrial premature complex Consider left atrial enlargement Anterior infarct, old no significant change since Mar 2024 Confirmed by Pricilla Loveless [phone]) on 03/08/2023 11:40:23 AM  Radiology DG Chest 2 View  Result Date: 03/08/2023 CLINICAL DATA:  Shortness of breath.  History of COPD and asthma EXAM: CHEST - 2 VIEW COMPARISON:  CT chest dated March 01, 2023 FINDINGS: The heart size and mediastinal contours are within normal limits. Hyperinflated lungs suggesting emphysema. Lungs are otherwise clear without evidence of focal consolidation or pleural effusion. Thoracic spondylosis. No acute osseous abnormality. IMPRESSION:  No active cardiopulmonary disease. Emphysema. Electronically Signed   By: Larose Hires D.O.   On: 03/08/2023 11:27    Procedures Procedures    Medications Ordered in ED Medications  sodium chloride 0.9 % bolus 1,000 mL (1,000 mLs Intravenous New Bag/Given 03/08/23 1318)    ED Course/ Medical Decision Making/ A&P Clinical Course as of 03/08/23 1556  Fri Mar 08, 2023  1152 Re-evaluated and noted with mild improvement of her lung sounds  [SB]  1154 Pt oxygen at 84-85% on RA, patient placed on 4 L via [place] with oxygen improving to 90-92% [SB]  1301 Lactic Acid, Venous(!!): 3.4 [SB]  1400 B Natriuretic Peptide(!): 166.6 [SB]  1400 Discussed with patient lab and imaging findings.  Discussed with patient plans for admission.  Answered all verbal questions.  Patient appears safe for admission at this time. [SB]  1514 Consult with IMTS, Dr. Ned Card who will come and evaluate the patient for admission. [SB]    Clinical Course User Index [SB] Jillian Pianka A, PA-C                             Medical Decision Making Amount and/or Complexity of Data Reviewed Labs: ordered. Decision-making details documented in  ED Course. Radiology: ordered.  Risk Decision regarding hospitalization.   Patient presents to the ED complaining of shortness of breath onset 2 weeks.  Vital signs pt afebrile.  Pt doesn't wear oxygen at baseline.  On exam patient with Diminished breath sounds noted throughout lung fields with mild wheezing noted. Increased work of breathing noted on exam. No acute cardiovascular, respiratory, abdominal exam findings.  No pitting edema noted bilaterally. No recent echo. Differential diagnosis includes COPD exacerbation, CHF, ACS, PTX, PNA.   Co morbidities that complicate the patient evaluation: COPD   Labs:  I ordered, and personally interpreted labs.  The pertinent results include:   Initial troponin at 7 delta troponin ordered with results pending at time of admission Blood cultures ordered with results pending at time of sign out Initial lactic at 3.4, repeat lactic at 1.3 (IVF initiated) Urinalysis unremarkable UDS noted positive for cocaine CBG ordered with results pending at time of admission BNP slightly elevated at 166.6 BMP with elevated glucose at 170, otherwise unremarkable CBC unremarkable   Imaging: I ordered imaging studies including CXR I independently visualized and interpreted imaging which showed:  No active cardiopulmonary disease. Emphysema.   I agree with the radiologist interpretation  Medications:  I ordered medication including IVF  I have reviewed the patients home medicines and have made adjustments as needed   Consultations: I requested consultation with the Hospitalist, Dr. Ned Card and discussed lab and imaging findings as well as pertinent plan - they recommend: will evaluate for admission  Disposition: Presentation suspicious for COPD exacerbation and respiratory failure with hypoxia. Doubt concerns at this time for PNA, PTX, CHF. After consideration of the diagnostic results and the patients response to treatment, I feel that the patient would  benefit from Admission to the hospital. Discussed with patient and significant other plans for admission. Pt agreeable at this time. Pt appears safe for admission.   This chart was dictated using voice recognition software, Dragon. Despite the best efforts of this provider to proofread and correct errors, errors may still occur which can change documentation meaning.  Final Clinical Impression(s) / ED Diagnoses Final diagnoses:  Acute respiratory failure with hypoxia (HCC)  COPD exacerbation (HCC)    Rx /  DC Orders ED Discharge Orders     None         Fanchon Papania A, PA-C 03/08/23 1556    Pricilla Loveless, MD 03/09/23 1345

## 2023-03-08 NOTE — ED Triage Notes (Signed)
Pt bib ems from home; ems called out by fiance;hx copd; pt apneic and cyanotic on ems arrival, ventilations assisted and neb treatment given; alert now, gcs 15; 2g mag; 125 solu med given pta; 2nd duo neb on arrival to ED; pt alert and oriented on arrival to ED; HR 76, 98-100% on neb; 143/98; 20 ga lac ; c/o L sided cp, endorses some continues sob; denies N/V; recently treated for pneumonia

## 2023-03-08 NOTE — ED Notes (Signed)
Ice chips provided to the patient. Okayed by PA, Blue.

## 2023-03-08 NOTE — Progress Notes (Signed)
Pt arrived to 6 north room 2 alert and oriented x4. Patient ambulated from stretcher to bed with no issues. Pain level when arriving to unit is 4/10. Bed in lowest position, call light in reach. Will continue to monitor pt.

## 2023-03-08 NOTE — ED Notes (Signed)
Pt provided with a coke. Okayd by provider.

## 2023-03-08 NOTE — Hospital Course (Addendum)
Recent Discharge: -Discharged 3/8 after being hospitalized for community-acquired pneumonia/COPD exacerbation (discharged on prednisone taper for 11 days, azithromycin for 4 days, cefdinir 4 days)  ED findings: -Desaturating on RA,  -BMP WNL -CBC WNL -Pending blood cultures -Trops WNL -BNP elevated at 166 -Initial lactic acid 3.4 -Repeat lactic acid 1.3 -UA WNL -CXR negative for acute cardiopulmonary disease, emphysema -EKG demonstrated sinus rhythm with PAC  ED interventions:  -1L 0.9% NaCL bolus   PCP: Knox Royalty, MD  PMH:  COPD (no home O2), hypothyroidism, bipolar disorder, anxiety, chronic back pain, gerd, and tobacco abuse    (+) SX: chest pain, SOB -*When was LKN* The patient states that recently she gets up to walk to the bathroom and is too short of breath. She just got out of the hospital 3 weeks ago with pneumonia/COPD and really never got completely better. She also is unable to lay flat due to shortness of breath. This morning, the patient's lips turned blue / the patient panicked and they called EMS. Reportedly she lost consciousness and needed bagged by EMS?  She does not wear supplemental O2 at home.  The patient used her home albuterol nebulizer today but it did not help her breathing.   Has worse cough, more sputum production.   She is concerned about toxin exposure from her septic tank. Both she and her hsuband have lost weight and been fatigued since moving into her home 6 years ago.   Chest pain worse with coughing - feels sore   (-) SX: - No fevers, chills, dizzy, abd pain, n/v/d, constipation, melena, hematochezia, LE swelling, dysuria, hematuria,   - *fever, chills, cough, ST, nausea, vomiting, diarrhea, abdominal pain, constipation, chest pain, palpitations, SOB, orthopnea, lower extremity swelling, dysuria, urinary frequency, headache, lightheadedness, dizziness, syncope*  Surgical Hx: Thyroidectomy in 20s Hysterectomy   Social Hx:  -*Live  with husband -Independent of ADLs, iADLs -ambulates w/o walker/cane - ETOH hardly ever - smokes marijuana less than weekly - tobacco 1 pack/day - used to be 2 packs/day since teenager, now in the last year 1 pack/day - denies cocaine use  Family Hx: -mother died with COPD  -dad died of lung and stomach cancer -no DM, HTN, cardiovascular disease  Medications: *taking regularly, took today* -Albuterol inhaler -Breztri inhaler - ran out possibly? -Levothyroxine 125 mg daily  -Prednisone  Allergies: Hydrocodone/fentanyl  Code:  Physical Exam:  (+): (-):  Plan:   4/27 Still having productive cough SOB still present Anxiety is the same Able to use the bathroom without significant dyspnea  Chest pain still present while coughing

## 2023-03-08 NOTE — Telephone Encounter (Signed)
I called and spoke with the pt's daughter ok per DPR and notified of response. She verbalized understanding.

## 2023-03-08 NOTE — Plan of Care (Signed)
  Problem: Education: Goal: Knowledge of General Education information will improve Description: Including pain rating scale, medication(s)/side effects and non-pharmacologic comfort measures Outcome: Progressing   Problem: Health Behavior/Discharge Planning: Goal: Ability to manage health-related needs will improve Outcome: Progressing   Problem: Clinical Measurements: Goal: Ability to maintain clinical measurements within normal limits will improve Outcome: Progressing Goal: Cardiovascular complication will be avoided Outcome: Progressing   Problem: Nutrition: Goal: Adequate nutrition will be maintained Outcome: Progressing   

## 2023-03-09 DIAGNOSIS — E872 Acidosis, unspecified: Secondary | ICD-10-CM | POA: Diagnosis present

## 2023-03-09 DIAGNOSIS — J9601 Acute respiratory failure with hypoxia: Secondary | ICD-10-CM | POA: Diagnosis not present

## 2023-03-09 DIAGNOSIS — F1290 Cannabis use, unspecified, uncomplicated: Secondary | ICD-10-CM | POA: Diagnosis present

## 2023-03-09 DIAGNOSIS — Z79899 Other long term (current) drug therapy: Secondary | ICD-10-CM | POA: Diagnosis not present

## 2023-03-09 DIAGNOSIS — D72829 Elevated white blood cell count, unspecified: Secondary | ICD-10-CM | POA: Diagnosis not present

## 2023-03-09 DIAGNOSIS — Z885 Allergy status to narcotic agent status: Secondary | ICD-10-CM | POA: Diagnosis not present

## 2023-03-09 DIAGNOSIS — Z716 Tobacco abuse counseling: Secondary | ICD-10-CM | POA: Diagnosis not present

## 2023-03-09 DIAGNOSIS — K219 Gastro-esophageal reflux disease without esophagitis: Secondary | ICD-10-CM | POA: Diagnosis present

## 2023-03-09 DIAGNOSIS — J441 Chronic obstructive pulmonary disease with (acute) exacerbation: Secondary | ICD-10-CM | POA: Diagnosis present

## 2023-03-09 DIAGNOSIS — F17210 Nicotine dependence, cigarettes, uncomplicated: Secondary | ICD-10-CM | POA: Diagnosis present

## 2023-03-09 DIAGNOSIS — G894 Chronic pain syndrome: Secondary | ICD-10-CM | POA: Diagnosis present

## 2023-03-09 DIAGNOSIS — J439 Emphysema, unspecified: Secondary | ICD-10-CM | POA: Diagnosis present

## 2023-03-09 DIAGNOSIS — Z7952 Long term (current) use of systemic steroids: Secondary | ICD-10-CM | POA: Diagnosis not present

## 2023-03-09 DIAGNOSIS — Z825 Family history of asthma and other chronic lower respiratory diseases: Secondary | ICD-10-CM | POA: Diagnosis not present

## 2023-03-09 DIAGNOSIS — Z7982 Long term (current) use of aspirin: Secondary | ICD-10-CM | POA: Diagnosis not present

## 2023-03-09 DIAGNOSIS — T380X5A Adverse effect of glucocorticoids and synthetic analogues, initial encounter: Secondary | ICD-10-CM | POA: Diagnosis not present

## 2023-03-09 DIAGNOSIS — Z7951 Long term (current) use of inhaled steroids: Secondary | ICD-10-CM | POA: Diagnosis not present

## 2023-03-09 DIAGNOSIS — F319 Bipolar disorder, unspecified: Secondary | ICD-10-CM | POA: Diagnosis present

## 2023-03-09 DIAGNOSIS — F411 Generalized anxiety disorder: Secondary | ICD-10-CM | POA: Diagnosis present

## 2023-03-09 DIAGNOSIS — E890 Postprocedural hypothyroidism: Secondary | ICD-10-CM | POA: Diagnosis present

## 2023-03-09 DIAGNOSIS — Z79890 Hormone replacement therapy (postmenopausal): Secondary | ICD-10-CM | POA: Diagnosis not present

## 2023-03-09 LAB — CBC
HCT: 43.4 % (ref 36.0–46.0)
Hemoglobin: 14.3 g/dL (ref 12.0–15.0)
MCH: 29.4 pg (ref 26.0–34.0)
MCHC: 32.9 g/dL (ref 30.0–36.0)
MCV: 89.3 fL (ref 80.0–100.0)
Platelets: 207 10*3/uL (ref 150–400)
RBC: 4.86 MIL/uL (ref 3.87–5.11)
RDW: 12.5 % (ref 11.5–15.5)
WBC: 13.1 10*3/uL — ABNORMAL HIGH (ref 4.0–10.5)
nRBC: 0 % (ref 0.0–0.2)

## 2023-03-09 LAB — CULTURE, BLOOD (ROUTINE X 2)

## 2023-03-09 LAB — BASIC METABOLIC PANEL
Anion gap: 6 (ref 5–15)
BUN: 7 mg/dL (ref 6–20)
CO2: 25 mmol/L (ref 22–32)
Calcium: 8.9 mg/dL (ref 8.9–10.3)
Chloride: 107 mmol/L (ref 98–111)
Creatinine, Ser: 0.62 mg/dL (ref 0.44–1.00)
GFR, Estimated: >60 mL/min (ref >60)
Glucose, Bld: 110 mg/dL — ABNORMAL HIGH (ref 70–99)
Potassium: 3.6 mmol/L (ref 3.5–5.1)
Sodium: 138 mmol/L (ref 135–145)

## 2023-03-09 MED ORDER — SERTRALINE HCL 50 MG PO TABS
25.0000 mg | ORAL_TABLET | Freq: Every day | ORAL | Status: DC
  Administered 2023-03-09 – 2023-03-10 (×2): 25 mg via ORAL
  Filled 2023-03-09 (×2): qty 1

## 2023-03-09 MED ORDER — NICOTINE 7 MG/24HR TD PT24
7.0000 mg | MEDICATED_PATCH | Freq: Every day | TRANSDERMAL | Status: DC
  Filled 2023-03-09 (×2): qty 1

## 2023-03-09 MED ORDER — LORAZEPAM 0.5 MG PO TABS
0.5000 mg | ORAL_TABLET | Freq: Four times a day (QID) | ORAL | Status: DC | PRN
  Administered 2023-03-09 – 2023-03-10 (×4): 0.5 mg via ORAL
  Filled 2023-03-09 (×4): qty 1

## 2023-03-09 NOTE — Progress Notes (Addendum)
   Subjective:  No significant overnight events. She reports feeling about the same as yesterday. Her cough has somewhat improved but still having increased sputum production. She reports continued chest pain only with coughing. Also feeling anxious again this morning. Able to walk to restroom unassisted throughout the night.  Objective:  Vital signs in last 24 hours: Vitals:   03/08/23 2356 03/09/23 0404 03/09/23 0852 03/09/23 0908  BP: 117/71 106/75  132/88  Pulse: 60 60  62  Resp: 17 17  18   Temp: (!) 97.5 F (36.4 C) 98.1 F (36.7 C)  97.8 F (36.6 C)  TempSrc: Oral Oral  Oral  SpO2: 95% 95% 90% 96%   Weight change:   Intake/Output Summary (Last 24 hours) at 03/09/2023 1126 Last data filed at 03/08/2023 2300 Gross per 24 hour  Intake 1480 ml  Output --  Net 1480 ml   Physical Exam: General: Chronically ill-appearing female lying in bed. In no acute distress but anxious. CV: RRR. No murmurs, rubs, or gallops. Pulmonary: Increased work of breathing on 2L nasal cannula. Diminished air movement in both lungs with inspiratory wheezing bilaterally. Neurological: Alert and oriented to person, place, and time. Skin: Warm and dry.   Assessment/Plan:  Principal Problem:   COPD with acute exacerbation (HCC) Active Problems:   Anxiety state   Tobacco use disorder   Chronic pain syndrome  Kooper Horn is a 52 y.o. person living with COPD, Bipolar 1 disorder, anxiety, hypothyroidism, and tobacco and marijuana use that presented with respiratory distress and was admitted for COPD exacerbation.    # COPD exacerbation # Hospitalization for CAP (01/16/2023) Cough, dyspnea, and sputum production largely unchanged from yesterday. Saturating at 91% on 2L nasal cannula with increased work of breathing. Will continue bronchodilators and prednisone 40 mg today. Since CXR showed no signs of pneumonia and patient without fevers/chills, will discontinue ceftriaxone. Mild leukocytosis likely  secondary to steroids. Will continue azithromycin due to additional antiinflammatory benefits. FEV1/FVC ratio of 49-52% in 2019 demonstrating advanced COPD. May need home oxygen. Although patient has risk factors for COPD like significant tobacco use history, mother also died young from COPD. Patient could benefit from alpha-1-antitrypsin screening as outpatient as well as repeated PFTs. - Azithromycin 500 mg daily (day 2 of 3) - Discontinue ceftriaxone - Breo Ellipta 1 puff daily - Incruse Ellipta 1 puff daily - Duo-Neb Q6 while awake - Prednisone 40 mg daily (day 1 of 4) - Goal O2 88-92%  # Bipolar 1 disorder # Anxiety Patient reports anxiety was previously well-managed taking Zoloft and Klonopin. Since she is still feeling anxious today, we discontinued hydroxyzine and started Zoloft and Ativan. Will need outpatient follow-up for these conditions. - Ativan 0.5 mg Q6 PRN - Zoloft 25 mg daily  # Tobacco use # Marijuana use Discussed tobacco cessation today. Patient agreeable to trying nicotine patch to help with comfort. Patient previously tried Varenicline but reports not feeling well with this medication. - Nicoderm 7 mg/24 hour patch  # Chronic back pain Patient reports back pain this morning after prolonged period in bed. Encouraged patient to ambulate and OOB. Will give PRN tylenol and continue to reassess symptoms.  - Tylenol 650 mg Q6 PRN  Diet: Regular Bowel: Miralax VTE: Xarelto IVF: None Code: Full LOS: day 1   Prior to Admission Living Arrangement: home with husband Anticipated Discharge Location: TBD Barriers to Discharge: continued management  Whitman Hero, Medical Student 03/09/2023, 11:26 AM

## 2023-03-10 DIAGNOSIS — J441 Chronic obstructive pulmonary disease with (acute) exacerbation: Secondary | ICD-10-CM | POA: Diagnosis not present

## 2023-03-10 DIAGNOSIS — F17210 Nicotine dependence, cigarettes, uncomplicated: Secondary | ICD-10-CM | POA: Diagnosis not present

## 2023-03-10 LAB — CBC
HCT: 45.4 % (ref 36.0–46.0)
Hemoglobin: 15 g/dL (ref 12.0–15.0)
MCH: 29.4 pg (ref 26.0–34.0)
MCHC: 33 g/dL (ref 30.0–36.0)
MCV: 88.8 fL (ref 80.0–100.0)
Platelets: 236 10*3/uL (ref 150–400)
RBC: 5.11 MIL/uL (ref 3.87–5.11)
RDW: 12.9 % (ref 11.5–15.5)
WBC: 9.1 10*3/uL (ref 4.0–10.5)
nRBC: 0 % (ref 0.0–0.2)

## 2023-03-10 MED ORDER — PREDNISONE 20 MG PO TABS: 40.0000 mg | ORAL_TABLET | Freq: Every day | ORAL | 0 refills | Status: AC

## 2023-03-10 MED ORDER — ALBUTEROL SULFATE HFA 108 (90 BASE) MCG/ACT IN AERS: 2.0000 | INHALATION_SPRAY | Freq: Four times a day (QID) | RESPIRATORY_TRACT | 0 refills | Status: DC | PRN

## 2023-03-10 MED ORDER — IPRATROPIUM-ALBUTEROL 0.5-2.5 (3) MG/3ML IN SOLN: 3.0000 mL | Freq: Two times a day (BID) | RESPIRATORY_TRACT | Status: DC

## 2023-03-10 MED ORDER — ALBUTEROL SULFATE (2.5 MG/3ML) 0.083% IN NEBU
5.0000 mg | INHALATION_SOLUTION | RESPIRATORY_TRACT | 0 refills | Status: DC | PRN
Start: 2023-03-10 — End: 2023-03-24

## 2023-03-10 MED ORDER — VARENICLINE TARTRATE (STARTER) 0.5 MG X 11 & 1 MG X 42 PO TBPK: 1.0000 | ORAL_TABLET | ORAL | 0 refills | Status: DC

## 2023-03-10 MED ORDER — BREZTRI AEROSPHERE 160-9-4.8 MCG/ACT IN AERO: 2.0000 | INHALATION_SPRAY | Freq: Two times a day (BID) | RESPIRATORY_TRACT | 1 refills | Status: DC

## 2023-03-10 MED ORDER — SERTRALINE HCL 25 MG PO TABS: 25.0000 mg | ORAL_TABLET | Freq: Every day | ORAL | 0 refills | Status: DC

## 2023-03-10 MED ORDER — LORAZEPAM 0.5 MG PO TABS: 0.5000 mg | ORAL_TABLET | Freq: Two times a day (BID) | ORAL | 0 refills | Status: AC | PRN

## 2023-03-10 MED ORDER — AZITHROMYCIN 250 MG PO TABS
500.0000 mg | ORAL_TABLET | Freq: Once | ORAL | Status: AC
  Administered 2023-03-10: 500 mg via ORAL
  Filled 2023-03-10: qty 2

## 2023-03-10 NOTE — Progress Notes (Addendum)
SATURATION QUALIFICATIONS: (This note is used to comply with regulatory documentation for home oxygen)  Patient Saturations on Room Air at Rest = 91%  Patient Saturations on Room Air while Ambulating = 86%  Patient Saturations on 2 Liters of oxygen while Ambulating = 95%  Please briefly explain why patient needs home oxygen:     I agree with the above findings, as documented by [employer], and recommend that the patient be discharged with supplemental O2.   Elza Rafter, DO Internal Medicine Resident, PGY-2

## 2023-03-10 NOTE — Discharge Summary (Signed)
Name: [name] MRN: [id number] DOB: [birth year] 52 y.o. PCP: Knox Royalty, MD  Date of Admission: 03/08/2023 10:44 AM Date of Discharge:  03/10/2023 Attending Physician: Dr. Oswaldo Done  DISCHARGE DIAGNOSIS:  Primary Problem: COPD with acute exacerbation [facility])   Hospital Problems: Principal Problem:   COPD with acute exacerbation (HCC) Active Problems:   Anxiety state   Tobacco use disorder   Chronic pain syndrome   COPD exacerbation (HCC)    DISCHARGE MEDICATIONS:   Allergies as of 03/10/2023       Reactions   Duragesic-100 [fentanyl] Nausea And Vomiting   Hydrocodone Nausea And Vomiting        Medication List     STOP taking these medications    ASPIRIN EC PO   azithromycin 250 MG tablet Commonly known as: Zithromax   benzonatate 200 MG capsule Commonly known as: TESSALON   hydrOXYzine 25 MG tablet Commonly known as: ATARAX   PRESCRIPTION MEDICATION       TAKE these medications    albuterol 108 (90 Base) MCG/ACT inhaler Commonly known as: VENTOLIN HFA Inhale 2 puffs into the lungs every 6 (six) hours as needed for wheezing or shortness of breath.   albuterol (2.5 MG/3ML) 0.083% nebulizer solution Commonly known as: PROVENTIL Take 6 mLs (5 mg total) by nebulization every 4 (four) hours as needed for shortness of breath or wheezing.   Breztri Aerosphere 160-9-4.8 MCG/ACT Aero Generic drug: Budeson-Glycopyrrol-Formoterol Inhale 2 puffs into the lungs in the morning and at bedtime.   guaiFENesin-dextromethorphan 100-10 MG/5ML syrup Commonly known as: ROBITUSSIN DM Take 5 mLs by mouth every 6 (six) hours as needed for cough.   levothyroxine 125 MCG tablet Commonly known as: SYNTHROID Take 1 tablet (125 mcg total) by mouth daily.   LORazepam 0.5 MG tablet Commonly known as: ATIVAN Take 1 tablet (0.5 mg total) by mouth 2 (two) times daily as needed for up to 5 days for anxiety.   predniSONE 20 MG tablet Commonly known as: DELTASONE Take 2  tablets (40 mg total) by mouth daily with breakfast for 3 days. Start taking on: March 11, 2023 What changed:  medication strength how much to take how to take this when to take this additional instructions   sertraline 25 MG tablet Commonly known as: ZOLOFT Take 1 tablet (25 mg total) by mouth daily. Start taking on: March 11, 2023   Varenicline Tartrate (Starter) 0.5 MG X 11 & 1 MG X 42 Tbpk Commonly known as: Chantix Starting [employer] Take 1 packet by mouth as directed.               Durable Medical Equipment  (From admission, onward)           Start     Ordered   03/10/23 1140  For home use only DME oxygen  Once       Question Answer Comment  Length of Need Lifetime   Liters per Minute 2   Frequency Continuous (stationary and portable oxygen unit needed)   Oxygen conserving device Yes   Oxygen delivery system Gas      03/10/23 1140   03/10/23 0945  For home use only DME Pulse oximeter  Once        03/10/23 0944            DISPOSITION AND FOLLOW-UP:  Ms.Jolonda R Skyles was discharged from [facility] in Stable condition. At the hospital follow up visit please address:  Severe COPD Given 3  additional days of prednisone Supplemental O2 to use on exertion- will need PCP to write new order to get portable device Consider alpha-1-antitrypsin testing given family history of mother passing away with COPD at young age and the severity of her illness Tobacco use disorder Discussed smoking cessation and prescribed Chantix  Anxiety Was on Klonopin many years and discontinued this, as her PCP retired. Gave PRN ativan, as she has severe anxiety causing her respiratory distress Consider increasing zoloft dose and prescribing ativan, if indicated  Follow-up Recommendations: Consults: Pulmonary medicine Labs:  Alpha 1 antitripysin level + phenotype Studies: Consider updated PFTs New Medications: Prednisone x 3 days, sertraline 25 mg daily, and  ativan 0.5 mg bid PRN   Follow-up Appointments:  Follow-up Information     [facility] Follow up.   Contact information: [address] [place] [address] [phone]        [name], MD Follow up.   Specialty: Pulmonary Disease Contact information: [address] [place] [address] [phone]                 HOSPITAL COURSE:  Patient Summary: #COPD exacerbation #Hospitalization for CAP (01/16/2023) Tequila R Mairena is a 52 yo person with tobacco use disorder, anxiety, and multiple negative social determinants of health admitted to our service for acute on chronic obstructive pulmonary disease. She has pretty severe lung disease, last spirometry in 2019 showed a ratio of 49% and an FEV1 of 1.1 L. She has continued to use tobacco since that time. There has been inconsistent access to inhaled bronchodilators. On initial exam, she required 4L [place], however, with prednisone and azithromycin, and scheduled breathing treatments,  she was able to be weaned off of supplemental O2. She saturated well on room air, but did require supplemental O2 with ambulation, thus, will be discharged with this. She will also be discharged with an additional 3 days of prednisone, to complete a 5 day course, and her home PRN albuterol nebulizer and inhaler and scheduled Breztri.   #Hx of Bipolar disorder #GAD The patient was on zoloft and klonopin for many years, but has been off of them for >1 year since her old PCP retired. Patient was restarted on sertraline and given low dose ativan for anxiety. Discharged with zoloft, which I suspect will need to be increased, and PRN ativan, which she may benefit from in the outpatient setting.   #Hypothyroidism Patient underwent thyroidectomy in 20s after diagnosis with Graves' disease. She has been stable taking Levothyroxine 125 mcg daily at home with normal TSH of 4.016 one month ago.   # Tobacco  use # Marijuana use Patient smokes 1 pack cigarettes/day and marijuana less than once weekly. Previous smoked up to 2 packs/day. Discharged with Chantix starter pack.   DISCHARGE INSTRUCTIONS:   Discharge Instructions     Call MD for:  difficulty breathing, headache or visual disturbances   Complete by: As directed    Call MD for:  temperature >100.4   Complete by: As directed    Diet general   Complete by: As directed    Discharge instructions   Complete by: As directed    Dear Ms. Tenika, Keeran  You were hospitalized because of a COPD flare/exacerbation. We treated you with breathing treatments and steroids, which helped you to feel better. I have refilled all of your inhalers and your nebulizers - these are at your [facility] pharmacy on [address].  I have also prescribed  some new medications for you:  1. Prednisone: Please take 40 mg (2 tablets) by mouth each morning with breakfast, for the next 3 days  2. Zoloft: This medicine is for your anxiety - you should take 25 mg everyday.   3. Ativan: This is an "as needed" medication for your anxiety. You can take 0.5 mg (1 tablet) twice a day, as needed, for anxiety.   Continue to use your albuterol inhaler/nebulizer every 4-6 hours, as needed, for shortness of breath. You should also use your Breztri inhaler 2 puffs twice a day everyday.   We will be seeing you in our clinic for follow up of all of your medical conditions. There, we will also help you to get a portable oxygen device. Our clinic should be calling you tomorrow to set up an appointment, but if you do not hear from them, you can call [phone] to set up an appointment.  If you have any questions or concerns, call our clinic at [phone] or after hours call [phone] and ask for the internal medicine resident on call.  Take care, Dr. Ned Card   Increase activity slowly   Complete by: As directed        SUBJECTIVE:  Briseyda R Brosious was seen and evaluated on the day  of discharge. She feels much improved and is eager to get home. She does have some dyspnea on exertion, but feels much better (and less anxious) with supplemental o2.   Discharge Vitals:   BP 102/76 (BP Location: Left Arm)   Pulse 63   Temp 97.8 F (36.6 C) (Oral)   Resp 18   SpO2 93%   OBJECTIVE:  General: Pleasant, chronically ill-appearing female laying in bed. No acute distress. CV: RRR. No murmurs. No LE edema Pulmonary: Diminished air movement with wheezing, but improved. Normal work of breathing.  Extremities: Normal ROM. Skin: Warm and dry.  Neuro: A&Ox3. No focal deficit. Psych: Anxious mood and affect    Pertinent Labs, Studies, and Procedures:     Latest Ref Rng & Units 03/10/2023    2:11 AM 03/09/2023    1:00 AM 03/08/2023   11:05 AM  CBC  WBC 4.0 - 10.5 K/uL 9.1  13.1  5.1   Hemoglobin 12.0 - 15.0 g/dL 57.8  46.9  62.9   Hematocrit 36.0 - 46.0 % 45.4  43.4  47.1   Platelets 150 - 400 K/uL 236  207  216        Latest Ref Rng & Units 03/09/2023    1:00 AM 03/08/2023   11:05 AM 03/01/2023    1:52 PM  CMP  Glucose 70 - 99 mg/dL 528  413  92   BUN 6 - 20 mg/dL 7  6  5    Creatinine 0.44 - 1.00 mg/dL 2.44  0.10  2.72   Sodium 135 - 145 mmol/L 138  136  136   Potassium 3.5 - 5.1 mmol/L 3.6  4.3  3.3   Chloride 98 - 111 mmol/L 107  102  99   CO2 22 - 32 mmol/L 25  25  31    Calcium 8.9 - 10.3 mg/dL 8.9  9.0  9.3     DG Chest 2 View  Result Date: 03/08/2023 CLINICAL DATA:  Shortness of breath.  History of COPD and asthma EXAM: CHEST - 2 VIEW COMPARISON:  CT chest dated March 01, 2023 FINDINGS: The heart size and mediastinal contours are within normal limits. Hyperinflated lungs suggesting emphysema. Lungs are otherwise clear  without evidence of focal consolidation or pleural effusion. Thoracic spondylosis. No acute osseous abnormality. IMPRESSION: No active cardiopulmonary disease. Emphysema. Electronically Signed   By: Larose Hires D.O.   On: 03/08/2023 11:27      Signed: Elza Rafter, D.O.  Internal Medicine Resident, PGY-2 [facility]  Pager: [phone] 12:18 PM, 03/10/2023

## 2023-03-10 NOTE — Progress Notes (Addendum)
RNCM received orders for DME pulse oximeter and home oxygen. RNCM contacted Ada with [employer], pulse oximeter and oxygen to be delivered to patient's room prior to discharge today.

## 2023-03-11 LAB — CULTURE, BLOOD (ROUTINE X 2)

## 2023-03-12 DIAGNOSIS — N3010 Interstitial cystitis (chronic) without hematuria: Secondary | ICD-10-CM | POA: Diagnosis not present

## 2023-03-13 ENCOUNTER — Encounter: Payer: Self-pay | Admitting: Nurse Practitioner

## 2023-03-13 ENCOUNTER — Ambulatory Visit: Payer: Medicaid Other | Admitting: Nurse Practitioner

## 2023-03-13 LAB — CULTURE, BLOOD (ROUTINE X 2)
Culture: NO GROWTH
Culture: NO GROWTH
Special Requests: ADEQUATE

## 2023-03-14 ENCOUNTER — Encounter: Payer: Self-pay | Admitting: Adult Health

## 2023-03-14 ENCOUNTER — Ambulatory Visit (INDEPENDENT_AMBULATORY_CARE_PROVIDER_SITE_OTHER): Payer: Medicaid Other | Admitting: Adult Health

## 2023-03-14 VITALS — BP 106/64 | HR 89 | Temp 98.2°F | Ht 60.0 in | Wt 116.6 lb

## 2023-03-14 DIAGNOSIS — F1911 Other psychoactive substance abuse, in remission: Secondary | ICD-10-CM

## 2023-03-14 DIAGNOSIS — R911 Solitary pulmonary nodule: Secondary | ICD-10-CM | POA: Diagnosis not present

## 2023-03-14 DIAGNOSIS — F17200 Nicotine dependence, unspecified, uncomplicated: Secondary | ICD-10-CM

## 2023-03-14 DIAGNOSIS — J441 Chronic obstructive pulmonary disease with (acute) exacerbation: Secondary | ICD-10-CM | POA: Diagnosis not present

## 2023-03-14 DIAGNOSIS — J9611 Chronic respiratory failure with hypoxia: Secondary | ICD-10-CM | POA: Insufficient documentation

## 2023-03-14 NOTE — Assessment & Plan Note (Signed)
Discussed cessation 

## 2023-03-14 NOTE — Patient Instructions (Addendum)
Continue on Breztri 2 puffs twice daily, rinse after use Mucinex DM Twice daily  As needed  cough/congestion .  Albuterol inhaler as needed Work on not smoking Labs today  CT chest without contrast in 6 months. Continue on Oxygen 2l/m . Goal is to have oxygen level >88-90%.  Follow up in 6 weeks with Dr. Sherene Brady  with PFT  Please contact office for sooner follow up if symptoms do not improve or worsen or seek emergency care

## 2023-03-14 NOTE — Assessment & Plan Note (Signed)
>>  ASSESSMENT AND PLAN FOR COPD WITH ACUTE EXACERBATION (HCC) WRITTEN ON 03/14/2023  3:32 PM BY PARRETT, TAMMY S, NP  Recent COPD exacerbation with hospitalization.  Patient is clinically improving.  Continue on triple therapy maintenance regimen.  Encouraged on smoking and cocaine/THC cessation.  Check alpha-1 test today.  Plan  Patient Instructions  Continue on Breztri 2 puffs twice daily, rinse after use Mucinex DM Twice daily  As needed  cough/congestion .  Albuterol inhaler as needed Work on not smoking Labs today  CT chest without contrast in 6 months. Continue on Oxygen 2l/m . Goal is to have oxygen level >88-90%.  Follow up in 6 weeks with Dr. Sherene Sires  with PFT  Please contact office for sooner follow up if symptoms do not improve or worsen or seek emergency care

## 2023-03-14 NOTE — Assessment & Plan Note (Signed)
Small scattered lung nodule noted on CT scan.  Patient is active smoker .  Set up for CT chest in 6 months.-If stable refer to the lung cancer CT screening program

## 2023-03-14 NOTE — Assessment & Plan Note (Signed)
Recent COPD exacerbation with hospitalization.  Patient is clinically improving.  Continue on triple therapy maintenance regimen.  Encouraged on smoking and cocaine/THC cessation.  Check alpha-1 test today.  Plan  Patient Instructions  Continue on Breztri 2 puffs twice daily, rinse after use Mucinex DM Twice daily  As needed  cough/congestion .  Albuterol inhaler as needed Work on not smoking Labs today  CT chest without contrast in 6 months. Continue on Oxygen 2l/m . Goal is to have oxygen level >88-90%.  Follow up in 6 weeks with Dr. Sherene Sires  with PFT  Please contact office for sooner follow up if symptoms do not improve or worsen or seek emergency care

## 2023-03-14 NOTE — Assessment & Plan Note (Signed)
Continue on oxygen at 2 L.  Evaluate oxygen needs on return visit
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
## 2023-03-23 LAB — ALPHA-1 ANTITRYPSIN PHENOTYPE: A-1 Antitrypsin, Ser: 175 mg/dL (ref 83–199)

## 2023-03-24 ENCOUNTER — Emergency Department (HOSPITAL_COMMUNITY)
Admission: EM | Admit: 2023-03-24 | Discharge: 2023-03-24 | Disposition: A | Discharge status: Home / Self Care | Payer: Medicaid Other | Attending: Emergency Medicine | Admitting: Emergency Medicine

## 2023-03-24 ENCOUNTER — Emergency Department (HOSPITAL_COMMUNITY): Payer: Medicaid Other

## 2023-03-24 DIAGNOSIS — Z7951 Long term (current) use of inhaled steroids: Secondary | ICD-10-CM | POA: Insufficient documentation

## 2023-03-24 DIAGNOSIS — Z7952 Long term (current) use of systemic steroids: Secondary | ICD-10-CM | POA: Insufficient documentation

## 2023-03-24 DIAGNOSIS — E039 Hypothyroidism, unspecified: Secondary | ICD-10-CM | POA: Diagnosis not present

## 2023-03-24 DIAGNOSIS — R062 Wheezing: Secondary | ICD-10-CM | POA: Diagnosis not present

## 2023-03-24 DIAGNOSIS — J441 Chronic obstructive pulmonary disease with (acute) exacerbation: Secondary | ICD-10-CM | POA: Diagnosis not present

## 2023-03-24 DIAGNOSIS — F17200 Nicotine dependence, unspecified, uncomplicated: Secondary | ICD-10-CM | POA: Diagnosis not present

## 2023-03-24 DIAGNOSIS — R0689 Other abnormalities of breathing: Secondary | ICD-10-CM | POA: Diagnosis not present

## 2023-03-24 DIAGNOSIS — R231 Pallor: Secondary | ICD-10-CM | POA: Diagnosis not present

## 2023-03-24 DIAGNOSIS — R0602 Shortness of breath: Secondary | ICD-10-CM | POA: Diagnosis not present

## 2023-03-24 DIAGNOSIS — F419 Anxiety disorder, unspecified: Secondary | ICD-10-CM

## 2023-03-24 DIAGNOSIS — Z79899 Other long term (current) drug therapy: Secondary | ICD-10-CM | POA: Insufficient documentation

## 2023-03-24 LAB — CBC WITH DIFFERENTIAL/PLATELET
Abs Immature Granulocytes: 0.03 10*3/uL (ref 0.00–0.07)
Basophils Absolute: 0.1 10*3/uL (ref 0.0–0.1)
Basophils Relative: 1 %
Eosinophils Absolute: 0.1 10*3/uL (ref 0.0–0.5)
Eosinophils Relative: 1 %
HCT: 46.6 % — ABNORMAL HIGH (ref 36.0–46.0)
Hemoglobin: 14.3 g/dL (ref 12.0–15.0)
Immature Granulocytes: 0 %
Lymphocytes Relative: 30 %
Lymphs Abs: 2.8 10*3/uL (ref 0.7–4.0)
MCH: 28.5 pg (ref 26.0–34.0)
MCHC: 30.7 g/dL (ref 30.0–36.0)
MCV: 92.8 fL (ref 80.0–100.0)
Monocytes Absolute: 0.4 10*3/uL (ref 0.1–1.0)
Monocytes Relative: 5 %
Neutro Abs: 5.9 10*3/uL (ref 1.7–7.7)
Neutrophils Relative %: 63 %
Platelets: 280 10*3/uL (ref 150–400)
RBC: 5.02 MIL/uL (ref 3.87–5.11)
RDW: 12.6 % (ref 11.5–15.5)
WBC: 9.3 10*3/uL (ref 4.0–10.5)
nRBC: 0 % (ref 0.0–0.2)

## 2023-03-24 LAB — BASIC METABOLIC PANEL
Anion gap: 8 (ref 5–15)
BUN: 6 mg/dL (ref 6–20)
CO2: 28 mmol/L (ref 22–32)
Calcium: 8.9 mg/dL (ref 8.9–10.3)
Chloride: 104 mmol/L (ref 98–111)
Creatinine, Ser: 0.72 mg/dL (ref 0.44–1.00)
GFR, Estimated: >60 mL/min (ref >60)
Glucose, Bld: 96 mg/dL (ref 70–99)
Potassium: 3.5 mmol/L (ref 3.5–5.1)
Sodium: 140 mmol/L (ref 135–145)

## 2023-03-24 MED ORDER — ALBUTEROL SULFATE (2.5 MG/3ML) 0.083% IN NEBU
5.0000 mg | INHALATION_SOLUTION | RESPIRATORY_TRACT | 1 refills | Status: DC | PRN
Start: 2023-03-24 — End: 2023-09-19

## 2023-03-24 MED ORDER — LORAZEPAM 0.5 MG PO TABS: 0.5000 mg | ORAL_TABLET | Freq: Two times a day (BID) | ORAL | 0 refills | Status: DC | PRN

## 2023-03-24 MED ORDER — PREDNISONE 20 MG PO TABS: 40.0000 mg | ORAL_TABLET | Freq: Every day | ORAL | 0 refills | Status: DC

## 2023-03-24 NOTE — Discharge Instructions (Signed)
A prescription for your albuterol nebulizer, Ativan and prednisone were sent to the [facility] pharmacy.  You will need to meet up with your doctor on the 16th as planned to make sure that they can get you a new Breztri inhaler.  If your symptoms worsen return to the emergency room but everything looks good today.
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
## 2023-03-24 NOTE — ED Notes (Signed)
Ambulating SpO2 pt denied SOB, CP & dizziness Lowest SpO2 88% Recovered to baseline 92% quickly   03/24/23 1226 03/24/23 1228 03/24/23 1230  Vitals  Pulse Rate 67 77 69  ECG Heart Rate 68 77 70  Resp 18 (!) 23 17  MEWS COLOR  MEWS Score Color [name] Green  Oxygen Therapy  SpO2 94 % 90 % 90 %    03/24/23 1232 03/24/23 1237  Vitals  Pulse Rate 74 65  ECG Heart Rate 85 66  Resp (!) 24 17  MEWS COLOR  MEWS Score Color Yellow Green  Oxygen Therapy  SpO2 (!) 88 % 91 %

## 2023-03-24 NOTE — ED Notes (Addendum)
This RN reviewed discharge instructions with patient. She verbalized understanding and denied any further questions. PT well appearing upon discharge and reports tolerable pain. Pt wheeled to exit. Pt endorses ride home.  

## 2023-03-24 NOTE — ED Triage Notes (Signed)
Pt BIB EMS from home for SOB and increased work of breathing. EMS gave Duoneb, 2g Magnesium, 125mg  solumedrol , 10 albuterol,1mg  Atrovent. Pt did not tolerate CPAP- clausteropbia. Pt stated oxygen did not work in the middle of the night.   EMS VS 119/78 ET 40 RR 40>16 100% on neb   22 R AC

## 2023-03-25 ENCOUNTER — Telehealth: Payer: Self-pay | Admitting: Family Medicine

## 2023-03-25 NOTE — Telephone Encounter (Signed)
She also states we gave her a "Cough suppressant" in a pill form. Can we refill that for her?  [facility] on [address].  (States she can not even afford Robitussin. Maybe we can leave a sample cough syrup as well?)  If you need to call daughter Sampson Si @  [phone]   She is aware of PT concerns and needs. TY

## 2023-03-25 NOTE — Telephone Encounter (Signed)
PT would like [place] to leave a sample of Beriva.  States Medicaid giving her issues with this medication. That may be in a older tel encounter.

## 2023-03-26 MED ORDER — BREZTRI AEROSPHERE 160-9-4.8 MCG/ACT IN AERO: 2.0000 | INHALATION_SPRAY | Freq: Two times a day (BID) | RESPIRATORY_TRACT | 0 refills | Status: DC

## 2023-03-26 MED ORDER — BENZONATATE 200 MG PO CAPS: 200.0000 mg | ORAL_CAPSULE | Freq: Three times a day (TID) | ORAL | 1 refills | Status: DC | PRN

## 2023-03-26 NOTE — Telephone Encounter (Signed)
Per Tammy- ok to give breztri and mucinex samples and also refill tessalon 200  Samples provided and rx was called in

## 2023-03-26 NOTE — Telephone Encounter (Signed)
Patient is requesting refill of tessalon pearls. Tammy please advise if okay to refill?

## 2023-03-28 ENCOUNTER — Ambulatory Visit (INDEPENDENT_AMBULATORY_CARE_PROVIDER_SITE_OTHER): Payer: Medicaid Other

## 2023-03-28 VITALS — BP 123/77 | HR 82 | Temp 98.1°F | Wt 117.2 lb

## 2023-03-28 DIAGNOSIS — F17200 Nicotine dependence, unspecified, uncomplicated: Secondary | ICD-10-CM

## 2023-03-28 DIAGNOSIS — F17210 Nicotine dependence, cigarettes, uncomplicated: Secondary | ICD-10-CM

## 2023-03-28 DIAGNOSIS — F419 Anxiety disorder, unspecified: Secondary | ICD-10-CM

## 2023-03-28 DIAGNOSIS — F411 Generalized anxiety disorder: Secondary | ICD-10-CM

## 2023-03-28 DIAGNOSIS — Z0000 Encounter for general adult medical examination without abnormal findings: Secondary | ICD-10-CM | POA: Insufficient documentation

## 2023-03-28 DIAGNOSIS — J449 Chronic obstructive pulmonary disease, unspecified: Secondary | ICD-10-CM | POA: Diagnosis not present

## 2023-03-28 DIAGNOSIS — Z139 Encounter for screening, unspecified: Secondary | ICD-10-CM

## 2023-03-28 MED ORDER — PULSE OXIMETER MISC: 1.0000 | Freq: Every day | 0 refills | Status: DC

## 2023-03-28 MED ORDER — SERTRALINE HCL 25 MG PO TABS: 25.0000 mg | ORAL_TABLET | Freq: Every day | ORAL | 0 refills | Status: DC

## 2023-03-28 NOTE — Assessment & Plan Note (Signed)
Patient was discharged on 4/28 after being hospitalized for acute hypoxic respiratory failure 2/2 COPD exacerbation. Patient was discharged with prescription for albuterol inhaler/nebulizer, breztri inhaler (triple therapy), prednisone 40 mg daily, and tessalon perles with plan to f/u w/ [facility] and pulmonology. Also discharged on 2L O2 w/ ambulation. Patient reports compliance with these medications and has completed her course of steroids. Symptoms today have improved. Patient scheduled to see pulmonology on 7/1. O2 saturation while ambulating without oxygen was 91%. Will repeat at next visit and discontinue supplemental oxygen at that time if it remains normal.    Plan: - Continue albuterol inhaler/nebulizer and breztri inhaler (triple therapy) - Continue f/u w/ pulmonology

## 2023-03-28 NOTE — Assessment & Plan Note (Signed)
Referral ordered for colonoscopy and mammogram. Also referred to ophthalmology for prescription eye glasses.

## 2023-03-28 NOTE — Progress Notes (Signed)
SATURATION QUALIFICATIONS: (This note is used to comply with regulatory documentation for home oxygen)  Patient Saturations on Room Air at Rest = 93% on room air at rest  Patient Saturations on Room Air while Ambulating = 91%  Patient Saturations on 0 Liters of oxygen while Ambulating= not obtained as O2 sats did not drop below 91%  Please briefly explain why patient needs home oxygen: Pt arrived with O2 tank from home-but turned off on arrival to [facility] Pt requesting smaller O2 tank (concentrator)  Walk test done-pt did not drop below 91%

## 2023-03-28 NOTE — Assessment & Plan Note (Signed)
Current medications include Varenicline. Patient reports that she has been able to decrease her cigarette use from 2 PPD to 5 cigarettes per day without trying Varenicline yet.   Plan: - Continue Varenicline

## 2023-03-28 NOTE — Assessment & Plan Note (Signed)
Current medications include sertraline 25 MG daily and LORazepam 0.5 MG BID PRN. Patient reports improvement of her anxiety with these medications. PGQ9 is 5 today. GAD is 6 today.   Plan: - Continue sertraline 25 MG daily and LORazepam 0.5 MG BID PRN - Referral to [facility] behavioral health specialist

## 2023-03-28 NOTE — Patient Instructions (Addendum)
Thank you for coming to see [place] in clinic [name].  Plan:  - Please continue taking:     - Albuterol inhaler/nebulizer, breztri inhaler for your COPD  - You can try over the counter mucinex DM for your cough.   - You can try Varenicline to help decrease cravings for tobacco.   - We referred you to see the following doctors (you will be called to schedule an appointment):     - GI/breast center for mammogram to screen for breast cancer  - GI (stomach doctors) to have colonoscopy done to screen for colorectal cancer  - Ophthalmologist (eye doctor) for prescription glasses  - Health department information:  Address: [address], [place], [place] [address] Phone: [phone]  Return Precautions: If you develop worsening cough, mucus production, shortness of breath, lightheadedness, please call our clinic and visit the emergency department as these can be signs of COPD exacerbation.    It was very nice to see you, thank you for allowing [place] to be involved in your care. We look forward to seeing you next time. Please call our clinic at [phone] if you have any questions or concerns. The best time to call is Monday-Friday from 9am-4pm, but there is someone available 24/7. If after hours or the weekend, call the main hospital number at [phone] and ask for the Internal Medicine Resident On-Call. If you need medication refills, please notify your pharmacy one week in advance and they will send [place] a request.   Please make sure to arrive 15 minutes prior to your next appointment. If you arrive late, you may be asked to reschedule.
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
## 2023-04-02 ENCOUNTER — Other Ambulatory Visit: Payer: Self-pay

## 2023-04-02 NOTE — Telephone Encounter (Signed)
Sertraline #60 sent to [facility] on 5/16. Will forward request for ativan.

## 2023-04-02 NOTE — Progress Notes (Signed)
Internal Medicine Clinic Attending  Case discussed with Dr. Peterson Lombard  At the time of the visit.  We reviewed the resident's history and exam and pertinent patient test results.  I agree with the assessment, diagnosis, and plan of care documented in the resident's note. Does not appear to need continuous oxygen given appropriate ambulatory saturation on RA today. Goal 88-92%.

## 2023-04-02 NOTE — Telephone Encounter (Signed)
Pt is requesting her    sertraline (ZOLOFT) 25 MG tablet     LORazepam (ATIVAN) 0.5 MG tablet    Going to   [id number] - [place], [place] - [address]

## 2023-04-03 MED ORDER — LORAZEPAM 0.5 MG PO TABS: 0.5000 mg | ORAL_TABLET | Freq: Two times a day (BID) | ORAL | 0 refills | Status: DC | PRN

## 2023-04-04 DIAGNOSIS — N3010 Interstitial cystitis (chronic) without hematuria: Secondary | ICD-10-CM | POA: Diagnosis not present

## 2023-04-05 ENCOUNTER — Ambulatory Visit: Payer: Medicaid Other

## 2023-04-08 ENCOUNTER — Telehealth: Payer: Self-pay | Admitting: Primary Care

## 2023-04-08 MED ORDER — PREDNISONE 10 MG PO TABS: ORAL_TABLET | ORAL | 0 refills | Status: DC

## 2023-04-08 MED ORDER — PROMETHAZINE-DM 6.25-15 MG/5ML PO SYRP: 5.0000 mL | ORAL_SOLUTION | Freq: Four times a day (QID) | ORAL | 0 refills | Status: DC | PRN

## 2023-04-08 MED ORDER — AZITHROMYCIN 250 MG PO TABS: ORAL_TABLET | ORAL | 0 refills | Status: DC

## 2023-04-08 NOTE — Telephone Encounter (Signed)
Patient called on-call service after hours. Hx COPD with recurrent exacerbations. She developed cough and chest congestion 2-3 days ago. Associated allergy symptoms and back pain with cough-which is not new for her. She is using [employer] twice daily and her nebulizer twice a day. Denies fever, chills, shortness of breath or hemoptysis. Requesting zpack and prednisone along with something for cough. Advised she keep follow-up with Dr. Sherene Sires

## 2023-04-10 DIAGNOSIS — S12100A Unspecified displaced fracture of second cervical vertebra, initial encounter for closed fracture: Secondary | ICD-10-CM | POA: Diagnosis not present

## 2023-04-10 DIAGNOSIS — J45909 Unspecified asthma, uncomplicated: Secondary | ICD-10-CM | POA: Diagnosis not present

## 2023-04-10 DIAGNOSIS — J410 Simple chronic bronchitis: Secondary | ICD-10-CM | POA: Diagnosis not present

## 2023-04-12 ENCOUNTER — Telehealth: Payer: Self-pay | Admitting: Internal Medicine

## 2023-04-12 NOTE — Telephone Encounter (Signed)
Pt is requesting for a Z-pack sample

## 2023-04-15 ENCOUNTER — Other Ambulatory Visit: Payer: Self-pay

## 2023-04-15 MED ORDER — PREDNISONE 10 MG PO TABS: ORAL_TABLET | ORAL | 0 refills | Status: DC

## 2023-04-15 MED ORDER — AZITHROMYCIN 250 MG PO TABS: ORAL_TABLET | ORAL | 0 refills | Status: DC
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
## 2023-04-15 NOTE — Telephone Encounter (Signed)
Antibiotic and prednisone have been sent to pharmacy. Patient is aware. NFN

## 2023-04-15 NOTE — Telephone Encounter (Signed)
Ok to re-send?  °
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
## 2023-04-15 NOTE — Telephone Encounter (Signed)
Antibiotic and prednisone have been sent to pharmacy. Pt is aware. NFN
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
## 2023-04-29 ENCOUNTER — Other Ambulatory Visit: Payer: Self-pay

## 2023-04-29 MED ORDER — SERTRALINE HCL 25 MG PO TABS: 25.0000 mg | ORAL_TABLET | Freq: Every day | ORAL | 0 refills | Status: DC

## 2023-04-29 MED ORDER — LORAZEPAM 0.5 MG PO TABS: 0.5000 mg | ORAL_TABLET | Freq: Two times a day (BID) | ORAL | 0 refills | Status: DC | PRN

## 2023-04-29 NOTE — Telephone Encounter (Signed)
Patient called she is requesting rx for 30 days supply for both rx, patient will be going [place] of [place].

## 2023-04-30 ENCOUNTER — Ambulatory Visit (INDEPENDENT_AMBULATORY_CARE_PROVIDER_SITE_OTHER): Payer: Medicaid Other | Admitting: Licensed Clinical Social Worker

## 2023-04-30 DIAGNOSIS — F419 Anxiety disorder, unspecified: Secondary | ICD-10-CM

## 2023-04-30 NOTE — BH Specialist Note (Signed)
Patient no-showed today's appointment; appointment was for Telephone visit at 2:30pm  Behavioral Health Clinician [facility] from here on out)  attempted patient  via Telephone number [phone]    [facility] contacted patient from telephone number [phone]. [facility] left a  VM.   Patient will need to reschedule appointment by calling Internal medicine center [phone].  Christen Butter, MSW, LCSW-A She/Her Behavioral Health Clinician [facility]  Internal Medicine Center Direct Dial:[phone]  Fax [phone] Main Office Phone: [phone] [address] [place] [place], [place] [address] Website: [facility] Internal Medicine [facility]  [place], [place]  [place]

## 2023-05-10 DIAGNOSIS — R110 Nausea: Secondary | ICD-10-CM | POA: Diagnosis not present

## 2023-05-10 DIAGNOSIS — R102 Pelvic and perineal pain: Secondary | ICD-10-CM | POA: Diagnosis not present

## 2023-05-10 DIAGNOSIS — N3010 Interstitial cystitis (chronic) without hematuria: Secondary | ICD-10-CM | POA: Diagnosis not present

## 2023-05-11 DIAGNOSIS — S12100A Unspecified displaced fracture of second cervical vertebra, initial encounter for closed fracture: Secondary | ICD-10-CM | POA: Diagnosis not present

## 2023-05-11 DIAGNOSIS — J410 Simple chronic bronchitis: Secondary | ICD-10-CM | POA: Diagnosis not present

## 2023-05-11 DIAGNOSIS — J45909 Unspecified asthma, uncomplicated: Secondary | ICD-10-CM | POA: Diagnosis not present

## 2023-05-13 ENCOUNTER — Telehealth: Payer: Self-pay | Admitting: Adult Health

## 2023-05-13 ENCOUNTER — Encounter: Payer: Self-pay | Admitting: Internal Medicine

## 2023-05-13 NOTE — Telephone Encounter (Signed)
PT wonders if we have any Breztri samples. Please look into that for her as well.

## 2023-05-13 NOTE — Telephone Encounter (Signed)
PT feels like she still has pneumonia and would like another round of Antibx. Please call @ [phone]   Pharm: [facility] on [address]

## 2023-05-14 ENCOUNTER — Encounter: Payer: Medicaid Other | Admitting: Student

## 2023-05-14 NOTE — Telephone Encounter (Signed)
Called patient but she did not answer. Left message for her to call us back.  

## 2023-05-17 ENCOUNTER — Ambulatory Visit: Payer: Medicaid Other

## 2023-05-21 ENCOUNTER — Ambulatory Visit: Payer: Medicaid Other | Admitting: Licensed Clinical Social Worker

## 2023-05-21 DIAGNOSIS — F419 Anxiety disorder, unspecified: Secondary | ICD-10-CM

## 2023-05-21 NOTE — BH Specialist Note (Signed)
Patient no-showed today's appointment; appointment was for Telephone visit at 3:00 pm  Behavioral Health Clinician [facility] from here on out)  attempted patient twice via Telephone number [phone]   [facility] contacted patient from telephone number [phone]. [facility] left a  VM.   Patient will need to reschedule appointment by calling Internal medicine center [phone].  Christen Butter, MSW, LCSW-A She/Her Behavioral Health Clinician [facility]  Internal Medicine Center Direct Dial:[phone]  Fax [phone] Main Office Phone: [phone] [address] [place] [place], [place] [address] Website: [facility] Internal Medicine [facility]  [place], [place]  [place]

## 2023-05-22 ENCOUNTER — Encounter: Payer: Medicaid Other | Admitting: Student

## 2023-05-22 ENCOUNTER — Ambulatory Visit (INDEPENDENT_AMBULATORY_CARE_PROVIDER_SITE_OTHER): Payer: Medicaid Other | Admitting: Student

## 2023-05-22 DIAGNOSIS — J449 Chronic obstructive pulmonary disease, unspecified: Secondary | ICD-10-CM | POA: Diagnosis not present

## 2023-05-22 DIAGNOSIS — F17210 Nicotine dependence, cigarettes, uncomplicated: Secondary | ICD-10-CM | POA: Diagnosis not present

## 2023-05-22 DIAGNOSIS — J309 Allergic rhinitis, unspecified: Secondary | ICD-10-CM

## 2023-05-22 DIAGNOSIS — E039 Hypothyroidism, unspecified: Secondary | ICD-10-CM

## 2023-05-22 MED ORDER — LEVOTHYROXINE SODIUM 125 MCG PO TABS
125.0000 ug | ORAL_TABLET | Freq: Every day | ORAL | 3 refills | Status: AC
Start: 2023-05-22 — End: ?

## 2023-05-22 MED ORDER — FLUTICASONE PROPIONATE 50 MCG/ACT NA SUSP: 2.0000 | Freq: Every day | NASAL | 2 refills | Status: DC

## 2023-05-22 NOTE — Assessment & Plan Note (Addendum)
Patient endorses some nasal congestion; identifies this with seasonal changes in summer and spring. Has previously gotten relief with flonase.   Will send in flonase to pharmacy.
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
## 2023-05-22 NOTE — Assessment & Plan Note (Addendum)
Patient taking levothyroxine 125 mcg for hypothyroidism post thyroidectomy. She has tolerated this dose well and would like a refill for this medication, as she has been out for 2 weeks. Her past TSH has been normal, 4 months ago was around 4, so would like to repeat TSH at follow up 06/06/23 due to pt endorsing fatigue. Pt agrees.   Will send refill of levothyroxine 125 as TSH is stable.
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
## 2023-05-22 NOTE — Assessment & Plan Note (Addendum)
Pt endorses some cough and fatigue. Had recent ER visit for SOB with prior hospitalizations for COPD exacerbations. She was discharged from ED with short rx of albuterol, ativan, and prednisone. Patient was able to speak in full sentences during telehealth visit today with no frequent cough.   Encouraged pt to follow up with pulmonology appt pending 05/24/23

## 2023-05-23 NOTE — Progress Notes (Signed)
Internal Medicine Clinic Attending  Case discussed with Dr. Zheng  At the time of the visit.  We reviewed the resident's history and exam and pertinent patient test results.  I agree with the assessment, diagnosis, and plan of care documented in the resident's note.  

## 2023-05-24 ENCOUNTER — Other Ambulatory Visit (HOSPITAL_COMMUNITY): Payer: Self-pay

## 2023-05-24 ENCOUNTER — Ambulatory Visit: Payer: Medicaid Other | Admitting: Internal Medicine

## 2023-05-24 ENCOUNTER — Encounter: Payer: Self-pay | Admitting: Internal Medicine

## 2023-05-24 VITALS — BP 138/68 | HR 82 | Wt 120.0 lb

## 2023-05-24 DIAGNOSIS — F17210 Nicotine dependence, cigarettes, uncomplicated: Secondary | ICD-10-CM | POA: Diagnosis not present

## 2023-05-24 DIAGNOSIS — J9611 Chronic respiratory failure with hypoxia: Secondary | ICD-10-CM | POA: Diagnosis not present

## 2023-05-24 DIAGNOSIS — J449 Chronic obstructive pulmonary disease, unspecified: Secondary | ICD-10-CM

## 2023-05-24 MED ORDER — PREDNISONE 10 MG PO TABS: ORAL_TABLET | ORAL | 0 refills | Status: DC

## 2023-05-24 MED ORDER — PROMETHAZINE-DM 6.25-15 MG/5ML PO SYRP: 5.0000 mL | ORAL_SOLUTION | Freq: Four times a day (QID) | ORAL | 0 refills | Status: DC | PRN

## 2023-05-24 MED ORDER — AZITHROMYCIN 250 MG PO TABS: ORAL_TABLET | ORAL | 0 refills | Status: DC

## 2023-05-24 NOTE — Assessment & Plan Note (Signed)
4-5 min discussion re active cigarette smoking in addition to office E&M  Ask about tobacco use:   ongoing Advise quitting   I took an extended  opportunity with this patient to outline the consequences of continued cigarette use  in airway disorders based on all the data we have from the multiple national lung health studies (perfomed over decades at millions of dollars in cost)  indicating that smoking cessation, not choice of inhalers or pulmonary  physicians, is the most important aspect of her  care.   Assess willingness:  Not committed at this point Assist in quit attempt:  Per PCP when ready Arrange follow up:   Follow up per Primary Care planned      

## 2023-05-24 NOTE — Patient Instructions (Addendum)
For congested /cough  >>>  Mucinex  1200 mg every 12 hours and use the flutter valve as much as you can   For severe cough ok to add phenergan dm 2 tsp every 4-6  hours as needed  Zpak   Prednisone 10 mg take  4 each am x 2 days,   2 each am x 2 days,  1 each am x 2 days and stop   Plan A = Automatic = Always=    Breztri Take 2 puffs first thing in am and then another 2 puffs about 12 hours later.     Work on inhaler technique:  relax and gently blow all the way out then take a nice smooth full deep breath back in, triggering the inhaler at same time you start breathing in.  Hold breath in for at least  5 seconds if you can. Blow out breztri  thru nose. Rinse and gargle with water when done.  If mouth or throat bother you at all,  try brushing teeth/gums/tongue with arm and hammer toothpaste/ make a slurry and gargle and spit out.       Plan B = Backup (to supplement plan A, not to replace it) Only use your albuterol inhaler as a rescue medication to be used if you can't catch your breath by resting or doing a relaxed purse lip breathing pattern.  - The less you use it, the better it will work when you need it. - Ok to use the inhaler up to 2 puffs  every 4 hours if you must but call for appointment if use goes up over your usual need - Don't leave home without it !!  (think of it like the spare tire for your car)   Plan C = Crisis (instead of Plan B but only if Plan B stops working) - only use your albuterol nebulizer if you first try Plan B and it fails to help > ok to use the nebulizer up to every 4 hours but if start needing it regularly call for immediate appointment   Please schedule a follow up office visit in 4 weeks, sooner if needed to see Katie with all meds in hand
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
## 2023-05-24 NOTE — Assessment & Plan Note (Signed)
Active smoker -  Spirometry 09/18/2018  FEV1 1.1 (44%)  Ratio 49 with classic curvature p am advair/spiriva dpi's - 09/18/2018  After extensive coaching inhaler device,  effectiveness =    75% with smi so try change to stiolto with pred x 6 d as "plan D"  > not doing as of 10/19/2019  - 05/24/2023  After extensive coaching inhaler device,  effectiveness =    75% with hfa > continue breztri /flutter valve and approp saba   DDX of  difficult airways management almost all start with A and  include Adherence, Ace Inhibitors, Acid Reflux, Active Sinus Disease, Alpha 1 Antitripsin deficiency, Anxiety masquerading as Airways dz,  ABPA,  Allergy(esp in young), Aspiration (esp in elderly), Adverse effects of meds,  Active smoking or vaping, A bunch of PE's (a small clot burden can't cause this syndrome unless there is already severe underlying pulm or vascular dz with poor reserve) plus two Bs  = Bronchiectasis and Beta blocker use..and one C= CHF   Adherence is always the initial "prime suspect" and is a multilayered concern that requires a "trust but verify" approach in every patient - starting with knowing how to use medications, especially inhalers, correctly, keeping up with refills and understanding the fundamental difference between maintenance and prns vs those medications only taken for a very short course and then stopped and not refilled.  - see hfa teaching ABC format action plan per AVS - return with all meds in hand using a trust but verify approach to confirm accurate Medication  Reconciliation The principal here is that until we are certain that the  patients are doing what we've asked, it makes no sense to ask them to do more.   Active smoking > (see separate a/p)   ? Active sinusits/ rhinitis > zpak at her request but doubt this is an infection  ? Allergy > Prednisone 10 mg take  4 each am x 2 days,   2 each am x 2 days,  1 each am x 2 days and stop/ continue high dose ICS = Breztri for group E  copd symptoms and risk   ? Anxiety/depression > usually at the bottom of this list of usual suspects but should be much higher on this pt's based on H and P and note already on psychotropics and may interfere with adherence and also ability to quit smoking plus interpretation of response or lack thereof to symptom management which can be quite subjective.

## 2023-05-24 NOTE — Assessment & Plan Note (Signed)
05/24/2023 presently on 3lpm hs and prn daytime   Rec: Make sure you check your oxygen saturation  AT  your highest level of activity (not after you stop)   to be sure it stays over 90% and adjust  02 flow upward to maintain this level if needed but remember to turn it back to previous settings when you stop (to conserve your supply).       Each maintenance medication was reviewed in detail including emphasizing most importantly the difference between maintenance and prns and under what circumstances the prns are to be triggered using an action plan format where appropriate.  Total time for H and P, chart review, counseling, reviewing 02 device(s) and generating customized AVS unique to this office visit / same day charting   > 30 min

## 2023-05-27 ENCOUNTER — Other Ambulatory Visit: Payer: Self-pay

## 2023-05-27 ENCOUNTER — Encounter: Payer: Self-pay | Admitting: Internal Medicine

## 2023-05-28 ENCOUNTER — Ambulatory Visit: Payer: Medicaid Other | Admitting: Licensed Clinical Social Worker

## 2023-05-28 DIAGNOSIS — F419 Anxiety disorder, unspecified: Secondary | ICD-10-CM

## 2023-05-28 MED ORDER — SERTRALINE HCL 25 MG PO TABS: 25.0000 mg | ORAL_TABLET | Freq: Every day | ORAL | 0 refills | Status: DC

## 2023-05-28 MED ORDER — LORAZEPAM 0.5 MG PO TABS: 0.5000 mg | ORAL_TABLET | Freq: Two times a day (BID) | ORAL | 0 refills | Status: DC | PRN

## 2023-05-28 NOTE — BH Specialist Note (Signed)
Patient no-showed today's appointment; appointment was for Telephone visit at 10:00am  Behavioral Health Clinician [facility] from here on out)  attempted patient twice via Telephone number [phone]    [facility] contacted patient from telephone number [phone]. [facility] left a  VM.   Patient will need to reschedule appointment by calling Internal medicine center [phone].  Christen Butter, MSW, LCSW-A She/Her Behavioral Health Clinician [facility]  Internal Medicine Center Direct [phone]  Fax [phone] Main Office Phone: [phone] [address] [place] [place], [place] [address] Website: [facility] Internal Medicine [facility]  [place], [place]  [place]

## 2023-06-04 ENCOUNTER — Other Ambulatory Visit: Payer: Self-pay

## 2023-06-06 ENCOUNTER — Encounter: Payer: Medicaid Other | Admitting: Student

## 2023-06-10 DIAGNOSIS — S12100A Unspecified displaced fracture of second cervical vertebra, initial encounter for closed fracture: Secondary | ICD-10-CM | POA: Diagnosis not present

## 2023-06-10 DIAGNOSIS — J45909 Unspecified asthma, uncomplicated: Secondary | ICD-10-CM | POA: Diagnosis not present

## 2023-06-10 DIAGNOSIS — J410 Simple chronic bronchitis: Secondary | ICD-10-CM | POA: Diagnosis not present

## 2023-06-11 ENCOUNTER — Other Ambulatory Visit: Payer: Self-pay

## 2023-06-11 NOTE — Telephone Encounter (Signed)
Next appt scheduled 8/13 with Dr Hassan Rowan.

## 2023-06-12 DIAGNOSIS — N3010 Interstitial cystitis (chronic) without hematuria: Secondary | ICD-10-CM | POA: Diagnosis not present

## 2023-06-12 DIAGNOSIS — R110 Nausea: Secondary | ICD-10-CM | POA: Diagnosis not present

## 2023-06-12 DIAGNOSIS — R3982 Chronic bladder pain: Secondary | ICD-10-CM | POA: Diagnosis not present

## 2023-06-12 MED ORDER — LORAZEPAM 0.5 MG PO TABS: 0.5000 mg | ORAL_TABLET | Freq: Two times a day (BID) | ORAL | 0 refills | Status: DC | PRN

## 2023-06-18 ENCOUNTER — Ambulatory Visit (INDEPENDENT_AMBULATORY_CARE_PROVIDER_SITE_OTHER): Payer: Medicaid Other | Admitting: Licensed Clinical Social Worker

## 2023-06-18 DIAGNOSIS — F419 Anxiety disorder, unspecified: Secondary | ICD-10-CM

## 2023-06-18 NOTE — BH Specialist Note (Signed)
This is patients fourth no show. Patient no-showed today's appointment; appointment was for Telephone visit at 10:30am  Behavioral Health Clinician [facility] from here on out)  attempted patient twice via Telephone number [phone]    [facility] contacted patient from telephone number [phone]. [facility] left a  VM.   Patient will need to reschedule appointment by calling Internal medicine center [phone].  Christen Butter, MSW, LCSW-A She/Her Behavioral Health Clinician [facility]  Internal Medicine Center Direct Dial:[phone]  Fax [phone] Main Office Phone: [phone] [address] [place] [place], [place] [address] Website: [facility] Internal Medicine [facility]  [place], [place]  [place]

## 2023-06-19 ENCOUNTER — Ambulatory Visit: Payer: Medicaid Other

## 2023-06-19 ENCOUNTER — Telehealth: Payer: Self-pay

## 2023-06-19 NOTE — Telephone Encounter (Signed)
PV was initially started and it was noted (per patient report) that the patient uses home O2;  PV appt stopped and patient notified that she would need to be rescheduled for a different day for her PV once it was established if the patient could have her colonoscopy at [facility] or [facility] (per protocol);  Patient reports she uses 2L [place] daily at home;   PV appt and procedure appts were cancelled;  Please advise if patient can be a direct at [facility] or does this patient need to be scheduled for an OV? Thank you Bre, PV RN

## 2023-06-20 ENCOUNTER — Telehealth: Payer: Self-pay | Admitting: *Deleted

## 2023-06-20 NOTE — Telephone Encounter (Signed)
Called paitent left voice message regarding her mammogram appointment scheduled for 07/11/2023 @ 2:20 pm at the breast center. Patient is to call the breast center if unable to keep this appointment. She is to cancel / reschedule, failing to do so, she will be charged a $75 no show fee. Also appointment information mailed to patient.

## 2023-06-20 NOTE — Telephone Encounter (Signed)
Will one of you please call this patient and have her scheduled for an OV? (See previous messages)-thank you

## 2023-06-21 NOTE — Telephone Encounter (Signed)
LVM for patient to call back to schedule OV.  

## 2023-06-25 ENCOUNTER — Encounter: Payer: Medicaid Other | Admitting: Student

## 2023-07-01 NOTE — Telephone Encounter (Signed)
Please try to call this patient again to have her rescheduled for an OV- Thank you

## 2023-07-02 NOTE — Telephone Encounter (Signed)
Called patient and the phone continues to ring,  was not able to leave voicemail.

## 2023-07-03 ENCOUNTER — Other Ambulatory Visit: Payer: Self-pay

## 2023-07-03 MED ORDER — LORAZEPAM 0.5 MG PO TABS: 0.5000 mg | ORAL_TABLET | Freq: Two times a day (BID) | ORAL | 0 refills | Status: DC | PRN

## 2023-07-04 NOTE — Telephone Encounter (Signed)
Will you please try to contact this patient again Thank you

## 2023-07-04 NOTE — Telephone Encounter (Signed)
Attempted to call patient again, the phone continues to ring. Not able to leave voicemail.

## 2023-07-08 ENCOUNTER — Other Ambulatory Visit: Payer: Self-pay

## 2023-07-08 NOTE — Telephone Encounter (Signed)
Letter mailed to the patient asking she call us to schedule an appointment.

## 2023-07-08 NOTE — Telephone Encounter (Signed)
Will you please send this patient a letter and explain why she needs to have an OV prior to her procedure? Please/Thank you [name]

## 2023-07-09 ENCOUNTER — Encounter: Payer: Medicaid Other | Admitting: Student
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
## 2023-07-10 ENCOUNTER — Encounter: Payer: Medicaid Other | Admitting: Internal Medicine

## 2023-07-10 ENCOUNTER — Other Ambulatory Visit: Payer: Self-pay

## 2023-07-11 ENCOUNTER — Ambulatory Visit: Payer: Medicaid Other

## 2023-07-11 DIAGNOSIS — J45909 Unspecified asthma, uncomplicated: Secondary | ICD-10-CM | POA: Diagnosis not present

## 2023-07-11 DIAGNOSIS — J410 Simple chronic bronchitis: Secondary | ICD-10-CM | POA: Diagnosis not present

## 2023-07-11 DIAGNOSIS — S12100A Unspecified displaced fracture of second cervical vertebra, initial encounter for closed fracture: Secondary | ICD-10-CM | POA: Diagnosis not present

## 2023-07-11 NOTE — Telephone Encounter (Signed)
I have tried to call pt to make and appt  ... But her number states call can't be completed at this time

## 2023-07-17 ENCOUNTER — Encounter: Payer: Medicaid Other | Admitting: Internal Medicine

## 2023-07-17 ENCOUNTER — Encounter: Payer: Self-pay | Admitting: Student
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
## 2023-07-26 ENCOUNTER — Encounter: Payer: Self-pay | Admitting: Internal Medicine

## 2023-07-26 ENCOUNTER — Telehealth: Payer: Self-pay | Admitting: Internal Medicine

## 2023-07-26 NOTE — Telephone Encounter (Signed)
Patient is having a hard time breathing and would like a sample prescription breztri inhaler.

## 2023-07-26 NOTE — Telephone Encounter (Signed)
Patient is calling to know the status of her medicine. She was trying to get it before the weekend. I did let her know that we tried to reach her.

## 2023-07-26 NOTE — Telephone Encounter (Signed)
Atc pt, no answer will try again later

## 2023-07-29 MED ORDER — BREZTRI AEROSPHERE 160-9-4.8 MCG/ACT IN AERO: 2.0000 | INHALATION_SPRAY | Freq: Two times a day (BID) | RESPIRATORY_TRACT | Status: DC

## 2023-07-29 NOTE — Telephone Encounter (Signed)
ATC x2.  Line rings and then goes to a fast busy signal.

## 2023-07-29 NOTE — Telephone Encounter (Signed)
I have refilled this prescription. Atc pt no answer.

## 2023-08-08 ENCOUNTER — Other Ambulatory Visit: Payer: Self-pay | Admitting: Student

## 2023-08-08 ENCOUNTER — Other Ambulatory Visit: Payer: Self-pay

## 2023-08-08 DIAGNOSIS — E039 Hypothyroidism, unspecified: Secondary | ICD-10-CM

## 2023-08-08 NOTE — Telephone Encounter (Signed)
Patient is requesting a rx refill on all of her medications she stated she is out of everything. Patient is scheduled to return 10/21.

## 2023-08-11 DIAGNOSIS — J45909 Unspecified asthma, uncomplicated: Secondary | ICD-10-CM | POA: Diagnosis not present

## 2023-08-11 DIAGNOSIS — J410 Simple chronic bronchitis: Secondary | ICD-10-CM | POA: Diagnosis not present

## 2023-08-11 DIAGNOSIS — S12100A Unspecified displaced fracture of second cervical vertebra, initial encounter for closed fracture: Secondary | ICD-10-CM | POA: Diagnosis not present

## 2023-08-12 MED ORDER — FLUTICASONE PROPIONATE 50 MCG/ACT NA SUSP: 2.0000 | Freq: Every day | NASAL | 2 refills | Status: DC

## 2023-08-12 MED ORDER — LORAZEPAM 0.5 MG PO TABS: 0.5000 mg | ORAL_TABLET | Freq: Two times a day (BID) | ORAL | 0 refills | Status: DC | PRN

## 2023-08-12 MED ORDER — LEVOTHYROXINE SODIUM 125 MCG PO TABS
125.0000 ug | ORAL_TABLET | Freq: Every day | ORAL | 3 refills | Status: DC
Start: 2023-08-12 — End: 2024-10-05

## 2023-08-12 MED ORDER — SERTRALINE HCL 25 MG PO TABS: 25.0000 mg | ORAL_TABLET | Freq: Every day | ORAL | 0 refills | Status: DC

## 2023-08-12 MED ORDER — ALBUTEROL SULFATE HFA 108 (90 BASE) MCG/ACT IN AERS: 2.0000 | INHALATION_SPRAY | Freq: Four times a day (QID) | RESPIRATORY_TRACT | 0 refills | Status: DC | PRN

## 2023-08-12 NOTE — Telephone Encounter (Signed)
Please advise rx refill 

## 2023-08-13 ENCOUNTER — Ambulatory Visit: Payer: Medicaid Other | Admitting: Internal Medicine

## 2023-08-14 DIAGNOSIS — R3982 Chronic bladder pain: Secondary | ICD-10-CM | POA: Diagnosis not present

## 2023-08-14 DIAGNOSIS — R110 Nausea: Secondary | ICD-10-CM | POA: Diagnosis not present

## 2023-08-14 DIAGNOSIS — N3010 Interstitial cystitis (chronic) without hematuria: Secondary | ICD-10-CM | POA: Diagnosis not present

## 2023-08-16 ENCOUNTER — Encounter: Payer: Self-pay | Admitting: Internal Medicine

## 2023-08-19 ENCOUNTER — Telehealth: Payer: Self-pay | Admitting: *Deleted

## 2023-08-19 NOTE — Telephone Encounter (Signed)
Was unable to contact patient at her phone number/call was regarding her missed appointment for mammogram that was scheduled for August 29.2024 @ 2:20 pm

## 2023-08-23 ENCOUNTER — Other Ambulatory Visit: Payer: Self-pay | Admitting: Internal Medicine

## 2023-08-23 ENCOUNTER — Other Ambulatory Visit: Payer: Self-pay | Admitting: Student

## 2023-09-02 ENCOUNTER — Encounter: Payer: Medicaid Other | Admitting: Student

## 2023-09-02 ENCOUNTER — Encounter: Payer: Self-pay | Admitting: Adult Health

## 2023-09-10 ENCOUNTER — Telehealth: Payer: Self-pay | Admitting: *Deleted

## 2023-09-10 NOTE — Telephone Encounter (Signed)
Called patient unable to leave message / recording unable to leave message hang up and try later.  This call was to inform patient of her her mammogram appointment 10/04/2023 to arrive 1:50 pm -[facility] -[phone]. There will be a $75 no show fee charged for any appointment not r/s to cancelled.

## 2023-09-11 ENCOUNTER — Telehealth: Payer: Self-pay | Admitting: Internal Medicine

## 2023-09-11 NOTE — Telephone Encounter (Signed)
PT wants Breztri samples left Having trouble breathing.

## 2023-09-12 NOTE — Telephone Encounter (Signed)
Called pt no answer, unable to leave vmm

## 2023-09-12 NOTE — Telephone Encounter (Signed)
Pt calling back

## 2023-09-13 NOTE — Telephone Encounter (Signed)
ATC X1 No answer unable to leave voicemail.

## 2023-09-16 NOTE — Telephone Encounter (Signed)
Called the pt again and no answer, no option to leave msg  Will close per protocol

## 2023-09-17 DIAGNOSIS — N3010 Interstitial cystitis (chronic) without hematuria: Secondary | ICD-10-CM | POA: Diagnosis not present

## 2023-09-18 ENCOUNTER — Encounter (HOSPITAL_BASED_OUTPATIENT_CLINIC_OR_DEPARTMENT_OTHER): Payer: Medicaid Other

## 2023-09-18 ENCOUNTER — Telehealth: Payer: Self-pay

## 2023-09-18 ENCOUNTER — Telehealth: Payer: Self-pay | Admitting: Internal Medicine

## 2023-09-18 DIAGNOSIS — J441 Chronic obstructive pulmonary disease with (acute) exacerbation: Secondary | ICD-10-CM
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
## 2023-09-18 NOTE — Telephone Encounter (Signed)
PT apologizes that she missed our calls about Breztri samples. Please try her again.  [facility]  Also ,she needs nebulizer solution refilled.   Her # is [phone]

## 2023-09-18 NOTE — Telephone Encounter (Signed)
Next appt scheduled 11/21 with Dr Versie Starks.

## 2023-09-19 ENCOUNTER — Other Ambulatory Visit: Payer: Medicaid Other

## 2023-09-19 MED ORDER — ALBUTEROL SULFATE (2.5 MG/3ML) 0.083% IN NEBU: 5.0000 mg | INHALATION_SOLUTION | RESPIRATORY_TRACT | 1 refills | Status: DC | PRN

## 2023-09-19 NOTE — Telephone Encounter (Signed)
Called and spoke to patient.  She is requesting sample of Breztri. Team message sent to [facility] requesting that she place a sample of up front pick up. Patient will come by the office after lunch to pickup.  Albuterol solution sent preferred pharmacy per patient request.  Nothing further needed.

## 2023-09-24 ENCOUNTER — Telehealth: Payer: Self-pay | Admitting: Internal Medicine

## 2023-09-24 NOTE — Telephone Encounter (Signed)
Left voice mail informing patient we cancelled the Wednesday 09/25/23 11:30 am appointment because the PFT appointment was rescheduled to January 2025.  Dr. Thurston Hole follow up was rescheduled as well .  Requested patient call with questions or concerns.

## 2023-09-25 ENCOUNTER — Ambulatory Visit: Payer: Medicaid Other | Admitting: Internal Medicine

## 2023-10-03 ENCOUNTER — Encounter: Payer: Medicaid Other | Admitting: Student

## 2023-10-03 DIAGNOSIS — N3010 Interstitial cystitis (chronic) without hematuria: Secondary | ICD-10-CM | POA: Diagnosis not present

## 2023-10-04 ENCOUNTER — Ambulatory Visit (INDEPENDENT_AMBULATORY_CARE_PROVIDER_SITE_OTHER): Payer: Medicaid Other

## 2023-10-04 ENCOUNTER — Encounter (HOSPITAL_COMMUNITY): Payer: Self-pay

## 2023-10-04 ENCOUNTER — Ambulatory Visit (HOSPITAL_COMMUNITY)
Admission: EM | Admit: 2023-10-04 | Discharge: 2023-10-04 | Disposition: A | Discharge status: Home / Self Care | Payer: Medicaid Other | Attending: Family Medicine | Admitting: Family Medicine

## 2023-10-04 ENCOUNTER — Ambulatory Visit: Payer: Medicaid Other

## 2023-10-04 DIAGNOSIS — J441 Chronic obstructive pulmonary disease with (acute) exacerbation: Secondary | ICD-10-CM

## 2023-10-04 DIAGNOSIS — J189 Pneumonia, unspecified organism: Secondary | ICD-10-CM | POA: Diagnosis not present

## 2023-10-04 MED ORDER — IPRATROPIUM-ALBUTEROL 0.5-2.5 (3) MG/3ML IN SOLN: 3.0000 mL | Freq: Four times a day (QID) | RESPIRATORY_TRACT | 0 refills | Status: DC | PRN

## 2023-10-04 MED ORDER — KETOROLAC TROMETHAMINE 30 MG/ML IJ SOLN
INTRAMUSCULAR | Status: AC
  Filled 2023-10-04: qty 1

## 2023-10-04 MED ORDER — KETOROLAC TROMETHAMINE 30 MG/ML IJ SOLN
30.0000 mg | Freq: Once | INTRAMUSCULAR | Status: AC
  Administered 2023-10-04: 30 mg via INTRAMUSCULAR

## 2023-10-04 MED ORDER — DOXYCYCLINE HYCLATE 100 MG PO CAPS: 100.0000 mg | ORAL_CAPSULE | Freq: Two times a day (BID) | ORAL | 0 refills | Status: AC

## 2023-10-04 MED ORDER — PREDNISONE 20 MG PO TABS: 40.0000 mg | ORAL_TABLET | Freq: Every day | ORAL | 0 refills | Status: AC

## 2023-10-04 MED ORDER — PROMETHAZINE-DM 6.25-15 MG/5ML PO SYRP: 5.0000 mL | ORAL_SOLUTION | Freq: Four times a day (QID) | ORAL | 0 refills | Status: DC | PRN

## 2023-10-04 NOTE — ED Triage Notes (Signed)
Chest tightness, states she called the ambulance to her house Sunday for SOB. Patient has COPD. Patient having a cough and chest congestion with pain and SOB. Patient states this feels like when she had pneumonia last year.

## 2023-10-04 NOTE — Discharge Instructions (Signed)
By my review your chest x-ray has a possible pneumonia on the right lower side.  Radiology will read your x-rays also and give [place] a report.  If there is anything significantly different, our staff will call you to let you know.  Ipratropium-albuterol--1 ampoule nebulized every 6 hours as needed for wheezing or shortness of breath  Take doxycycline 100 mg --1 capsule 2 times daily for 7 days  Take prednisone 20 mg--2 daily for 5 days; this is for inflammation in your lungs  Take Phenergan with dextromethorphan syrup--5 mL or 1 teaspoon every 6 hours as needed for cough
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
## 2023-10-16 ENCOUNTER — Other Ambulatory Visit: Payer: Self-pay | Admitting: Student

## 2023-10-18 MED ORDER — SERTRALINE HCL 25 MG PO TABS: 25.0000 mg | ORAL_TABLET | Freq: Every day | ORAL | 0 refills | Status: DC

## 2023-10-21 ENCOUNTER — Encounter: Payer: Medicaid Other | Admitting: Student

## 2023-10-31 ENCOUNTER — Ambulatory Visit: Payer: Medicaid Other | Admitting: Student

## 2023-10-31 VITALS — BP 126/86 | HR 72 | Temp 97.6°F | Ht 60.0 in | Wt 101.6 lb

## 2023-10-31 DIAGNOSIS — S22000D Wedge compression fracture of unspecified thoracic vertebra, subsequent encounter for fracture with routine healing: Secondary | ICD-10-CM

## 2023-10-31 DIAGNOSIS — R051 Acute cough: Secondary | ICD-10-CM

## 2023-10-31 DIAGNOSIS — F319 Bipolar disorder, unspecified: Secondary | ICD-10-CM | POA: Diagnosis not present

## 2023-10-31 DIAGNOSIS — J449 Chronic obstructive pulmonary disease, unspecified: Secondary | ICD-10-CM

## 2023-10-31 DIAGNOSIS — S22000A Wedge compression fracture of unspecified thoracic vertebra, initial encounter for closed fracture: Secondary | ICD-10-CM | POA: Insufficient documentation

## 2023-10-31 DIAGNOSIS — F411 Generalized anxiety disorder: Secondary | ICD-10-CM | POA: Diagnosis present

## 2023-10-31 DIAGNOSIS — E038 Other specified hypothyroidism: Secondary | ICD-10-CM

## 2023-10-31 DIAGNOSIS — S22000S Wedge compression fracture of unspecified thoracic vertebra, sequela: Secondary | ICD-10-CM

## 2023-10-31 MED ORDER — LORAZEPAM 0.5 MG PO TABS: 0.5000 mg | ORAL_TABLET | Freq: Two times a day (BID) | ORAL | 0 refills | Status: DC | PRN

## 2023-10-31 MED ORDER — BENZONATATE 100 MG PO CAPS: 100.0000 mg | ORAL_CAPSULE | Freq: Three times a day (TID) | ORAL | 1 refills | Status: DC | PRN

## 2023-10-31 MED ORDER — SERTRALINE HCL 50 MG PO TABS: 50.0000 mg | ORAL_TABLET | Freq: Every day | ORAL | 2 refills | Status: DC

## 2023-10-31 MED ORDER — FLUTICASONE PROPIONATE 50 MCG/ACT NA SUSP: 2.0000 | Freq: Every day | NASAL | 2 refills | Status: AC
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
## 2023-10-31 NOTE — Assessment & Plan Note (Signed)
Currently on levothyroxine 125 mcg daily.  Last TSH was normal at 4, plan to recheck today  Plan -Continue levothyroxine 125 mcg -Repeat TSH today

## 2023-10-31 NOTE — Assessment & Plan Note (Addendum)
Reports nasal congestion and cough for past 2 weeks that has continued since her ED visit on 11/22.  Was treated for presumed pneumonia then but did not tolerate doxycycline so did not complete treatment.  Was also given prednisone at that time for COPD.  Exam today showed normal work of breathing on room air with saturations greater than 93%.  She is afebrile and otherwise hemodynamically stable.  Lung exam showed diffuse expiratory wheezing but no accessory muscle use.  Patient able to speak in full sentences.  Plan -Repeat CXR, if concern for pneumonia then start ABX -Continue triple inhaler therapy and nebulizer treatments. -Start Flonase daily  -Start [employer] 3 times daily as needed

## 2023-10-31 NOTE — Assessment & Plan Note (Addendum)
Noted on recent CXR showed a stable T5, T6 and T9 vertebral compression deformities.  Suspect osteoporosis given compression fractures. Last DXA done in 2010 showed osteopenia at that time. No recent DXA.   Plan -BMP today -Vitamin D level today -Follow-up and consider initiating bisphosphonate therapy and need for repeat DXA scan

## 2023-10-31 NOTE — Patient Instructions (Addendum)
Thank you, Ms.Thelma Comp for allowing [place] to provide your care today. Today we discussed your health today.  -I am sorry to hear you are not feeling well today.  -Start Flonase to help with congestion. -Start Tessalon perles three times a day as needed for cough -Use your nebulizer at home as needed for wheezing and continue your triple inhaler therapy with follow up with your lung doctor -Repeat chest x-ray today, can get upstairs on 1st floor   -Increase Zoloft to 50 mg daily  -Ativan 0.5 mg twice a day AS NEEDED, please reserve this for only severe anxiety attacks and try NOT to take it daily  -Referral to psychiatry sent today  -Blood work today to check vitamin D, electrolytes and thyroid   I have ordered the following labs for you: Lab Orders         TSH         Vitamin D (25 hydroxy)         BMP8+Anion Gap      Referrals ordered today:  Referral Orders         Ambulatory referral to Psychiatry      I have ordered the following medication/changed the following medications:   Stop the following medications: Medications Discontinued During This Encounter  Medication Reason   sertraline (ZOLOFT) 25 MG tablet    fluticasone (FLONASE) 50 MCG/ACT nasal spray Reorder   LORazepam (ATIVAN) 0.5 MG tablet Reorder     Start the following medications: Meds ordered this encounter  Medications   fluticasone (FLONASE) 50 MCG/ACT nasal spray    Sig: Place 2 sprays into both nostrils daily.    Dispense:  16 g    Refill:  2   sertraline (ZOLOFT) 50 MG tablet    Sig: Take 1 tablet (50 mg total) by mouth daily.    Dispense:  30 tablet    Refill:  2   benzonatate (TESSALON PERLES) 100 MG capsule    Sig: Take 1 capsule (100 mg total) by mouth 3 (three) times daily as needed for cough.    Dispense:  30 capsule    Refill:  1   LORazepam (ATIVAN) 0.5 MG tablet    Sig: Take 1 tablet (0.5 mg total) by mouth 2 (two) times daily as needed for anxiety.    Dispense:  10 tablet    Refill:   0     Follow up:  1-2 months    Should you have any questions or concerns please call the internal medicine clinic at [phone].    [name], D.O. [facility]

## 2023-10-31 NOTE — Assessment & Plan Note (Addendum)
Currently on sertraline 25 mg daily and lorazepam 0.5 mg twice daily as needed.  Reports anxiety most days.  Tearful during encounter today.  Reports being on hydroxyzine without significant improvement.  Discussed lorazepam is not a daily medication and should be used sparingly which patient verbalizes understanding.  Will fill only limited amount for 30 days. She will need follow-up with psychiatry hopefully soon.   Plan -New referral to psychiatry placed today -Increase sertraline to 50 mg daily -Refilled lorazepam 0.5 mg twice daily as needed (10 tablets, 0 refills for 1 month), pending psychiatry referral

## 2023-10-31 NOTE — Assessment & Plan Note (Signed)
New referral to psychiatry placed today.

## 2023-11-01 LAB — BMP8+ANION GAP
Anion Gap: 15 mmol/L (ref 10.0–18.0)
BUN/Creatinine Ratio: 14 (ref 9–23)
BUN: 10 mg/dL (ref 6–24)
CO2: 25 mmol/L (ref 20–29)
Calcium: 9.7 mg/dL (ref 8.7–10.2)
Chloride: 98 mmol/L (ref 96–106)
Creatinine, Ser: 0.7 mg/dL (ref 0.57–1.00)
Glucose: 78 mg/dL (ref 70–99)
Potassium: 4 mmol/L (ref 3.5–5.2)
Sodium: 138 mmol/L (ref 134–144)
eGFR: 105 mL/min/{1.73_m2} (ref >59)

## 2023-11-01 LAB — VITAMIN D 25 HYDROXY (VIT D DEFICIENCY, FRACTURES): Vit D, 25-Hydroxy: 31.8 ng/mL (ref 30.0–100.0)

## 2023-11-01 LAB — TSH: TSH: 23.1 u[IU]/mL — ABNORMAL HIGH (ref 0.450–4.500)

## 2023-11-01 NOTE — Progress Notes (Signed)
Internal Medicine Clinic Attending  Case discussed with the resident at the time of the visit.  We reviewed the resident's history and exam and pertinent patient test results.  I agree with the assessment, diagnosis, and plan of care documented in the resident's note.  

## 2023-11-01 NOTE — Assessment & Plan Note (Signed)
Noted expiratory wheezing on exam, stable and saturating above 93% on RA, able to speak in full sentences and no signs of distress. Saw pulmonology in July and next follow up in January.  Plan -Continue with triple inhaler therapy and nebulizer PRN -Continue following with pulmonology in January

## 2023-11-20 ENCOUNTER — Encounter: Payer: Self-pay | Admitting: Student

## 2023-11-22 ENCOUNTER — Encounter: Payer: Self-pay | Admitting: Student

## 2023-11-26 ENCOUNTER — Other Ambulatory Visit: Payer: Self-pay | Admitting: Internal Medicine

## 2023-11-26 ENCOUNTER — Other Ambulatory Visit: Payer: Self-pay | Admitting: Student

## 2023-11-29 ENCOUNTER — Telehealth: Payer: Self-pay

## 2023-11-29 NOTE — Telephone Encounter (Signed)
Pt states she would like to get LORazepam (ATIVAN) 0.5 MG tablet by today instead 12/03/2023. Pt did called the pharmacy and they told her the doctor had put a note stating medication needs to be filled on 12/03/2023. Pt states she really need this medication by today before the weekend.
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
## 2023-12-06 ENCOUNTER — Ambulatory Visit: Payer: Medicaid Other | Admitting: Internal Medicine

## 2023-12-10 ENCOUNTER — Telehealth: Payer: Self-pay | Admitting: Internal Medicine

## 2023-12-10 ENCOUNTER — Ambulatory Visit (HOSPITAL_COMMUNITY): Payer: Medicaid Other | Admitting: Student

## 2023-12-10 MED ORDER — AZITHROMYCIN 250 MG PO TABS: ORAL_TABLET | ORAL | 0 refills | Status: DC

## 2023-12-10 MED ORDER — BREZTRI AEROSPHERE 160-9-4.8 MCG/ACT IN AERO: 2.0000 | INHALATION_SPRAY | Freq: Two times a day (BID) | RESPIRATORY_TRACT | Status: DC

## 2023-12-10 MED ORDER — PREDNISONE 10 MG PO TABS: ORAL_TABLET | ORAL | 0 refills | Status: DC
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
## 2023-12-10 NOTE — Telephone Encounter (Signed)
Melinda Cowden, MD to Me     12/10/23  4:37 PM  Ok but must keep next appt or won't be able to rx by phone Zpak/Prednisone 10 mg take  4 each am x 2 days,   2 each am x 2 days,  1 each am x 2 days and stop sample of breztr all ok

## 2023-12-10 NOTE — Telephone Encounter (Signed)
Patient is sick and needs antibiotic and prednisone. She would also like a sample of Breztri.  Pharmacy: [facility] on [address]

## 2023-12-11 ENCOUNTER — Other Ambulatory Visit: Payer: Self-pay | Admitting: Student

## 2023-12-11 NOTE — Telephone Encounter (Signed)
I spoke with the pt 12/10/23 and she is aware of response from Dr Sherene Sires  Samples of Markus Daft were left  Pred and zpack sent to preferred pharm  Nothing further needed

## 2023-12-16 ENCOUNTER — Telehealth: Payer: Self-pay | Admitting: *Deleted

## 2023-12-16 NOTE — Telephone Encounter (Signed)
LVM for patient regarding mammogram she need to schedule, she is to call [phone] to make that appointment / patient to call our office if she want to cancel this referral. / made patient aware of the $75 no show fee for not keeping and canceling ahead of time.

## 2023-12-17 ENCOUNTER — Telehealth: Payer: Self-pay | Admitting: *Deleted

## 2023-12-17 NOTE — Telephone Encounter (Signed)
Called patient x2 regarding her mammogram. LVM for patient fo contact mammogram office at [phone] to schedule her appointment / also there will be a $75 no show fee for not keeping appointment or calling to cancel or reschedule .

## 2023-12-18 ENCOUNTER — Encounter: Payer: Medicaid Other | Admitting: Student
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
## 2023-12-29 ENCOUNTER — Other Ambulatory Visit: Payer: Self-pay | Admitting: Nurse Practitioner

## 2023-12-29 DIAGNOSIS — J441 Chronic obstructive pulmonary disease with (acute) exacerbation: Secondary | ICD-10-CM

## 2024-01-03 ENCOUNTER — Encounter (HOSPITAL_COMMUNITY): Payer: Self-pay | Admitting: Emergency Medicine

## 2024-01-03 ENCOUNTER — Emergency Department (HOSPITAL_COMMUNITY): Payer: Medicaid Other

## 2024-01-03 ENCOUNTER — Other Ambulatory Visit: Payer: Self-pay

## 2024-01-03 ENCOUNTER — Observation Stay (HOSPITAL_COMMUNITY)
Admission: EM | Admit: 2024-01-03 | Discharge: 2024-01-04 | Disposition: A | Discharge status: Home / Self Care | Payer: Medicaid Other | Attending: Family Medicine | Admitting: Family Medicine

## 2024-01-03 DIAGNOSIS — J441 Chronic obstructive pulmonary disease with (acute) exacerbation: Secondary | ICD-10-CM | POA: Diagnosis not present

## 2024-01-03 DIAGNOSIS — R0602 Shortness of breath: Secondary | ICD-10-CM | POA: Diagnosis present

## 2024-01-03 DIAGNOSIS — J309 Allergic rhinitis, unspecified: Secondary | ICD-10-CM | POA: Diagnosis present

## 2024-01-03 DIAGNOSIS — J101 Influenza due to other identified influenza virus with other respiratory manifestations: Secondary | ICD-10-CM | POA: Diagnosis present

## 2024-01-03 DIAGNOSIS — J3089 Other allergic rhinitis: Secondary | ICD-10-CM | POA: Diagnosis not present

## 2024-01-03 DIAGNOSIS — E039 Hypothyroidism, unspecified: Secondary | ICD-10-CM | POA: Diagnosis not present

## 2024-01-03 DIAGNOSIS — F419 Anxiety disorder, unspecified: Secondary | ICD-10-CM | POA: Insufficient documentation

## 2024-01-03 DIAGNOSIS — J9611 Chronic respiratory failure with hypoxia: Secondary | ICD-10-CM | POA: Diagnosis present

## 2024-01-03 DIAGNOSIS — J301 Allergic rhinitis due to pollen: Secondary | ICD-10-CM

## 2024-01-03 DIAGNOSIS — J9601 Acute respiratory failure with hypoxia: Secondary | ICD-10-CM | POA: Insufficient documentation

## 2024-01-03 DIAGNOSIS — F411 Generalized anxiety disorder: Secondary | ICD-10-CM | POA: Diagnosis present

## 2024-01-03 LAB — RAPID URINE DRUG SCREEN, HOSP PERFORMED
Amphetamines: NOT DETECTED
Barbiturates: NOT DETECTED
Benzodiazepines: POSITIVE — AB
Cocaine: POSITIVE — AB
Opiates: NOT DETECTED
Tetrahydrocannabinol: POSITIVE — AB

## 2024-01-03 LAB — CBC WITH DIFFERENTIAL/PLATELET
Abs Immature Granulocytes: 0.01 10*3/uL (ref 0.00–0.07)
Basophils Absolute: 0.1 10*3/uL (ref 0.0–0.1)
Basophils Relative: 1 %
Eosinophils Absolute: 0.1 10*3/uL (ref 0.0–0.5)
Eosinophils Relative: 1 %
HCT: 43.6 % (ref 36.0–46.0)
Hemoglobin: 14.3 g/dL (ref 12.0–15.0)
Immature Granulocytes: 0 %
Lymphocytes Relative: 5 %
Lymphs Abs: 0.4 10*3/uL — ABNORMAL LOW (ref 0.7–4.0)
MCH: 30.3 pg (ref 26.0–34.0)
MCHC: 32.8 g/dL (ref 30.0–36.0)
MCV: 92.4 fL (ref 80.0–100.0)
Monocytes Absolute: 0.2 10*3/uL (ref 0.1–1.0)
Monocytes Relative: 3 %
Neutro Abs: 7 10*3/uL (ref 1.7–7.7)
Neutrophils Relative %: 90 %
Platelets: 217 10*3/uL (ref 150–400)
RBC: 4.72 MIL/uL (ref 3.87–5.11)
RDW: 12.9 % (ref 11.5–15.5)
WBC: 7.8 10*3/uL (ref 4.0–10.5)
nRBC: 0 % (ref 0.0–0.2)

## 2024-01-03 LAB — COMPREHENSIVE METABOLIC PANEL
ALT: 18 U/L (ref 0–44)
AST: 23 U/L (ref 15–41)
Albumin: 3.6 g/dL (ref 3.5–5.0)
Alkaline Phosphatase: 45 U/L (ref 38–126)
Anion gap: 11 (ref 5–15)
BUN: 11 mg/dL (ref 6–20)
CO2: 22 mmol/L (ref 22–32)
Calcium: 9.3 mg/dL (ref 8.9–10.3)
Chloride: 103 mmol/L (ref 98–111)
Creatinine, Ser: 0.69 mg/dL (ref 0.44–1.00)
GFR, Estimated: >60 mL/min (ref >60)
Glucose, Bld: 107 mg/dL — ABNORMAL HIGH (ref 70–99)
Potassium: 3.7 mmol/L (ref 3.5–5.1)
Sodium: 136 mmol/L (ref 135–145)
Total Bilirubin: 0.5 mg/dL (ref 0.0–1.2)
Total Protein: 6.6 g/dL (ref 6.5–8.1)

## 2024-01-03 LAB — BLOOD GAS, VENOUS
Acid-Base Excess: 1.8 mmol/L (ref 0.0–2.0)
Bicarbonate: 25.7 mmol/L (ref 20.0–28.0)
Drawn by: 71108
O2 Saturation: 96.6 %
Patient temperature: 37
pCO2, Ven: 37 mm[Hg] — ABNORMAL LOW (ref 44–60)
pH, Ven: 7.45 — ABNORMAL HIGH (ref 7.25–7.43)
pO2, Ven: 74 mm[Hg] — ABNORMAL HIGH (ref 32–45)

## 2024-01-03 LAB — PROCALCITONIN: Procalcitonin: <0.1 ng/mL

## 2024-01-03 LAB — RESP PANEL BY RT-PCR (RSV, FLU A&B, COVID)  RVPGX2
Influenza A by PCR: POSITIVE — AB
Influenza B by PCR: NEGATIVE
Resp Syncytial Virus by PCR: NEGATIVE
SARS Coronavirus 2 by RT PCR: NEGATIVE

## 2024-01-03 LAB — MAGNESIUM: Magnesium: 1.8 mg/dL (ref 1.7–2.4)

## 2024-01-03 LAB — PHOSPHORUS: Phosphorus: 1.9 mg/dL — ABNORMAL LOW (ref 2.5–4.6)

## 2024-01-03 LAB — BRAIN NATRIURETIC PEPTIDE: B Natriuretic Peptide: 35.9 pg/mL (ref 0.0–100.0)

## 2024-01-03 LAB — TROPONIN I (HIGH SENSITIVITY)
Troponin I (High Sensitivity): 3 ng/L (ref <18)
Troponin I (High Sensitivity): 3 ng/L (ref <18)

## 2024-01-03 MED ORDER — LORAZEPAM 2 MG/ML IJ SOLN
1.0000 mg | Freq: Four times a day (QID) | INTRAMUSCULAR | Status: DC | PRN
  Administered 2024-01-03 – 2024-01-04 (×2): 1 mg via INTRAVENOUS
  Filled 2024-01-03 (×3): qty 1

## 2024-01-03 MED ORDER — MELATONIN 3 MG PO TABS
3.0000 mg | ORAL_TABLET | Freq: Every evening | ORAL | Status: DC | PRN
  Administered 2024-01-03: 3 mg via ORAL
  Filled 2024-01-03: qty 1

## 2024-01-03 MED ORDER — MENTHOL 3 MG MT LOZG: 1.0000 | LOZENGE | OROMUCOSAL | Status: DC | PRN

## 2024-01-03 MED ORDER — LORAZEPAM 0.5 MG PO TABS
0.5000 mg | ORAL_TABLET | Freq: Two times a day (BID) | ORAL | Status: DC | PRN
  Administered 2024-01-03: 0.5 mg via ORAL
  Filled 2024-01-03: qty 1

## 2024-01-03 MED ORDER — BENZONATATE 100 MG PO CAPS
200.0000 mg | ORAL_CAPSULE | Freq: Three times a day (TID) | ORAL | Status: DC | PRN
Start: 2024-01-03 — End: 2024-01-04
  Administered 2024-01-03 – 2024-01-04 (×2): 200 mg via ORAL
  Filled 2024-01-03 (×2): qty 2

## 2024-01-03 MED ORDER — ALPRAZOLAM 0.25 MG PO TABS
0.2500 mg | ORAL_TABLET | Freq: Three times a day (TID) | ORAL | Status: DC | PRN
  Administered 2024-01-03: 0.25 mg via ORAL
  Filled 2024-01-03: qty 1

## 2024-01-03 MED ORDER — FLUTICASONE PROPIONATE 50 MCG/ACT NA SUSP
2.0000 | Freq: Every day | NASAL | Status: DC
  Filled 2024-01-03 (×2): qty 16

## 2024-01-03 MED ORDER — IPRATROPIUM-ALBUTEROL 0.5-2.5 (3) MG/3ML IN SOLN
3.0000 mL | Freq: Four times a day (QID) | RESPIRATORY_TRACT | Status: DC
  Administered 2024-01-03 – 2024-01-04 (×4): 3 mL via RESPIRATORY_TRACT
  Filled 2024-01-03 (×4): qty 3

## 2024-01-03 MED ORDER — K PHOS MONO-SOD PHOS DI & MONO 155-852-130 MG PO TABS
500.0000 mg | ORAL_TABLET | Freq: Two times a day (BID) | ORAL | Status: DC
  Administered 2024-01-03 (×2): 500 mg via ORAL
  Filled 2024-01-03 (×2): qty 2

## 2024-01-03 MED ORDER — IPRATROPIUM-ALBUTEROL 0.5-2.5 (3) MG/3ML IN SOLN
3.0000 mL | Freq: Once | RESPIRATORY_TRACT | Status: AC
Start: 2024-01-03 — End: 2024-01-03
  Administered 2024-01-03: 3 mL via RESPIRATORY_TRACT

## 2024-01-03 MED ORDER — ACETAMINOPHEN 650 MG RE SUPP: 650.0000 mg | Freq: Four times a day (QID) | RECTAL | Status: DC | PRN

## 2024-01-03 MED ORDER — ACETAMINOPHEN 325 MG PO TABS
650.0000 mg | ORAL_TABLET | Freq: Four times a day (QID) | ORAL | Status: DC | PRN
  Administered 2024-01-03 – 2024-01-04 (×4): 650 mg via ORAL
  Filled 2024-01-03 (×4): qty 2

## 2024-01-03 MED ORDER — ONDANSETRON HCL 4 MG/2ML IJ SOLN
4.0000 mg | Freq: Four times a day (QID) | INTRAMUSCULAR | Status: DC | PRN
  Administered 2024-01-04: 4 mg via INTRAVENOUS
  Filled 2024-01-03: qty 2

## 2024-01-03 MED ORDER — GUAIFENESIN ER 600 MG PO TB12
600.0000 mg | ORAL_TABLET | Freq: Two times a day (BID) | ORAL | Status: DC
  Administered 2024-01-03 – 2024-01-04 (×3): 600 mg via ORAL
  Filled 2024-01-03 (×3): qty 1

## 2024-01-03 MED ORDER — MAGNESIUM SULFATE IN D5W 1-5 GM/100ML-% IV SOLN
1.0000 g | Freq: Once | INTRAVENOUS | Status: AC
  Administered 2024-01-03: 1 g via INTRAVENOUS
  Filled 2024-01-03: qty 100

## 2024-01-03 MED ORDER — LORAZEPAM 1 MG PO TABS
1.0000 mg | ORAL_TABLET | Freq: Once | ORAL | Status: AC
  Administered 2024-01-03: 1 mg via ORAL
  Filled 2024-01-03: qty 1

## 2024-01-03 MED ORDER — ZOLPIDEM TARTRATE 5 MG PO TABS
5.0000 mg | ORAL_TABLET | Freq: Every evening | ORAL | Status: DC | PRN
  Administered 2024-01-03: 5 mg via ORAL
  Filled 2024-01-03: qty 1

## 2024-01-03 MED ORDER — LEVOTHYROXINE SODIUM 25 MCG PO TABS
125.0000 ug | ORAL_TABLET | Freq: Every day | ORAL | Status: DC
  Administered 2024-01-03 – 2024-01-04 (×2): 125 ug via ORAL
  Filled 2024-01-03 (×2): qty 1

## 2024-01-03 MED ORDER — SERTRALINE HCL 50 MG PO TABS
50.0000 mg | ORAL_TABLET | Freq: Every day | ORAL | Status: DC
  Administered 2024-01-03 – 2024-01-04 (×2): 50 mg via ORAL
  Filled 2024-01-03 (×2): qty 1

## 2024-01-03 MED ORDER — ALBUTEROL SULFATE (2.5 MG/3ML) 0.083% IN NEBU
2.5000 mg | INHALATION_SOLUTION | RESPIRATORY_TRACT | Status: DC | PRN
Start: 2024-01-03 — End: 2024-01-04
  Administered 2024-01-04: 2.5 mg via RESPIRATORY_TRACT
  Filled 2024-01-03: qty 3

## 2024-01-03 MED ORDER — SODIUM CHLORIDE 0.9 % IV SOLN
500.0000 mg | INTRAVENOUS | Status: DC
  Administered 2024-01-03 – 2024-01-04 (×2): 500 mg via INTRAVENOUS
  Filled 2024-01-03 (×2): qty 5

## 2024-01-03 MED ORDER — METHYLPREDNISOLONE SODIUM SUCC 125 MG IJ SOLR
80.0000 mg | Freq: Two times a day (BID) | INTRAMUSCULAR | Status: DC
  Administered 2024-01-03 – 2024-01-04 (×3): 80 mg via INTRAVENOUS
  Filled 2024-01-03 (×3): qty 2

## 2024-01-03 MED ORDER — K PHOS MONO-SOD PHOS DI & MONO 155-852-130 MG PO TABS
250.0000 mg | ORAL_TABLET | Freq: Two times a day (BID) | ORAL | Status: DC
  Filled 2024-01-03: qty 1

## 2024-01-03 MED ORDER — ALBUTEROL SULFATE (2.5 MG/3ML) 0.083% IN NEBU
10.0000 mg/h | INHALATION_SOLUTION | Freq: Once | RESPIRATORY_TRACT | Status: AC
  Administered 2024-01-03: 10 mg/h via RESPIRATORY_TRACT
  Filled 2024-01-03: qty 12

## 2024-01-03 NOTE — Progress Notes (Signed)
This is a pleasant 53 year old lady with history of severe COPD, chronic hypoxic respiratory requires 2 L of oxygen continuous nasal cannula, hypothyroidism and many other comorbidities who presented to ED with shortness of breath and admitted under hospital service after midnight for acute COPD exacerbation secondary to influenza A.  She was started on antibiotics, steroids and bronchodilators.  Tamiflu was not started since she was out of the window, symptoms since 4 days.  Patient seen and examined in the ED.  She still complains very minimal improvement, still feels significant shortness of breath and very weak.  On examination, she has expiratory wheezes and left lower lobe rhonchi.  Chest x-ray unremarkable for any infiltrates or consolidation and Pro-Cal negative as well.  No leukocytosis and no fever.  No signs of pneumonia.  She has hypophosphatemia, will increase dose of K-Phos.  Will try to wean oxygen.  Continue rest of the management. Total time spent 31 minutes

## 2024-01-03 NOTE — Progress Notes (Addendum)
Page sent to MD [facility] regarding pt request to see him at bedside. When asked if we could assist her, she said she just needed to see the MD.   MD Pahwani responded via phone call and advised he would respond to the bedside when he was able. Pt notified pf MD response

## 2024-01-03 NOTE — ED Triage Notes (Signed)
Pt from home. Very anxious bc ran out of her ativan for 2 weeks. Reports everyone at home has flu symptoms. She was working to breath with wheezing and rhonchi. EMS gave solumedrol and breathing tx. Pt's symptoms did improve. COPD and recurring pneumonia hx.

## 2024-01-03 NOTE — Progress Notes (Signed)
Pt was observed, from window by charting area, removing a white powder substance from her wallet and placing it on the back of her phone. This RN responded to the room and observed pt trying to conceal her phone. When asked about it she wiped the substance off onto her blanket and denied the substance was there. When asked to see what she had in her hand she produced a rolled up piece of paper. This RN requested the charge nurse Romeo Apple RN respond to the room as well as security for a belongings check. Pt admitted to having illegal substances "weed and crushed oxy" in her purse. Pt advised staff that she snorts the oxy but denied cocaine use. MD Pahwani notified, and advised to follow hospital policy. Security bagged pt belongings and placed them at the front desk. Unit Manager L. Hunter notified of incident.

## 2024-01-03 NOTE — Plan of Care (Signed)
  Problem: Clinical Measurements: Goal: Ability to maintain clinical measurements within normal limits will improve Outcome: Progressing   Problem: Nutrition: Goal: Adequate nutrition will be maintained Outcome: Progressing   Problem: Coping: Goal: Level of anxiety will decrease Outcome: Progressing   Problem: Pain Managment: Goal: General experience of comfort will improve and/or be controlled Outcome: Progressing
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
## 2024-01-03 NOTE — ED Notes (Signed)
Pt assisted onto bedpan to urinate.  

## 2024-01-03 NOTE — ED Notes (Signed)
[name] (daughter); called for an update [phone]
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
## 2024-01-04 ENCOUNTER — Other Ambulatory Visit (HOSPITAL_COMMUNITY): Payer: Self-pay

## 2024-01-04 DIAGNOSIS — J441 Chronic obstructive pulmonary disease with (acute) exacerbation: Secondary | ICD-10-CM | POA: Diagnosis not present

## 2024-01-04 LAB — PHOSPHORUS: Phosphorus: 4.4 mg/dL (ref 2.5–4.6)

## 2024-01-04 MED ORDER — AZITHROMYCIN 250 MG PO TABS
250.0000 mg | ORAL_TABLET | Freq: Every day | ORAL | 0 refills | Status: AC
  Filled 2024-01-04: qty 4, 4d supply, fill #0

## 2024-01-04 MED ORDER — PREDNISONE 50 MG PO TABS
50.0000 mg | ORAL_TABLET | Freq: Every day | ORAL | 0 refills | Status: AC
  Filled 2024-01-04: qty 4, 4d supply, fill #0

## 2024-01-04 NOTE — Discharge Summary (Signed)
 Physician Discharge Summary  [name] AVW:[id number] DOB: [birth year] DOA: 01/03/2024  PCP: [name], DO  Admit date: 01/03/2024 Discharge date: 01/04/2024 30 Day Unplanned Readmission Risk Score    Flowsheet Row ED to Hosp-Admission (Current) from 01/03/2024 in [facility]  SURGICAL  30 Day Unplanned Readmission Risk Score (%) 16.17 Filed at 01/04/2024 0800       This score is the patient's risk of an unplanned readmission within 30 days of being discharged (0 -100%). The score is based on dignosis, age, lab data, medications, orders, and past utilization.   Low:  0-14.9   Medium: 15-21.9   High: 22-29.9   Extreme: 30 and above          Admitted From: Home Disposition: Home  Recommendations for Outpatient Follow-up:  Follow up with PCP in 1-2 weeks Please obtain BMP/CBC in one week Please follow up with your PCP on the following pending results: Unresulted Labs (From admission, onward)     Start     Ordered   01/04/24 0500  Phosphorus  Daily,   R      01/03/24 0820              Home Health: None Equipment/Devices: None  Discharge Condition: Stable CODE STATUS: Full code Diet recommendation: Cardiac  Subjective: Seen and examined, patient says that she is feeling better than yesterday and she does not feel any far from her baseline.  She says that she is always busy because she still smokes.  She has fine tremors in the extremities which he also says that are chronic.  She would prefer to go home today.  Brief/Interim Summary: This is a pleasant 53 year old lady with history of severe COPD, chronic hypoxic respiratory requires 2 L of oxygen continuous nasal cannula, hypothyroidism and many other comorbidities who presented to ED with shortness of breath, tested positive for influenza A and admitted under hospital service for acute COPD exacerbation secondary to influenza A.  She was started on antibiotics, steroids and bronchodilators.   Tamiflu was not started since she was out of the window, symptoms since 4 days.  Patient overall has improved, still wheezes on examination but better than yesterday, no rhonchi.  No signs of bacterial infection.  Patient says that she is close to her baseline.  Of note, patient was found to be using illegal substances in the room yesterday, her belongings were confiscated and ever since, patient has been belligerent and partly uncooperative with the staff.  We checked her UDS which was positive for cocaine, benzodiazepines and THC.  I wonder if she is cramping and wants to go home and use those substances again.  I personally tried to talk to her, she denied using any substances.  Regardless, I counseled her about staying away and not using those substances.  Patient is on 2 L of oxygen which is her baseline.  She is being discharged home.  Will prescribe her 4 more days of oral azithromycin and prednisone.  Also, per pharmacy, she has not been prescribed benzodiazepines from anywhere.  I suspect that she is likely buying from the street.  She requested me to prescribe her benzodiazepines.  I respectfully declined her request due to history of abuse.  Also, she came in with hypophosphatemia which was replenished and phosphate is normal today.  She also has generalized anxiety disorder.  She will resume her home medications.  Discharge Diagnoses:  Principal Problem:   Acute exacerbation of chronic obstructive  pulmonary disease (COPD) (HCC) Active Problems:   Acquired hypothyroidism   GAD (generalized anxiety disorder)   Allergic rhinitis   SOB (shortness of breath)   Chronic hypoxic respiratory failure (HCC)   Influenza A   Hypophosphatemia    Discharge Instructions   Allergies as of 01/04/2024       Reactions   Duragesic-100 [fentanyl] Nausea And Vomiting   Hydrocodone Nausea And Vomiting        Medication List     TAKE these medications    azithromycin 250 MG tablet Commonly known  as: Zithromax Z-Pak Take 1 tablet (250 mg total) by mouth daily for 4 days.   Breztri Aerosphere 160-9-4.8 MCG/ACT Aero Generic drug: Budeson-Glycopyrrol-Formoterol INHALE 2 PUFFS INTO THE LUNGS IN THE MORNING AND AT BEDTIME   fluticasone 50 MCG/ACT nasal spray Commonly known as: FLONASE Place 2 sprays into both nostrils daily.   levothyroxine 125 MCG tablet Commonly known as: SYNTHROID Take 1 tablet (125 mcg total) by mouth daily.   predniSONE 50 MG tablet Commonly known as: DELTASONE Take 1 tablet (50 mg total) by mouth daily with breakfast for 4 days.   Ventolin HFA 108 (90 Base) MCG/ACT inhaler Generic drug: albuterol INHALE 2 PUFFS INTO THE LUNGS EVERY 6 HOURS AS NEEDED FOR WHEEZING OR SHORTNESS OF BREATH   albuterol (2.5 MG/3ML) 0.083% nebulizer solution Commonly known as: PROVENTIL USE 2 VIAL VIA NEBULIZER EVERY 4 HOURS AS NEEDED FOR WHEEZING, SHORTNESS OF BREATH        Follow-up Information     [name], DO Follow up in 1 week(s).   Specialty: Internal Medicine Contact information: [address] [place] [address] [phone]                Allergies  Allergen Reactions   Duragesic-100 [Fentanyl] Nausea And Vomiting   Hydrocodone Nausea And Vomiting    Consultations: None   Procedures/Studies: DG Chest Portable 1 View Result Date: 01/03/2024 CLINICAL DATA:  Shortness of breath EXAM: PORTABLE CHEST 1 VIEW COMPARISON:  10/04/2023 FINDINGS: Heart and mediastinal contours are within normal limits. No focal opacities or effusions. No acute bony abnormality. IMPRESSION: No active disease. Electronically Signed   By: Charlett Nose M.D.   On: 01/03/2024 02:39     Discharge Exam: Vitals:   01/04/24 0743 01/04/24 1019  BP: (!) 158/98   Pulse: 94   Resp: 17   Temp: 98.4 F (36.9 C)   SpO2: 97% 99%   Vitals:   01/04/24 0500 01/04/24 0524 01/04/24 0743 01/04/24 1019  BP:  (!) 156/106 (!) 158/98   Pulse:  96 94   Resp:  18 17   Temp:   98.2 F (36.8 C) 98.4 F (36.9 C)   TempSrc:  Oral Oral   SpO2:  98% 97% 99%  Weight: 48.8 kg     Height:        General: Pt is alert, awake, not in acute distress Cardiovascular: RRR, S1/S2 +, no rubs, no gallops Respiratory: Expiratory wheezes, no crackles, no rhonchi Abdominal: Soft, NT, ND, bowel sounds + Extremities: no edema, no cyanosis    The results of significant diagnostics from this hospitalization (including imaging, microbiology, ancillary and laboratory) are listed below for reference.     Microbiology: Recent Results (from the past 240 hours)  Resp panel by RT-PCR (RSV, Flu A&B, Covid) Anterior Nasal Swab     Status: Abnormal   Collection Time: 01/03/24  2:15 AM   Specimen: Anterior Nasal Swab  Result  Value Ref Range Status   SARS Coronavirus 2 by RT PCR NEGATIVE NEGATIVE Final   Influenza A by PCR POSITIVE (A) NEGATIVE Final   Influenza B by PCR NEGATIVE NEGATIVE Final    Comment: (NOTE) The Xpert Xpress SARS-CoV-2/FLU/RSV plus assay is intended as an aid in the diagnosis of influenza from Nasopharyngeal swab specimens and should not be used as a sole basis for treatment. Nasal washings and aspirates are unacceptable for Xpert Xpress SARS-CoV-2/FLU/RSV testing.  Fact Sheet for Patients: BloggerCourse.com  Fact Sheet for Healthcare Providers: SeriousBroker.it  This test is not yet approved or cleared by the [place] FDA and has been authorized for detection and/or diagnosis of SARS-CoV-2 by FDA under an Emergency Use Authorization (EUA). This EUA will remain in effect (meaning this test can be used) for the duration of the COVID-19 declaration under Section 564(b)(1) of the Act, 21 U.S.C. section 360bbb-3(b)(1), unless the authorization is terminated or revoked.     Resp Syncytial Virus by PCR NEGATIVE NEGATIVE Final    Comment: (NOTE) Fact Sheet for  Patients: BloggerCourse.com  Fact Sheet for Healthcare Providers: SeriousBroker.it  This test is not yet approved or cleared by the [place] FDA and has been authorized for detection and/or diagnosis of SARS-CoV-2 by FDA under an Emergency Use Authorization (EUA). This EUA will remain in effect (meaning this test can be used) for the duration of the COVID-19 declaration under Section 564(b)(1) of the Act, 21 U.S.C. section 360bbb-3(b)(1), unless the authorization is terminated or revoked.  Performed at [facility], [address] [place], [place] [address]      Labs: BNP (last 3 results) Recent Labs    03/08/23 1110 01/03/24 0214  BNP 166.6* 35.9   Basic Metabolic Panel: Recent Labs  Lab 01/03/24 0214 01/03/24 0431 01/04/24 0430  NA 136  --   --   K 3.7  --   --   CL 103  --   --   CO2 22  --   --   GLUCOSE 107*  --   --   BUN 11  --   --   CREATININE 0.69  --   --   CALCIUM 9.3  --   --   MG  --  1.8  --   PHOS  --  1.9* 4.4   Liver Function Tests: Recent Labs  Lab 01/03/24 0214  AST 23  ALT 18  ALKPHOS 45  BILITOT 0.5  PROT 6.6  ALBUMIN 3.6   No results for input(s): "LIPASE", "AMYLASE" in the last 168 hours. No results for input(s): "AMMONIA" in the last 168 hours. CBC: Recent Labs  Lab 01/03/24 0214  WBC 7.8  NEUTROABS 7.0  HGB 14.3  HCT 43.6  MCV 92.4  PLT 217   Cardiac Enzymes: No results for input(s): "CKTOTAL", "CKMB", "CKMBINDEX", "TROPONINI" in the last 168 hours. BNP: Invalid input(s): "POCBNP" CBG: No results for input(s): "GLUCAP" in the last 168 hours. D-Dimer No results for input(s): "DDIMER" in the last 72 hours. Hgb A1c No results for input(s): "HGBA1C" in the last 72 hours. Lipid Profile No results for input(s): "CHOL", "HDL", "LDLCALC", "TRIG", "CHOLHDL", "LDLDIRECT" in the last 72 hours. Thyroid function studies No results for input(s): "TSH", "T4TOTAL",  "T3FREE", "THYROIDAB" in the last 72 hours.  Invalid input(s): "FREET3" Anemia work up No results for input(s): "VITAMINB12", "FOLATE", "FERRITIN", "TIBC", "IRON", "RETICCTPCT" in the last 72 hours. Urinalysis    Component Value Date/Time   COLORURINE YELLOW 03/08/2023  1350   APPEARANCEUR CLEAR 03/08/2023 1350   LABSPEC 1.011 03/08/2023 1350   PHURINE 6.0 03/08/2023 1350   GLUCOSEU NEGATIVE 03/08/2023 1350   HGBUR NEGATIVE 03/08/2023 1350   BILIRUBINUR NEGATIVE 03/08/2023 1350   KETONESUR NEGATIVE 03/08/2023 1350   PROTEINUR NEGATIVE 03/08/2023 1350   UROBILINOGEN 0.2 01/16/2012 0240   NITRITE NEGATIVE 03/08/2023 1350   LEUKOCYTESUR NEGATIVE 03/08/2023 1350   Sepsis Labs Recent Labs  Lab 01/03/24 0214  WBC 7.8   Microbiology Recent Results (from the past 240 hours)  Resp panel by RT-PCR (RSV, Flu A&B, Covid) Anterior Nasal Swab     Status: Abnormal   Collection Time: 01/03/24  2:15 AM   Specimen: Anterior Nasal Swab  Result Value Ref Range Status   SARS Coronavirus 2 by RT PCR NEGATIVE NEGATIVE Final   Influenza A by PCR POSITIVE (A) NEGATIVE Final   Influenza B by PCR NEGATIVE NEGATIVE Final    Comment: (NOTE) The Xpert Xpress SARS-CoV-2/FLU/RSV plus assay is intended as an aid in the diagnosis of influenza from Nasopharyngeal swab specimens and should not be used as a sole basis for treatment. Nasal washings and aspirates are unacceptable for Xpert Xpress SARS-CoV-2/FLU/RSV testing.  Fact Sheet for Patients: BloggerCourse.com  Fact Sheet for Healthcare Providers: SeriousBroker.it  This test is not yet approved or cleared by the [place] FDA and has been authorized for detection and/or diagnosis of SARS-CoV-2 by FDA under an Emergency Use Authorization (EUA). This EUA will remain in effect (meaning this test can be used) for the duration of the COVID-19 declaration under Section 564(b)(1) of the Act, 21  U.S.C. section 360bbb-3(b)(1), unless the authorization is terminated or revoked.     Resp Syncytial Virus by PCR NEGATIVE NEGATIVE Final    Comment: (NOTE) Fact Sheet for Patients: BloggerCourse.com  Fact Sheet for Healthcare Providers: SeriousBroker.it  This test is not yet approved or cleared by the [place] FDA and has been authorized for detection and/or diagnosis of SARS-CoV-2 by FDA under an Emergency Use Authorization (EUA). This EUA will remain in effect (meaning this test can be used) for the duration of the COVID-19 declaration under Section 564(b)(1) of the Act, 21 U.S.C. section 360bbb-3(b)(1), unless the authorization is terminated or revoked.  Performed at [facility], [address], [place], [place] [address]     FURTHER DISCHARGE INSTRUCTIONS:   Get Medicines reviewed and adjusted: Please take all your medications with you for your next visit with your Primary MD   Laboratory/radiological data: Please request your Primary MD to go over all hospital tests and procedure/radiological results at the follow up, please ask your Primary MD to get all Hospital records sent to his/her office.   In some cases, they will be blood work, cultures and biopsy results pending at the time of your discharge. Please request that your primary care M.D. goes through all the records of your hospital data and follows up on these results.   Also Note the following: If you experience worsening of your admission symptoms, develop shortness of breath, life threatening emergency, suicidal or homicidal thoughts you must seek medical attention immediately by calling 911 or calling your MD immediately  if symptoms less severe.   You must read complete instructions/literature along with all the possible adverse reactions/side effects for all the Medicines you take and that have been prescribed to you. Take any new Medicines after  you have completely understood and accpet all the possible adverse reactions/side effects.    Do  not drive when taking Pain medications or sleeping medications (Benzodaizepines)   Do not take more than prescribed Pain, Sleep and Anxiety Medications. It is not advisable to combine anxiety,sleep and pain medications without talking with your primary care practitioner   Special Instructions: If you have smoked or chewed Tobacco  in the last 2 yrs please stop smoking, stop any regular Alcohol  and or any Recreational drug use.   Wear Seat belts while driving.   Please note: You were cared for by a hospitalist during your hospital stay. Once you are discharged, your primary care physician will handle any further medical issues. Please note that NO REFILLS for any discharge medications will be authorized once you are discharged, as it is imperative that you return to your primary care physician (or establish a relationship with a primary care physician if you do not have one) for your post hospital discharge needs so that they can reassess your need for medications and monitor your lab values  Time coordinating discharge: Over 30 minutes  SIGNED:   Hughie Closs, MD  [facility] 01/04/2024, 11:26 AM *Please note that this is a verbal dictation therefore any spelling or grammatical errors are due to the "Dragon Medical One" system interpretation. If 7PM-7AM, please contact night-coverage [email or web address]

## 2024-01-04 NOTE — Plan of Care (Signed)
 A/O x4 , VS stable. Anxious to get home. MD at the bedside, all questions are answered, and Discharge orders are in.

## 2024-01-04 NOTE — Progress Notes (Signed)
 [facility] Beckey Rutter NP informed patient c/o of nausea no vomiting

## 2024-01-04 NOTE — Progress Notes (Signed)
 AVS printed and provided to the pt. All questions are answered. Medications taken from pharmacy. All belongings, including pocket book is with pt. Pt was transfer out, connected to Home O2 and Dc home with daughter.

## 2024-01-04 NOTE — Progress Notes (Signed)
 Liana Crocker NP [facility] informed that cardiac monitor will expire in 3 hours.

## 2024-01-04 NOTE — Plan of Care (Signed)

## 2024-01-10 ENCOUNTER — Telehealth: Payer: Self-pay | Admitting: Internal Medicine

## 2024-01-10 NOTE — Telephone Encounter (Signed)
 Tried to call patient her v, was full and couldn't leave a message

## 2024-01-10 NOTE — Telephone Encounter (Signed)
 Patient was released from the hospital last week and only given 4 pills of prednisone and 4 pills of antibiotics. She has the flu and pneumonia in the left lung. She would like for more antibiotics to be called in.She also needs a refill of her albuterol solution .she can be reached at  [phone]  Pharmacy: [facility] on [address]

## 2024-01-13 ENCOUNTER — Encounter: Payer: Self-pay | Admitting: Internal Medicine

## 2024-01-13 ENCOUNTER — Ambulatory Visit: Admitting: Internal Medicine

## 2024-01-13 VITALS — BP 138/88 | HR 92 | Ht 60.0 in | Wt 99.0 lb

## 2024-01-13 DIAGNOSIS — J449 Chronic obstructive pulmonary disease, unspecified: Secondary | ICD-10-CM | POA: Diagnosis not present

## 2024-01-13 DIAGNOSIS — F17210 Nicotine dependence, cigarettes, uncomplicated: Secondary | ICD-10-CM | POA: Diagnosis not present

## 2024-01-13 DIAGNOSIS — J9611 Chronic respiratory failure with hypoxia: Secondary | ICD-10-CM

## 2024-01-13 MED ORDER — PREDNISONE 10 MG PO TABS: ORAL_TABLET | ORAL | 0 refills | Status: DC

## 2024-01-13 MED ORDER — SPIRIVA RESPIMAT 2.5 MCG/ACT IN AERS: INHALATION_SPRAY | RESPIRATORY_TRACT | 11 refills | Status: DC

## 2024-01-13 MED ORDER — AZITHROMYCIN 250 MG PO TABS: ORAL_TABLET | ORAL | 0 refills | Status: DC

## 2024-01-13 MED ORDER — METHYLPREDNISOLONE ACETATE 80 MG/ML IJ SUSP
120.0000 mg | Freq: Once | INTRAMUSCULAR | Status: AC
  Administered 2024-01-13: 120 mg via INTRAMUSCULAR

## 2024-01-13 MED ORDER — BUDESONIDE-FORMOTEROL FUMARATE 160-4.5 MCG/ACT IN AERO: INHALATION_SPRAY | RESPIRATORY_TRACT | 12 refills | Status: DC

## 2024-01-13 MED ORDER — BREZTRI AEROSPHERE 160-9-4.8 MCG/ACT IN AERO: 2.0000 | INHALATION_SPRAY | Freq: Two times a day (BID) | RESPIRATORY_TRACT | Status: DC

## 2024-01-13 NOTE — Telephone Encounter (Signed)
 Spoke to patient. She reports of increased SOB.  She feels that she needs prednisone and abx.  Acute visit scheduled for today at 1:30 with Dr. Sherene Sires. Pt aware of [address].  Nothing further needed.

## 2024-01-13 NOTE — Patient Instructions (Addendum)
 Plan A = Automatic = Always=     Breztri as you were until you start the Symbicort 160 Take 2 puffs first thing in am and then another 2 puffs about 12 hours later with spiriva 2 puffs each am  Work on inhaler technique:  relax and gently blow all the way out then take a nice smooth full deep breath back in, triggering the inhaler at same time you start breathing in.  Hold breath in for at least  5 seconds if you can. Blow out breztri/symbicort  thru nose. Rinse and gargle with water when done.  If mouth or throat bother you at all,  try brushing teeth/gums/tongue with arm and hammer toothpaste/ make a slurry and gargle and spit out.      Plan B = Backup (to supplement plan A, not to replace it) Only use your albuterol inhaler as a rescue medication to be used if you can't catch your breath by resting or doing a relaxed purse lip breathing pattern.  - The less you use it, the better it will work when you need it. - Ok to use the inhaler up to 2 puffs  every 4 hours if you must but call for appointment if use goes up over your usual need - Don't leave home without it !!  (think of it like the spare tire for your car)   Plan C = Crisis (instead of Plan B but only if Plan B stops working) - only use your albuterol nebulizer if you first try Plan B and it fails to help > ok to use the nebulizer up to every 4 hours but if start needing it regularly call for immediate appointment   Prednisone 10 mg take  4 each am x 2 days,   2 each am x 2 days,  1 each am x 2 days and stop   Zpak   Depomedrol 120 mg IM   Make sure you check your oxygen saturation  AT  your highest level of activity (not after you stop)   to be sure it stays over 90% and adjust  02 flow upward to maintain this level if needed but remember to turn it back to previous settings when you stop (to conserve your supply).  The key is to stop smoking completely before smoking completely stops you!  Keep previous point -  to ER in meantime  if getting worse
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
## 2024-01-14 NOTE — Assessment & Plan Note (Addendum)
 Counseled re importance of smoking cessation but did not meet time criteria for separate billing    Each maintenance medication was reviewed in detail including emphasizing most importantly the difference between maintenance and prns and under what circumstances the prns are to be triggered using an action plan format where appropriate.  Total time for H and P, chart review, counseling, reviewing hfa/ smi/ neb/ 02/ pulse ox/flutter  device(s) and generating customized AVS unique to this acute  office visit / same day charting = 30 min

## 2024-01-14 NOTE — Assessment & Plan Note (Addendum)
 Active smoker -  Spirometry 09/18/2018  FEV1 1.1 (44%)  Ratio 49 with classic curvature p am advair/spiriva dpi's - 09/18/2018  After extensive coaching inhaler device,  effectiveness =    75% with smi so try change to stiolto with pred x 6 d as "plan D"  > not doing as of 10/19/2019  - 05/24/2023  After extensive coaching inhaler device,  effectiveness =    75% with hfa > continue breztri /flutter valve and approp saba   >>>  01/13/2024  After extensive coaching inhaler device,  effectiveness =     75%  with hfa   and smi> changed to symb 160 / spiriva 2.5 due to insurance not covering breztri   DDX of  difficult airways management almost all start with A and  include Adherence, Ace Inhibitors, Acid Reflux, Active Sinus Disease, Alpha 1 Antitripsin deficiency, Anxiety masquerading as Airways dz,  ABPA,  Allergy(esp in young), Aspiration (esp in elderly), Adverse effects of meds,  Active smoking or vaping, A bunch of PE's (a small clot burden can't cause this syndrome unless there is already severe underlying pulm or vascular dz with poor reserve) plus two Bs  = Bronchiectasis and Beta blocker use..and one C= CHF   Adherence is always the initial "prime suspect" and is a multilayered concern that requires a "trust but verify" approach in every patient - starting with knowing how to use medications, especially inhalers, correctly, keeping up with refills and understanding the fundamental difference between maintenance and prns vs those medications only taken for a very short course and then stopped and not refilled.  - see hfa teaching  - return with all meds in hand using a trust but verify approach to confirm accurate Medication  Reconciliation The principal here is that until we are certain that the  patients are doing what we've asked, it makes no sense to ask them to do more.   Active smoking > see sep a/p   Allergy/ asthma > pred x 6 d, high dose ICS   ? Anxiety > usually at the bottom of this list  of usual suspects but should be much higher on this pt's based on H and P and note already on psychotropics and may interfere with adherence and also interpretation of response or lack thereof to symptom management which can be quite subjective.   ? Active sinus dz/ rhinitis > doubt, so rx zpak probably adequate for now.  ? Bronchiectasis  > mucinex/ flutter reviewed

## 2024-01-14 NOTE — Assessment & Plan Note (Signed)
 Adequate control on present rx, reviewed in detail with pt > no change in rx needed    Reminded: Make sure you check your oxygen saturation  AT  your highest level of activity (not after you stop)   to be sure it stays over 90% and adjust  02 flow upward to maintain this level if needed but remember to turn it back to previous settings when you stop (to conserve your supply).

## 2024-01-16 NOTE — Telephone Encounter (Unsigned)
 Copied from CRM [id number]. Topic: Clinical - Medication Refill >> Jan 16, 2024  3:44 PM Maxwell Marion wrote: Patient wants to let her provider know that she has virtual appointment scheduled for March 20 with a therapist and intake appointment with psychiatry on April 17th. Wants to know if Lorazepam can be filled until her appointment. Says she is also running low on Zoloft and also mentioned wanting to know if provider can write her a prescription for Remus Loffler because they gave it to her at the hospital and it helped her sleep. Would like a call back.

## 2024-01-21 MED ORDER — SERTRALINE HCL 50 MG PO TABS: 50.0000 mg | ORAL_TABLET | Freq: Every day | ORAL | 2 refills | Status: DC

## 2024-01-23 NOTE — Telephone Encounter (Signed)
 Pt has upcoming appt with Dr. Sherrilee Gilles on 02/25/2024.

## 2024-01-30 ENCOUNTER — Ambulatory Visit (HOSPITAL_COMMUNITY): Payer: Medicaid Other | Admitting: Licensed Clinical Social Worker
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
## 2024-01-31 ENCOUNTER — Ambulatory Visit: Payer: Medicaid Other | Admitting: Internal Medicine

## 2024-02-02 ENCOUNTER — Other Ambulatory Visit: Payer: Self-pay

## 2024-02-02 ENCOUNTER — Emergency Department (HOSPITAL_COMMUNITY)
Admission: EM | Admit: 2024-02-02 | Discharge: 2024-02-02 | Disposition: A | Discharge status: Home / Self Care | Attending: Emergency Medicine | Admitting: Emergency Medicine

## 2024-02-02 ENCOUNTER — Emergency Department (HOSPITAL_COMMUNITY)

## 2024-02-02 DIAGNOSIS — Z79899 Other long term (current) drug therapy: Secondary | ICD-10-CM | POA: Insufficient documentation

## 2024-02-02 DIAGNOSIS — T402X1A Poisoning by other opioids, accidental (unintentional), initial encounter: Secondary | ICD-10-CM | POA: Insufficient documentation

## 2024-02-02 DIAGNOSIS — J449 Chronic obstructive pulmonary disease, unspecified: Secondary | ICD-10-CM | POA: Insufficient documentation

## 2024-02-02 DIAGNOSIS — T6591XA Toxic effect of unspecified substance, accidental (unintentional), initial encounter: Secondary | ICD-10-CM | POA: Diagnosis present

## 2024-02-02 LAB — CBC WITH DIFFERENTIAL/PLATELET
Abs Immature Granulocytes: 0.02 10*3/uL (ref 0.00–0.07)
Basophils Absolute: 0.1 10*3/uL (ref 0.0–0.1)
Basophils Relative: 1 %
Eosinophils Absolute: 0.1 10*3/uL (ref 0.0–0.5)
Eosinophils Relative: 2 %
HCT: 43.7 % (ref 36.0–46.0)
Hemoglobin: 14.5 g/dL (ref 12.0–15.0)
Immature Granulocytes: 0 %
Lymphocytes Relative: 32 %
Lymphs Abs: 1.9 10*3/uL (ref 0.7–4.0)
MCH: 30.5 pg (ref 26.0–34.0)
MCHC: 33.2 g/dL (ref 30.0–36.0)
MCV: 91.8 fL (ref 80.0–100.0)
Monocytes Absolute: 0.5 10*3/uL (ref 0.1–1.0)
Monocytes Relative: 8 %
Neutro Abs: 3.4 10*3/uL (ref 1.7–7.7)
Neutrophils Relative %: 57 %
Platelets: 228 10*3/uL (ref 150–400)
RBC: 4.76 MIL/uL (ref 3.87–5.11)
RDW: 13 % (ref 11.5–15.5)
WBC: 6 10*3/uL (ref 4.0–10.5)
nRBC: 0 % (ref 0.0–0.2)

## 2024-02-02 LAB — COMPREHENSIVE METABOLIC PANEL
ALT: 23 U/L (ref 0–44)
AST: 28 U/L (ref 15–41)
Albumin: 4.1 g/dL (ref 3.5–5.0)
Alkaline Phosphatase: 53 U/L (ref 38–126)
Anion gap: 9 (ref 5–15)
BUN: 9 mg/dL (ref 6–20)
CO2: 30 mmol/L (ref 22–32)
Calcium: 9.3 mg/dL (ref 8.9–10.3)
Chloride: 103 mmol/L (ref 98–111)
Creatinine, Ser: 0.8 mg/dL (ref 0.44–1.00)
GFR, Estimated: >60 mL/min (ref >60)
Glucose, Bld: 103 mg/dL — ABNORMAL HIGH (ref 70–99)
Potassium: 3.6 mmol/L (ref 3.5–5.1)
Sodium: 142 mmol/L (ref 135–145)
Total Bilirubin: 0.7 mg/dL (ref 0.0–1.2)
Total Protein: 7.3 g/dL (ref 6.5–8.1)

## 2024-02-02 LAB — ETHANOL: Alcohol, Ethyl (B): <10 mg/dL (ref <10)

## 2024-02-02 NOTE — ED Notes (Signed)
 Pt spo2 at 88 on room air, placed on 2L o2. RN [employer] notified

## 2024-02-02 NOTE — ED Triage Notes (Signed)
 Patient arrived with EMS from home , patient stated that she snorted an unknown drug this morning , denies SI or HI , history of copd , somnolent at arrival . Received 0.8 mg IM Narcan prior to arrival .

## 2024-02-02 NOTE — ED Provider Notes (Signed)
 [place] EMERGENCY DEPARTMENT AT [facility] Provider Note   CSN: [id number] Arrival date & time: 02/02/24  0406     History  Chief Complaint  Patient presents with   Snorted Drug    [name] is a 53 y.o. female.  The history is provided by the patient and medical records.   53 year old female with history of COPD on 2 L home oxygen, substance abuse, Graves' disease, chronic pain, presenting to the ED after suspected overdose.  Patient states she was given what she thought was a Percocet from her neighbor, crushed it up and snorted it.  Apparently shortly after she went unresponsive.  She was given Narcan prior to arrival.  Currently she is awake, alert, able to answer questions.  States she is very upset with herself for doing this.  She denies any SI/HI.  Home Medications Prior to Admission medications   Medication Sig Start Date End Date Taking? Authorizing Provider  albuterol (PROVENTIL) (2.5 MG/3ML) 0.083% nebulizer solution USE 2 VIAL VIA NEBULIZER EVERY 4 HOURS AS NEEDED FOR WHEEZING, SHORTNESS OF BREATH 12/31/23   Cobb, Ruby Cola, NP  azithromycin (ZITHROMAX) 250 MG tablet Take 2 on day one then 1 daily x 4 days 01/13/24   [name], MD  BREZTRI AEROSPHERE 160-9-4.8 MCG/ACT AERO INHALE 2 PUFFS INTO THE LUNGS IN THE MORNING AND AT BEDTIME 11/26/23   [name], MD  budesonide-formoterol (SYMBICORT) 160-4.5 MCG/ACT inhaler Take 2 puffs first thing in am and then another 2 puffs about 12 hours later. 01/13/24   [name], MD  fluticasone (FLONASE) 50 MCG/ACT nasal spray Place 2 sprays into both nostrils daily. 10/31/23   [name], DO  levothyroxine (SYNTHROID) 125 MCG tablet Take 1 tablet (125 mcg total) by mouth daily. 08/12/23   Tawkaliyar, Roya, DO  predniSONE (DELTASONE) 10 MG tablet Take  4 each am x 2 days,   2 each am x 2 days,  1 each am x 2 days and stop 01/13/24   [name], MD  sertraline (ZOLOFT) 50 MG tablet Take 1 tablet (50 mg  total) by mouth daily. 01/21/24   [name], DO  Tiotropium Bromide Monohydrate (SPIRIVA RESPIMAT) 2.5 MCG/ACT AERS 2 puffs each  am 01/13/24   [name], MD  VENTOLIN HFA 108 (90 Base) MCG/ACT inhaler INHALE 2 PUFFS INTO THE LUNGS EVERY 6 HOURS AS NEEDED FOR WHEEZING OR SHORTNESS OF BREATH 12/12/23   [name], DO  dicyclomine (BENTYL) 20 MG tablet Take 1 tablet (20 mg total) by mouth every 12 (twelve) hours as needed for spasms (abdominal pain/cramping). 08/28/18 03/08/20  Antony Madura, PA-C  promethazine (PHENERGAN) 25 MG tablet Take 1 tablet (25 mg total) by mouth every 6 (six) hours as needed for nausea or vomiting. 08/28/18 03/08/20  Antony Madura, PA-C  sucralfate (CARAFATE) 1 GM/10ML suspension Take 10 mLs (1 g total) by mouth 4 (four) times daily -  with meals and at bedtime. 08/07/18 03/08/20  Howard Pouch, MD      Allergies    Duragesic-100 [fentanyl] and Hydrocodone    Review of Systems   Review of Systems  Constitutional:        OD  All other systems reviewed and are negative.   Physical Exam Updated Vital Signs BP (!) 129/91 (BP Location: Left Arm)   Pulse 74   Temp 98.2 F (36.8 C) (Oral)   Resp 16   SpO2 94%   Physical Exam Vitals and nursing  note reviewed.  Constitutional:      Appearance: She is well-developed.  HENT:     Head: Normocephalic and atraumatic.  Eyes:     Conjunctiva/sclera: Conjunctivae normal.     Pupils: Pupils are equal, round, and reactive to light.  Cardiovascular:     Rate and Rhythm: Normal rate and regular rhythm.     Heart sounds: Normal heart sounds.  Pulmonary:     Effort: Pulmonary effort is normal.     Breath sounds: Normal breath sounds.     Comments: 2L O2 in use Abdominal:     General: Bowel sounds are normal.     Palpations: Abdomen is soft.  Musculoskeletal:        General: Normal range of motion.     Cervical back: Normal range of motion.  Skin:    General: Skin is warm and dry.  Neurological:     Mental  Status: She is alert and oriented to person, place, and time.     ED Results / Procedures / Treatments   Labs (all labs ordered are listed, but only abnormal results are displayed) Labs Reviewed  COMPREHENSIVE METABOLIC PANEL - Abnormal; Notable for the following components:      Result Value   Glucose, Bld 103 (*)    All other components within normal limits  CBC WITH DIFFERENTIAL/PLATELET  ETHANOL  RAPID URINE DRUG SCREEN, HOSP PERFORMED    EKG EKG Interpretation Date/Time:  Sunday February 02 2024 04:40:41 EDT Ventricular Rate:  64 PR Interval:  132 QRS Duration:  82 QT Interval:  398 QTC Calculation: 410 R Axis:   78  Text Interpretation: Normal sinus rhythm Normal ECG When compared with ECG of 03-Jan-2024 01:58, PREVIOUS ECG IS PRESENT No significant change was found Confirmed by Glynn Octave [phone]) on 02/02/2024 4:47:50 AM  Radiology DG Chest 2 View Result Date: 02/02/2024 CLINICAL DATA:  Overdose. EXAM: CHEST - 2 VIEW COMPARISON:  01/03/2024 FINDINGS: Lungs are hyperexpanded. No pulmonary edema or pleural effusion. Linear density at the right base is probably atelectatic although pneumonia not excluded. No pneumothorax. The cardiopericardial silhouette is within normal limits for size. No acute bony abnormality. Bones are diffusely demineralized. IMPRESSION: Hyperexpansion with chronic interstitial coarsening. Linear density in the infrahilar right base is likely atelectatic although pneumonia cannot be excluded. Electronically Signed   By: Kennith Center M.D.   On: 02/02/2024 05:56    Procedures Procedures    CRITICAL CARE Performed by: Garlon Hatchet   Total critical care time: 35 minutes  Critical care time was exclusive of separately billable procedures and treating other patients.  Critical care was necessary to treat or prevent imminent or life-threatening deterioration.  Critical care was time spent personally by me on the following activities:  development of treatment plan with patient and/or surrogate as well as nursing, discussions with consultants, evaluation of patient's response to treatment, examination of patient, obtaining history from patient or surrogate, ordering and performing treatments and interventions, ordering and review of laboratory studies, ordering and review of radiographic studies, pulse oximetry and re-evaluation of patient's condition.   Medications Ordered in ED Medications - No data to display  ED Course/ Medical Decision Making/ A&P                                 Medical Decision Making Amount and/or Complexity of Data Reviewed Labs: ordered. Radiology: ordered and independent interpretation performed. ECG/medicine tests: ordered  and independent interpretation performed.   53 year old female presenting to the ED after snorting what she thought was a Percocet from a neighbor.  Apparently became unresponsive and received Narcan prior to arrival.  She is awake, alert, oriented on arrival.  She denies any attempted self-harm, states she is quite upset with herself for doing this.  She is on her baseline 2 L oxygen.  She denies any other Co. ingestion.  EKG is sinus rhythm.  Will obtain screening labs, UDS, chest x-ray.  Labs as above--no leukocytosis or electrolyte derangement.  Ethanol is negative.  UDS is still pending.  Chest x-ray with hyperinflation, appears to be chronic related to her COPD.  6:26 AM Patient reassessed.  Remains HD stable on her baseline 2L.  She is tolerating oral fluids currently, still getting intermittently somnolent but not hypoxic or apneic.  Will monitor until around 8am and if still stable and not requiring further narcan I feel she can be discharged.  She is agreeable.  Still adamantly denies intended self harm.  Final Clinical Impression(s) / ED Diagnoses Final diagnoses:  Ingestion of substance, accidental or unintentional, initial encounter    Rx / DC Orders ED  Discharge Orders     None         Garlon Hatchet, PA-C 02/02/24 1610    Glynn Octave, MD 02/02/24 [id number]

## 2024-02-02 NOTE — Discharge Instructions (Addendum)
 Refrain from using illicit substances or medications not belonging to you. Follow-up with your doctor. Return here for new concerns.

## 2024-02-02 NOTE — ED Notes (Signed)
 Patient transported to X-ray

## 2024-02-11 ENCOUNTER — Other Ambulatory Visit: Payer: Self-pay | Admitting: Student

## 2024-02-11 DIAGNOSIS — J449 Chronic obstructive pulmonary disease, unspecified: Secondary | ICD-10-CM

## 2024-02-11 NOTE — Telephone Encounter (Signed)
 Copied from CRM [id number]. Topic: Clinical - Medication Refill >> Feb 11, 2024  4:14 PM Brennan Bailey S wrote: Most Recent Primary Care Visit:  Provider: [name]  Department: [facility]  Visit Type: OPEN ESTABLISHED  Date: 10/31/2023  Medication: budesonide-formoterol (SYMBICORT)  Has the patient contacted their pharmacy? Yes (Agent: If no, request that the patient contact the pharmacy for the refill. If patient does not wish to contact the pharmacy document the reason why and proceed with request.) (Agent: If yes, when and what did the pharmacy advise?)  Is this the correct pharmacy for this prescription? Yes If no, delete pharmacy and type the correct one.  This is the patient's preferred pharmacy:  [facility] [phone] - [place], [place] - [address] AT [facility] [address] [place] [address] Phone: [phone] Fax: [phone]  [id number] [place], [place] - [address] AT [facility] OF [address] [place] [address] Phone: [phone] Fax: [phone]  [facility] [address] [place] [address] Phone: [phone] Fax: [phone]   Has the prescription been filled recently? Yes  Is the patient out of the medication? Yes  Has the patient been seen for an appointment in the last year OR does the patient have an upcoming appointment? Yes  Can we respond through MyChart? Yes  Agent: Please be advised that Rx refills may take up to 3 business days. We ask that you follow-up with your pharmacy. Patient is calling to ask for a sample because she is completely out of her medicine. Please contact patient at [phone]

## 2024-02-12 ENCOUNTER — Telehealth: Payer: Self-pay

## 2024-02-12 MED ORDER — BUDESONIDE-FORMOTEROL FUMARATE 160-4.5 MCG/ACT IN AERO: INHALATION_SPRAY | RESPIRATORY_TRACT | 12 refills | Status: DC

## 2024-02-12 NOTE — Telephone Encounter (Signed)
 Copied from CRM [id number]. Topic: Clinical - Prescription Issue >> Feb 12, 2024  1:50 PM Theodis Sato wrote: Reason for CRM: Patient is requesting Dr. Sherene Sires to contact her pharmacy and clarify is they need to fill the BREZTRI AEROSPHERE 160-9-4.8 MCG/ACT AERO or budesonide-formoterol (SYMBICORT) 160-4.5 MCG/ACT inhaler  *Patient is out of her medication and is having shortness of breath and is requesting this be done as soon as possible, patient denied nurse triage*  I called [facility] regarding pt's medication. Symbicort was sent in today. I spoke with Kathlene November (pharmacist) who stated that he is still trying to reload the system for this to pop up, but stated he would take care of this.   I called and spoke to pt. I informed pt that the pharmacy would take care of this. Pt verbalized understanding. NFN

## 2024-02-17 ENCOUNTER — Telehealth: Payer: Self-pay | Admitting: Internal Medicine

## 2024-02-17 DIAGNOSIS — J449 Chronic obstructive pulmonary disease, unspecified: Secondary | ICD-10-CM

## 2024-02-17 NOTE — Telephone Encounter (Signed)
 I called and spoke with the pt  She is needing new neb machine Current machine is broken  Order placed urgent  She uses [facility]- aware they will reach out to her Nothing further needed

## 2024-02-17 NOTE — Telephone Encounter (Signed)
 PT needs new neb machine. Please call to advise process.

## 2024-02-25 ENCOUNTER — Encounter: Admitting: Student

## 2024-02-27 ENCOUNTER — Ambulatory Visit (HOSPITAL_COMMUNITY): Admitting: Student

## 2024-02-28 ENCOUNTER — Other Ambulatory Visit: Payer: Self-pay

## 2024-02-28 ENCOUNTER — Encounter (HOSPITAL_COMMUNITY): Payer: Self-pay

## 2024-02-28 ENCOUNTER — Emergency Department (HOSPITAL_COMMUNITY)

## 2024-02-28 ENCOUNTER — Emergency Department (HOSPITAL_COMMUNITY)
Admission: EM | Admit: 2024-02-28 | Discharge: 2024-02-28 | Disposition: A | Discharge status: Home / Self Care | Attending: Emergency Medicine | Admitting: Emergency Medicine

## 2024-02-28 DIAGNOSIS — F17210 Nicotine dependence, cigarettes, uncomplicated: Secondary | ICD-10-CM | POA: Insufficient documentation

## 2024-02-28 DIAGNOSIS — R0602 Shortness of breath: Secondary | ICD-10-CM | POA: Diagnosis present

## 2024-02-28 DIAGNOSIS — J441 Chronic obstructive pulmonary disease with (acute) exacerbation: Secondary | ICD-10-CM | POA: Insufficient documentation

## 2024-02-28 DIAGNOSIS — Z7951 Long term (current) use of inhaled steroids: Secondary | ICD-10-CM | POA: Diagnosis not present

## 2024-02-28 LAB — CBC
HCT: 45.7 % (ref 36.0–46.0)
Hemoglobin: 14.9 g/dL (ref 12.0–15.0)
MCH: 30.2 pg (ref 26.0–34.0)
MCHC: 32.6 g/dL (ref 30.0–36.0)
MCV: 92.7 fL (ref 80.0–100.0)
Platelets: 219 10*3/uL (ref 150–400)
RBC: 4.93 MIL/uL (ref 3.87–5.11)
RDW: 12.3 % (ref 11.5–15.5)
WBC: 7.4 10*3/uL (ref 4.0–10.5)
nRBC: 0 % (ref 0.0–0.2)

## 2024-02-28 LAB — BASIC METABOLIC PANEL WITH GFR
Anion gap: 10 (ref 5–15)
BUN: 5 mg/dL — ABNORMAL LOW (ref 6–20)
CO2: 29 mmol/L (ref 22–32)
Calcium: 9.2 mg/dL (ref 8.9–10.3)
Chloride: 99 mmol/L (ref 98–111)
Creatinine, Ser: 0.6 mg/dL (ref 0.44–1.00)
GFR, Estimated: >60 mL/min (ref >60)
Glucose, Bld: 114 mg/dL — ABNORMAL HIGH (ref 70–99)
Potassium: 3.5 mmol/L (ref 3.5–5.1)
Sodium: 138 mmol/L (ref 135–145)

## 2024-02-28 LAB — TROPONIN I (HIGH SENSITIVITY): Troponin I (High Sensitivity): 17 ng/L (ref <18)

## 2024-02-28 MED ORDER — METHYLPREDNISOLONE SODIUM SUCC 125 MG IJ SOLR: 125.0000 mg | Freq: Once | INTRAMUSCULAR | Status: DC

## 2024-02-28 MED ORDER — ALBUTEROL SULFATE HFA 108 (90 BASE) MCG/ACT IN AERS: 2.0000 | INHALATION_SPRAY | RESPIRATORY_TRACT | 3 refills | Status: DC | PRN

## 2024-02-28 MED ORDER — IPRATROPIUM-ALBUTEROL 0.5-2.5 (3) MG/3ML IN SOLN
3.0000 mL | Freq: Once | RESPIRATORY_TRACT | Status: AC
  Administered 2024-02-28: 3 mL via RESPIRATORY_TRACT
  Filled 2024-02-28: qty 3

## 2024-02-28 MED ORDER — PREDNISONE 20 MG PO TABS: 40.0000 mg | ORAL_TABLET | Freq: Every day | ORAL | 0 refills | Status: DC

## 2024-02-28 NOTE — ED Provider Notes (Signed)
 [place] EMERGENCY DEPARTMENT AT [facility] Provider Note   CSN: [id number] Arrival date & time: 02/28/24  2029     History {Add pertinent medical, surgical, social history, OB history to HPI:1} Chief Complaint  Patient presents with   Shortness of Breath    BIBA c/o sudden onset SOB patient o2 dependent at home 2L via [place]     [name] is a 53 y.o. female.   Shortness of Breath    This patient is a 53 year old female presenting to the hospital with acute shortness of breath, this occurred just prior to arrival while she was on her front porch sweeping although she states that she has chronic shortness of breath and uses oxygen  intermittently at home when she feels like she needs it.  When she does she tripped at 2 L by nasal cannula.  She has been feeling generally poorly over the last couple of months, possibly having some weakness over the last couple of weeks but nothing focal and has chronic cough and shortness of breath which acutely worsened today.  She denies chest pain, back pain, fever, chills, vomiting, diarrhea, rashes, abdominal pain or swelling of her legs.  No prior history of cardiac disease, arrhythmia, congestive heart failure and she has not been having any purulent sputum.  She continues to smoke about a pack of cigarettes per day.  The patient does endorse that her nebulizer machine has recently been broken so she has been using her handheld inhaler which she does not think works as well.  Paramedics were called to the scene and found the patient to have shortness of breath, they thought she might be having an allergic reaction and gave her intramuscular epinephrine , the patient states that she does not really feel any better.  She was also given nebs and steroids prehospital.  Home Medications Prior to Admission medications   Medication Sig Start Date End Date Taking? Authorizing Provider  albuterol  (PROVENTIL ) (2.5 MG/3ML) 0.083% nebulizer  solution USE 2 VIAL VIA NEBULIZER EVERY 4 HOURS AS NEEDED FOR WHEEZING, SHORTNESS OF BREATH 12/31/23   Cobb, Mariah Shines, NP  azithromycin  (ZITHROMAX ) 250 MG tablet Take 2 on day one then 1 daily x 4 days 01/13/24   Diamond Formica, MD  BREZTRI  AEROSPHERE 160-9-4.8 MCG/ACT AERO INHALE 2 PUFFS INTO THE LUNGS IN THE MORNING AND AT BEDTIME 11/26/23   Diamond Formica, MD  budesonide -formoterol  (SYMBICORT ) 160-4.5 MCG/ACT inhaler Take 2 puffs first thing in am and then another 2 puffs about 12 hours later. 02/12/24   Diamond Formica, MD  fluticasone  (FLONASE ) 50 MCG/ACT nasal spray Place 2 sprays into both nostrils daily. 10/31/23   Zheng, Michael, DO  levothyroxine  (SYNTHROID ) 125 MCG tablet Take 1 tablet (125 mcg total) by mouth daily. 08/12/23   Tawkaliyar, Roya, DO  predniSONE  (DELTASONE ) 10 MG tablet Take  4 each am x 2 days,   2 each am x 2 days,  1 each am x 2 days and stop 01/13/24   Diamond Formica, MD  sertraline  (ZOLOFT ) 50 MG tablet Take 1 tablet (50 mg total) by mouth daily. 01/21/24   Zheng, Michael, DO  Tiotropium Bromide  Monohydrate (SPIRIVA  RESPIMAT) 2.5 MCG/ACT AERS 2 puffs each  am 01/13/24   Wert, Michael B, MD  VENTOLIN  HFA 108 (90 Base) MCG/ACT inhaler INHALE 2 PUFFS INTO THE LUNGS EVERY 6 HOURS AS NEEDED FOR WHEEZING OR SHORTNESS OF BREATH 12/12/23   Zheng, Michael, DO  dicyclomine  (BENTYL ) 20 MG tablet  Take 1 tablet (20 mg total) by mouth every 12 (twelve) hours as needed for spasms (abdominal pain/cramping). 08/28/18 03/08/20  Carleton Cheek, PA-C  promethazine  (PHENERGAN ) 25 MG tablet Take 1 tablet (25 mg total) by mouth every 6 (six) hours as needed for nausea or vomiting. 08/28/18 03/08/20  Carleton Cheek, PA-C  sucralfate  (CARAFATE ) 1 GM/10ML suspension Take 10 mLs (1 g total) by mouth 4 (four) times daily -  with meals and at bedtime. 08/07/18 03/08/20  Massie Soles, MD      Allergies    Duragesic -100 [fentanyl ] and Hydrocodone     Review of Systems   Review of Systems  Respiratory:  Positive  for shortness of breath.   All other systems reviewed and are negative.   Physical Exam Updated Vital Signs BP 117/67 (BP Location: Right Arm)   Pulse 81   Temp 98 F (36.7 C) (Oral)   Resp 20   Ht 1.524 m (5')   Wt 45.4 kg   SpO2 95%   BMI 19.53 kg/m  Physical Exam Vitals and nursing note reviewed.  Constitutional:      General: She is not in acute distress.    Appearance: She is well-developed.  HENT:     Head: Normocephalic and atraumatic.     Mouth/Throat:     Pharynx: No oropharyngeal exudate.  Eyes:     General: No scleral icterus.       Right eye: No discharge.        Left eye: No discharge.     Conjunctiva/sclera: Conjunctivae normal.     Pupils: Pupils are equal, round, and reactive to light.  Neck:     Thyroid : No thyromegaly.     Vascular: No JVD.  Cardiovascular:     Rate and Rhythm: Normal rate and regular rhythm.     Heart sounds: Normal heart sounds. No murmur heard.    No friction rub. No gallop.  Pulmonary:     Effort: Pulmonary effort is normal. No respiratory distress.     Breath sounds: Wheezing present. No rales.     Comments: Respiratory rate of 20 breaths/min, prolonged expiratory phase with expiratory wheezing in all lung fields.  No rales, speaks in full sentences, no pursed lip breathing or accessory muscle use Abdominal:     General: Bowel sounds are normal. There is no distension.     Palpations: Abdomen is soft. There is no mass.     Tenderness: There is no abdominal tenderness.  Musculoskeletal:        General: No tenderness. Normal range of motion.     Cervical back: Normal range of motion and neck supple.     Right lower leg: No edema.     Left lower leg: No edema.  Lymphadenopathy:     Cervical: No cervical adenopathy.  Skin:    General: Skin is warm and dry.     Findings: No erythema or rash.  Neurological:     Mental Status: She is alert.     Coordination: Coordination normal.  Psychiatric:        Behavior: Behavior  normal.     ED Results / Procedures / Treatments   Labs (all labs ordered are listed, but only abnormal results are displayed) Labs Reviewed - No data to display  EKG None  Radiology No results found.  Procedures Procedures  {Document cardiac monitor, telemetry assessment procedure when appropriate:1}  Medications Ordered in ED Medications  ipratropium-albuterol  (DUONEB) 0.5-2.5 (3) MG/3ML nebulizer solution 3 mL (has  no administration in time range)  methylPREDNISolone  sodium succinate (SOLU-MEDROL ) 125 mg/2 mL injection 125 mg (has no administration in time range)    ED Course/ Medical Decision Making/ A&P   {   Click here for ABCD2, HEART and other calculatorsREFRESH Note before signing :1}                              Medical Decision Making Amount and/or Complexity of Data Reviewed Labs: ordered. Radiology: ordered.  Risk Prescription drug management.    This patient presents to the ED for concern of shortness of breath, this involves an extensive number of treatment options, and is a complaint that carries with it a high risk of complications and morbidity.  The differential diagnosis includes pneumonia, pneumothorax, allergic reaction, COPD exacerbation   Co morbidities that complicate the patient evaluation  Chronic drug use, in fact she was seen about 1 month ago after crushing up some opiates and snorting them, she was given Narcan  because of that.   Additional history obtained:  Additional history obtained from medical record External records from outside source obtained and reviewed including prior visits and admissions to the hospital including an admission from February 2025, when the patient was admitted to the hospital with an acute exacerbation of her COPD.  She was also diagnosed with influenza A at that time   Lab Tests:  I Ordered, and personally interpreted labs.  The pertinent results include:   CBC, metabolic panel unremarkable, troponin  is in a normal range   Imaging Studies ordered:  I ordered imaging studies including chest x-ray, 2 view I independently visualized and interpreted imaging which showed no signs of pneumonia or pneumothorax I agree with the radiologist interpretation   Cardiac Monitoring: / EKG:  The patient was maintained on a cardiac monitor.  I personally viewed and interpreted the cardiac monitored which showed an underlying rhythm of: Normal sinus rhythm, heart rate in the 60s, no arrhythmia   Problem List / ED Course / Critical interventions / Medication management  Overall the patient is well-appearing and after treatments including bronchodilators and steroids she has an oxygen  of 96 to 99% on room air, she feels comfortable going home, she speaks in full sentences, there is no increased work of breathing and only has a very mild expiratory wheeze.  She has medications refilled including albuterol  and prednisone  as stable for discharge, agreeable to the plan I have reviewed the patients home medicines and have made adjustments as needed    Social Determinants of Health:  ***   Test / Admission - Considered:  ***   {Document critical care time when appropriate:1} {Document review of labs and clinical decision tools ie heart score, Chads2Vasc2 etc:1}  {Document your independent review of radiology images, and any outside records:1} {Document your discussion with family members, caretakers, and with consultants:1} {Document social determinants of health affecting pt's care:1} {Document your decision making why or why not admission, treatments were needed:1} Final Clinical Impression(s) / ED Diagnoses Final diagnoses:  None    Rx / DC Orders ED Discharge Orders     None

## 2024-02-28 NOTE — Discharge Instructions (Addendum)
 Your x-ray has been reassuring, there is no pneumonia, your heart rate is normal,, there is no signs of any other abnormalities, you can follow-up with your doctor in the outpatient clinic, please take the following medications  Albuterol  is an inhaled medication which can help you to breathe better, you should take 2 puffs every 4 hours as needed, this may cause your heart to feel like it is racing, this should be a temporary side effect.   Prednisone  is a steroid that helps to reduce certain types of inflammation and may be used for allergic reactions, some rashes such as poison ivy or dermatitis, for asthma attacks or bronchitis and for certain types of pain.  Please take this medicine exactly as prescribed - 40mg  by mouth daily for 5 days.  This can have certain side effects with some people including feeling like you can't sleep, feeling anxious or feeling like you are on a high.  It should not cause weight gain if only taken for a short time.  Please be aware that this medication may also cause an elevation in your blood sugar if you are a diabetic so if you are a diabetic you will need to keep a very close eye on your blood sugar, make sure that you are eating an extremely low level of carbohydrates and taking your medications exactly as prescribed.  If you should develop severe high blood sugar or start to feel poorly return to the emergency department immediately    Thank you for allowing us  to treat you in the emergency department today.  After reviewing your examination and potential testing that was done it appears that you are safe to go home.  I would like for you to follow-up with your doctor within the next several days, have them obtain your records and follow-up with them to review all potential tests and results from your visit.  If you should develop severe or worsening symptoms return to the emergency department immediately

## 2024-02-28 NOTE — ED Notes (Signed)
 Patient dc'ed home at this time no acute distress noted vss pt a/o reviewed discharge instructions medication and follow up care answered all questions verbalized understanding. Patient ambulates steady to lobby without assist. Encouraged to return to er if s/s persist or become bothersome.

## 2024-02-28 NOTE — ED Triage Notes (Signed)
 Patient reports "smoked a joint and thinks something was in it"

## 2024-02-28 NOTE — ED Triage Notes (Signed)
 BIBA from home patient c/o sudden onset SOB patient outside sweeping, patient has h/o COPD o2 dependent patient 2L [place] duoneb x2 spi 0.5mg  IM solumedrol 125 mag 2 gm admin PTA by EMS patient has LAC #18 patient wheezing on arrival spo2 96% RA.

## 2024-03-03 ENCOUNTER — Telehealth: Payer: Self-pay | Admitting: Student

## 2024-03-03 DIAGNOSIS — F411 Generalized anxiety disorder: Secondary | ICD-10-CM

## 2024-03-03 DIAGNOSIS — F319 Bipolar disorder, unspecified: Secondary | ICD-10-CM

## 2024-03-03 MED ORDER — HYDROXYZINE HCL 10 MG PO TABS: 10.0000 mg | ORAL_TABLET | Freq: Every evening | ORAL | 0 refills | Status: DC | PRN
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
## 2024-03-04 NOTE — Assessment & Plan Note (Signed)
 She missed appointment for psychiatry recently. I encouraged her to call them for follow-up. She denies suicidal ideation.  -follow-up in 4 weeks

## 2024-03-04 NOTE — Assessment & Plan Note (Signed)
 Patient long standing history of anxiety and bipolar 1. She is currently taking zoloft  25 mg. In December she was referred to psychiatry, but missed appointment with them. She is currently going through a divorce and has several life stressors. She feels like she is having trouble sleeping due to anxiety. P: Continue zoloft  25 mg, ideally she would be on a mood stabilizer as well. I encouraged her to follow-up with psych She requested refill on lorazepam , I talked with her about this not being a good medication for her. She was open to trying hydroxyzine  to see if that would help with insomnia symptoms from anxiety. Start hydroxyzine  10 mg at bedtime PRN Follow-up in 4 weeks

## 2024-03-13 NOTE — Progress Notes (Signed)
 Internal Medicine Clinic Attending  Case discussed with the resident at the time of the visit.  We reviewed the resident's history and exam and pertinent patient test results.  I agree with the assessment, diagnosis, and plan of care documented in the resident's note.

## 2024-03-25 ENCOUNTER — Telehealth: Payer: Self-pay | Admitting: Internal Medicine

## 2024-03-25 ENCOUNTER — Other Ambulatory Visit: Payer: Self-pay | Admitting: Nurse Practitioner

## 2024-03-25 ENCOUNTER — Other Ambulatory Visit: Payer: Self-pay | Admitting: Internal Medicine

## 2024-03-25 DIAGNOSIS — J441 Chronic obstructive pulmonary disease with (acute) exacerbation: Secondary | ICD-10-CM

## 2024-03-25 NOTE — Telephone Encounter (Signed)
 Pt is requesting a refill on the Albuterol  nebulizer. The RX is marked as discontinued and the lov does not have any information on if the pt was to stay on this or not take it. Pt requesting a refill.

## 2024-03-25 NOTE — Telephone Encounter (Signed)
 PT presents to front for Breztri  samples. Please call her when ready @ [phone]. Also, she needs neb medication refilled. Please send in 3 mo supply If poss. Pharm is Therapist, occupational at [facility]

## 2024-03-25 NOTE — Telephone Encounter (Signed)
 PT calling again about the issues below. Please call. She is using her rescue inhaler now. Thanks.

## 2024-03-26 ENCOUNTER — Ambulatory Visit: Payer: Self-pay

## 2024-03-26 ENCOUNTER — Ambulatory Visit: Admitting: Internal Medicine

## 2024-03-26 ENCOUNTER — Ambulatory Visit: Payer: Self-pay | Admitting: Internal Medicine

## 2024-03-26 ENCOUNTER — Telehealth: Payer: Self-pay | Admitting: Internal Medicine

## 2024-03-26 ENCOUNTER — Telehealth: Payer: Self-pay

## 2024-03-26 NOTE — Telephone Encounter (Signed)
 PT calling again. Now an elevated caller. Sent over 2 High Priority messages yesterday. Please resolve.

## 2024-03-26 NOTE — Telephone Encounter (Signed)
 Please route to Dr. Waymond Hailey as he saw her last. Would assume he would want her to continue. Thanks.

## 2024-03-26 NOTE — Telephone Encounter (Addendum)
 Please see NT encounter. Pt initially requesting albuterol  NEB, that is not on her current med list. Triager will continue to attempt to reach out to pt for triage.

## 2024-03-26 NOTE — Telephone Encounter (Signed)
 Ok for both  Albuterol  2.5 mg q 4 h prn  month supply refill x 11

## 2024-03-26 NOTE — Telephone Encounter (Signed)
 Spoke to patient. She is requesting either sample or Rx for Breztri . Pt is aware that according to her chart, Breztri  was sent to preferred pharmacy. She will call pharmacy to verify. She is also requesting albuterol  solution.   Dr. Waymond Hailey, please advise if okay to order albuterol  solution?

## 2024-03-26 NOTE — Telephone Encounter (Signed)
 Lost connection with agent before patient could be transferred to this RN.   Copied from CRM [id number]. Topic: Clinical - Red Word Triage >> Mar 26, 2024 10:43 AM Tyronne Galloway wrote: Red Word that prompted transfer to Nurse Triage: Pt stated she is having difficulty breathing and shortness of breath due to needing her breztri  samples and nebulizer solutions. I have sent in a refill for the albuterol  nebulizer solution, however the clinic has not yet provided the samples of breztri  to the patient.

## 2024-03-26 NOTE — Telephone Encounter (Signed)
 Copied from CRM [id number]. Topic: Clinical - Medication Refill >> Mar 26, 2024 10:39 AM Dustin F wrote: Medication: albuterol  (PROVENTIL ) (2.5 MG/3ML) 0.083% nebulizer solution  Has the patient contacted their pharmacy? Yes (Agent: If no, request that the patient contact the pharmacy for the refill. If patient does not wish to contact the pharmacy document the reason why and proceed with request.) (Agent: If yes, when and what did the pharmacy advise?)  This is the patient's preferred pharmacy:  [id number] - [place], [place] - [address] AT [facility] OF [address] [place] [address] Phone: [phone] Fax: [phone]  Is this the correct pharmacy for this prescription? Yes If no, delete pharmacy and type the correct one.   Has the prescription been filled recently? Yes; Feb of 2025. I do not see the solution listed on the medications.   Is the patient out of the medication? Yes  Has the patient been seen for an appointment in the last year OR does the patient have an upcoming appointment? Yes  Can we respond through MyChart? No; prefer a phone call  Agent: Please be advised that Rx refills may take up to 3 business days. We ask that you follow-up with your pharmacy.

## 2024-03-26 NOTE — Telephone Encounter (Signed)
  Chief Complaint: SOB Symptoms: SOB above baseline, requiring more O2 use Frequency: x few days - 1 week Pertinent Negatives: Patient denies fever, URI sx, severe SOB, CP Disposition: [] ED /[] Urgent Care (no appt availability in office) / [x] Appointment(In office/virtual)/ []  [facility] [facility]/ [] Home Care/ [] Refused Recommended Disposition /[] [place] [facility]/ []  Follow-up with PCP Additional Notes: Pt initially calling for NEB refill, but upon further investigation, pt has been having worsening SOB above baseline and requiring more consistent O2 use. Pt denies any fevers, URI sx, and CP. Of note, pt endorses has been out of all respiratory medications for the past few days. Triager does appreciate audible SOB/wheezing during call. Pt is speaking in partial-full sentences. Scheduled patient per protocol on Mar 26, 2024. Patient verbalized understanding and to call back/go to ED with worsening symptoms.      Reason for Disposition  [1] Longstanding difficulty breathing (e.g., CHF, COPD, emphysema) AND [2] WORSE than normal  Answer Assessment - Initial Assessment Questions E2C2 Pulmonary Triage - Initial Assessment Questions "Chief Complaint (e.g., cough, sob, wheezing, fever, chills, sweat or additional symptoms) *Go to specific symptom protocol after initial questions. "BREATHING has been a little bit harder" - pt reports r/t heat/pollen  "How long have symptoms been present?" X 1 week  Have you tested for COVID or Flu? Note: If not, ask patient if a home test can be taken. If so, instruct patient to call back for positive results. No  MEDICINES:   "Have you used any OTC meds to help with symptoms?" No If yes, ask "What medications?"   "Have you used your inhalers/maintenance medication?" Yes If yes, "What medications?"  (SYMBICORT ) 160-4.5 MCG/ACT - reports is not working Albuterol  NEB Spiriva  - does not have  Reports has been out of INH/NEB for a few  days  OXYGEN : "Do you wear supplemental oxygen ?" Yes If yes, "How many liters are you supposed to use?" 2L PRN  "Do you monitor your oxygen  levels?" No If yes, "What is your reading (oxygen  level) today?" Does not have a machine    3. PATTERN "Does the difficult breathing come and go, or has it been constant since it started?"      constant 4. SEVERITY: "How bad is your breathing?" (e.g., mild, moderate, severe)    - MILD: No SOB at rest, mild SOB with walking, speaks normally in sentences, can lie down, no retractions, pulse < 100.    - MODERATE: SOB at rest, SOB with minimal exertion and prefers to sit, cannot lie down flat, speaks in phrases, mild retractions, audible wheezing, pulse 100-120.    - SEVERE: Very SOB at rest, speaks in single words, struggling to breathe, sitting hunched forward, retractions, pulse > 120      Mild- moderate - endorses SOB with exertion above baseline  6. CARDIAC HISTORY: "Do you have any history of heart disease?" (e.g., heart attack, angina, bypass surgery, angioplasty)      denies 7. LUNG HISTORY: "Do you have any history of lung disease?"  (e.g., pulmonary embolus, asthma, emphysema)     COPD, nodule 8. CAUSE: "What do you think is causing the breathing problem?"      Lack of medication, environmental triggers 9. OTHER SYMPTOMS: "Do you have any other symptoms? (e.g., dizziness, runny nose, cough, chest pain, fever)     denies  Protocols used: Breathing Difficulty-A-AH

## 2024-03-26 NOTE — Telephone Encounter (Signed)
 Copied from CRM [id number]. Topic: General - Other >> Mar 25, 2024  5:16 PM [name] wrote: Reason for CRM:  (Patient was in office today) Patient calling regarding Breztri  samples. Patient states she needs the samples.  Also, she needs neb medication refilled. Pharm is Therapist, occupational at [facility].    Patient very upset states it's after 5 and no one called her back or anything.. Stated that she feels ignored. She came in today because no one will return her call and feels she's in a bad situation and we don't care.  Please see 03/26/2024 phone note.

## 2024-03-27 ENCOUNTER — Other Ambulatory Visit (HOSPITAL_BASED_OUTPATIENT_CLINIC_OR_DEPARTMENT_OTHER): Payer: Self-pay

## 2024-03-27 DIAGNOSIS — J441 Chronic obstructive pulmonary disease with (acute) exacerbation: Secondary | ICD-10-CM

## 2024-03-27 MED ORDER — ALBUTEROL SULFATE (2.5 MG/3ML) 0.083% IN NEBU: 2.5000 mg | INHALATION_SOLUTION | RESPIRATORY_TRACT | 11 refills | Status: DC | PRN

## 2024-03-27 NOTE — Telephone Encounter (Signed)
 Rx sent to pharmacy and pt notified on VM

## 2024-04-02 ENCOUNTER — Encounter: Admitting: Student

## 2024-04-02 ENCOUNTER — Telehealth: Payer: Self-pay | Admitting: Student

## 2024-04-02 NOTE — Telephone Encounter (Signed)
 Patient contact via telephone due to no showing her appointment this afternoon.  No answer, but was able to leave detailed(asking to please give our office a call back in next 30 days to reschedule this appointment).
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
## 2024-04-20 ENCOUNTER — Telehealth: Payer: Self-pay

## 2024-04-20 NOTE — Telephone Encounter (Signed)
 Copied from CRM [id number]. Topic: Clinical - Medical Advice >> Apr 20, 2024 12:30 PM Dustin F wrote: Reason for CRM: Pt is requesting her neb solution to be switched to Ipratropium bromide  solution. Pt stated she used some of her family member's solution when she ran out, and she never felt better when using it. Please call the pt back at [phone].  ATC x1 VM for patient to call our office back regarding prior message .

## 2024-04-21 NOTE — Telephone Encounter (Signed)
 That's fine but should not use it while on spiriva  or breztri  and it should have albuterol  2.5 mg mixed with it and take up to qid prn but no more phone care or adjustments to meds with ov 1st and all meds in hand to regroup because we verify she's doing things correctly before asking her to do more.

## 2024-04-21 NOTE — Telephone Encounter (Signed)
 Copied from CRM [id number]. Topic: General - Other >> Apr 20, 2024  5:13 PM Eveleen Hinds B wrote: Reason for CRM: Patient returning call to [place]. Patient asked if medication was called in. Please call patient.  Spoke with patient regarding prior message. 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 Patient would like to change her nebulizer medication to Ipratropium bromide  solution.   Dr.Wert can you please advise   Thank you

## 2024-04-21 NOTE — Telephone Encounter (Signed)
 Called the pt and there was no answer- LMTCB

## 2024-04-22 ENCOUNTER — Other Ambulatory Visit: Payer: Self-pay | Admitting: *Deleted

## 2024-04-22 DIAGNOSIS — J449 Chronic obstructive pulmonary disease, unspecified: Secondary | ICD-10-CM

## 2024-04-23 ENCOUNTER — Telehealth: Payer: Self-pay | Admitting: *Deleted

## 2024-04-23 ENCOUNTER — Ambulatory Visit (INDEPENDENT_AMBULATORY_CARE_PROVIDER_SITE_OTHER): Admitting: Internal Medicine

## 2024-04-23 ENCOUNTER — Encounter

## 2024-04-23 ENCOUNTER — Telehealth: Payer: Self-pay | Admitting: Internal Medicine

## 2024-04-23 ENCOUNTER — Encounter: Payer: Self-pay | Admitting: Internal Medicine

## 2024-04-23 VITALS — BP 124/86 | HR 81 | Temp 98.4°F | Ht 60.0 in | Wt 92.8 lb

## 2024-04-23 DIAGNOSIS — J441 Chronic obstructive pulmonary disease with (acute) exacerbation: Secondary | ICD-10-CM

## 2024-04-23 DIAGNOSIS — J9611 Chronic respiratory failure with hypoxia: Secondary | ICD-10-CM | POA: Diagnosis not present

## 2024-04-23 DIAGNOSIS — J449 Chronic obstructive pulmonary disease, unspecified: Secondary | ICD-10-CM

## 2024-04-23 DIAGNOSIS — F17210 Nicotine dependence, cigarettes, uncomplicated: Secondary | ICD-10-CM

## 2024-04-23 MED ORDER — IPRATROPIUM BROMIDE 0.02 % IN SOLN: 0.5000 mg | Freq: Four times a day (QID) | RESPIRATORY_TRACT | 12 refills | Status: AC

## 2024-04-23 NOTE — Telephone Encounter (Signed)
 Per Dolanda at [facility]  It looks like the patient got a Nebulizer 05/29/2022 with her insurance (medicaid) and they will not cover another one but every 3 years so she wouldn't be eligible till 05/29/2025.

## 2024-04-23 NOTE — Telephone Encounter (Signed)
 Copied from CRM [id number]. Topic: Clinical - Order For Equipment >> Apr 21, 2024  4:07 PM Isabell A wrote: Reason for CRM: Patient went to [facility] - states they have not received an order for her nebulizer machine.  PT WAS SEEN IN CLINIC 04/23/24 AND NEB MACHINE ORDER WAS PLACED

## 2024-04-23 NOTE — Patient Instructions (Addendum)
 My office will be contacting you by phone for referral to lung cancer screening   [phone]) - if you don't hear back from my office within one week,  please call us  back or notify us  thru MyChart and we'll address it right away.    Plan A = Automatic = Always=   Symbicort  160 Take 2 puffs first thing in am and then another 2 puffs about 12 hours later.   And ipatropium 0.5 up to 4 x daily per neb   Plan B = Backup (to supplement plan A, not to replace it) Only use your albuterol  inhaler as a rescue medication to be used if you can't catch your breath by resting or doing a relaxed purse lip breathing pattern.  - The less you use it, the better it will work when you need it. - Ok to use the inhaler up to 2 puffs  every 4 hours if you must but call for appointment if use goes up over your usual need - Don't leave home without it !!  (think of it like the spare tire for your car)   Plan C = Crisis (instead of Plan B but only if Plan B stops working) - only use your albuterol  nebulizer if you first try Plan B and it fails to help > ok to use the nebulizer up to every 4 hours but if start needing it regularly call for immediate appointment  We will order you a  new nebulizer    Keep your follow up appt for pft

## 2024-04-23 NOTE — Telephone Encounter (Addendum)
 Spoke with the pt and notified of response from [facility]  She verbalized understanding Nothing further needed

## 2024-04-25 NOTE — Assessment & Plan Note (Signed)
 4-5 min discussion re active cigarette smoking in addition to office E&M  Ask about tobacco use:   ongoing  Advise quitting   advised life or breath situation and should not use 02 around combustible or bringing  products like cigarettes  Assess willingness:  Not fully committed at this point Assist in quit attempt:  Per PCP when ready Arrange follow up:   Follow up per Primary Care planned

## 2024-04-25 NOTE — Assessment & Plan Note (Signed)
 Active smoker -  Spirometry 09/18/2018  FEV1 1.1 (44%)  Ratio 49 with classic curvature p am advair/spiriva  dpi's - 09/18/2018  After extensive coaching inhaler device,  effectiveness =    75% with smi so try change to stiolto with pred x 6 d as plan D  > not doing as of 10/19/2019  - 05/24/2023  After extensive coaching inhaler device,  effectiveness =    75% with hfa > continue breztri  /flutter valve and approp saba  - 01/13/2024  After extensive coaching inhaler device,  effectiveness =     75%  with hfa   and smi> changed to symb 160 / spiriva  2.5 due to insurance not covering breztri   - 04/23/2024 prefers using ipatropium neb so changed to symbicort  160 and neb ipatropium qid with no change in alb using new ABC plan  see avs for instructions unique to this ov

## 2024-04-25 NOTE — Assessment & Plan Note (Addendum)
 As of 04/23/2024  rx = 2lpm hs and prn daytime  Again advised:  Make sure you check your oxygen  saturation  AT  your highest level of activity (not after you stop)   to be sure it stays over 90% and adjust  02 flow upward to maintain this level if needed but remember to turn it back to previous settings when you stop (to conserve your supply).        F/u as planned for pfts next   Each maintenance medication was reviewed in detail including emphasizing most importantly the difference between maintenance and prns and under what circumstances the prns are to be triggered using an action plan format where appropriate.  Total time for H and P, chart review, counseling, reviewing hfa/neb/02/pulse ox  device(s) and generating customized AVS unique to this office visit / same day charting = 21 min

## 2024-04-27 NOTE — Telephone Encounter (Signed)
 ATC X2. LMTCB. I will send pt a Mychart message to let her know then completing note per protocol. NFN.

## 2024-05-07 ENCOUNTER — Other Ambulatory Visit: Payer: Self-pay

## 2024-05-07 ENCOUNTER — Other Ambulatory Visit: Payer: Self-pay | Admitting: Internal Medicine

## 2024-05-07 ENCOUNTER — Telehealth: Payer: Self-pay | Admitting: Acute Care

## 2024-05-07 DIAGNOSIS — Z122 Encounter for screening for malignant neoplasm of respiratory organs: Secondary | ICD-10-CM

## 2024-05-07 DIAGNOSIS — Z87891 Personal history of nicotine dependence: Secondary | ICD-10-CM

## 2024-05-07 DIAGNOSIS — F17210 Nicotine dependence, cigarettes, uncomplicated: Secondary | ICD-10-CM

## 2024-05-07 NOTE — Telephone Encounter (Signed)
 Lung Cancer Screening Narrative/Criteria Questionnaire (Cigarette Smokers Only- No Cigars/Pipes/vapes)   [name]   SDMV:05/20/24 at 230p/[facility]atalie                                           [birth year]              LDCT: 05/21/24 at 330p/ [facility]    53 y.o.   Phone: [phone]  Lung Screening Narrative (confirm age 48-77 yrs Medicare / 50-80 yrs Private pay insurance)   Insurance information:Medicaid   Referring Provider:Wert   This screening involves an initial phone call with a team member from our program. It is called a shared decision making visit. The initial meeting is required by insurance and Medicare to make sure you understand the program. This appointment takes about 15-20 minutes to complete. The CT scan will completed at a separate date/time. This scan takes about 5-10 minutes to complete and you may eat and drink before and after the scan.  Criteria questions for Lung Cancer Screening:   Are you a current or former smoker? Current Age began smoking: 18y   If you are a former smoker, what year did you quit smoking? NA   To calculate your smoking history, I need an accurate estimate of how many packs of cigarettes you smoked per day and for how many years. (Not just the number of PPD you are now smoking)   Years smoking 35 x Packs per day 2 = Pack years 70 Recently cut back to 1PPD   (at least 20 pack yrs)   (Make sure they understand that we need to know how much they have smoked in the past, not just the number of PPD they are smoking now)  Do you have a personal history of cancer?  No    Do you have a family history of cancer? Yes  (cancer type and and relative) Father lung and stomach  Are you coughing up blood?  No  Have you had unexplained weight loss of 15 lbs or more in the last 6 months? No *lost wt over past year with passing of family member and thyroid  medication changes  It looks like you meet all criteria.     Additional information: N/A

## 2024-05-20 ENCOUNTER — Ambulatory Visit: Admitting: Acute Care

## 2024-05-20 DIAGNOSIS — F17210 Nicotine dependence, cigarettes, uncomplicated: Secondary | ICD-10-CM | POA: Diagnosis not present

## 2024-05-20 NOTE — Patient Instructions (Signed)

## 2024-05-20 NOTE — Progress Notes (Signed)
 Virtual Visit via Telephone Note  I connected with [name] on 05/20/24 at  2:30 PM EDT by telephone and verified that I am speaking with the correct person using two identifiers.  Location: Patient: [name] Provider: Laneta Speaks, RN   I discussed the limitations, risks, security and privacy concerns of performing an evaluation and management service by telephone and the availability of in person appointments. I also discussed with the patient that there may be a patient responsible charge related to this service. The patient expressed understanding and agreed to proceed.   Shared Decision Making Visit Lung Cancer Screening Program [phone])   Eligibility: Age 53 y.o. Pack Years Smoking History Calculation 70 (# packs/per year x # years smoked) Recent History of coughing up blood  no Unexplained weight loss? Yes (relates it to stress) ( >Than 15 pounds within the last 6 months ) Prior History Lung / other cancer no (Diagnosis within the last 5 years already requiring surveillance chest CT Scans). Smoking Status Current Smoker Former Smokers: Years since quit: n/a  Quit Date: n/a  Visit Components: Discussion included one or more decision making aids. yes Discussion included risk/benefits of screening. yes Discussion included potential follow up diagnostic testing for abnormal scans. yes Discussion included meaning and risk of over diagnosis. yes Discussion included meaning and risk of False Positives. yes Discussion included meaning of total radiation exposure. yes  Counseling Included: Importance of adherence to annual lung cancer LDCT screening. yes Impact of comorbidities on ability to participate in the program. yes Ability and willingness to under diagnostic treatment. yes  Smoking Cessation Counseling: Current Smokers:  Discussed importance of smoking cessation. yes Information about tobacco cessation classes and interventions provided to patient.  yes Patient provided with ticket for LDCT Scan. no Symptomatic Patient. no  Counseling(Intermediate counseling: > three minutes) 99406 Diagnosis Code: Tobacco Use Z72.0 Asymptomatic Patient yes  Counseling (Intermediate counseling: > three minutes counseling) [id number] Former Smokers:  Discussed the importance of maintaining cigarette abstinence. yes Diagnosis Code: Personal History of Nicotine  Dependence. S12.108 Information about tobacco cessation classes and interventions provided to patient. Yes Patient provided with ticket for LDCT Scan. no Written Order for Lung Cancer Screening with LDCT placed in Epic. Yes (CT Chest Lung Cancer Screening Low Dose W/O CM) [id number] Z12.2-Screening of respiratory organs Z87.891-Personal history of nicotine  dependence   Laneta Speaks, RN

## 2024-05-21 ENCOUNTER — Ambulatory Visit (HOSPITAL_COMMUNITY)

## 2024-05-27 ENCOUNTER — Ambulatory Visit: Payer: Self-pay | Admitting: Internal Medicine

## 2024-05-27 NOTE — Telephone Encounter (Signed)
 Pt requesting abx, prednisone  and cough syrup. Please advise

## 2024-05-27 NOTE — Telephone Encounter (Signed)
 FYI Only or Action Required?: Action required by provider: Requesting Prednisone  and Z-Pak if possible.  Patient is followed in Pulmonology for COPD, last seen on 04/23/2024 by Darlean Ozell NOVAK, MD.  Called Nurse Triage reporting No chief complaint on file..  Symptoms began several days ago.  Interventions attempted: Rescue inhaler and Maintenance inhaler.  Symptoms are: gradually worsening.  Triage Disposition: See HCP Within 4 Hours (Or PCP Triage)  Patient/caregiver understands and will follow disposition?: No, wishes to speak with PCP    Copied from CRM [id number]. Topic: Clinical - Red Word Triage >> May 27, 2024 12:49 PM Russell PARAS wrote: Red Word that prompted transfer to Nurse Triage:   Symptoms for 4-5 days Family is also sick  Severe cough Phlegm present, green color Chest tightness Mild wheezing, but does have history of COPD  Requesting if prednisone , cough medicine, and Zpak be called in to pharmacy Car is broken and difficult to get to Urgent care.   Reason for Disposition  [1] MILD difficulty breathing (e.g., minimal/no SOB at rest, SOB with walking, pulse < 100) AND [2] still present when not coughing  Answer Assessment - Initial Assessment Questions Patient reports she does not currently have transpiration and is requesting a prescription Prednisone  and a Z-Pak, which she states she can get someone to bring to her. Patient's husband has similar symptoms. Patient is requesting a call back with a response to her request.       1. ONSET: When did the cough begin?      4-5 days  2. SEVERITY: How bad is the cough today?      Moderate  3. SPUTUM: Describe the color of your sputum (e.g., none, dry cough; clear, white, yellow, green)     Dark green 4. HEMOPTYSIS: Are you coughing up any blood? If Yes, ask: How much? (e.g., flecks, streaks, tablespoons, etc.)     No 5. DIFFICULTY BREATHING: Are you having difficulty breathing? If Yes, ask: How bad is  it? (e.g., mild, moderate, severe)      Moderate  6. FEVER: Do you have a fever? If Yes, ask: What is your temperature, how was it measured, and when did it start?     No 7. CARDIAC HISTORY: Do you have any history of heart disease? (e.g., heart attack, congestive heart failure)      No 8. LUNG HISTORY: Do you have any history of lung disease?  (e.g., pulmonary embolus, asthma, emphysema)     COPD 9. PE RISK FACTORS: Do you have a history of blood clots? (or: recent major surgery, recent prolonged travel, bedridden)     No 10. OTHER SYMPTOMS: Do you have any other symptoms? (e.g., runny nose, wheezing, chest pain)       Wheezing, some chest tightness  Protocols used: Cough - Acute Productive-A-AH

## 2024-05-28 MED ORDER — AZITHROMYCIN 250 MG PO TABS: ORAL_TABLET | ORAL | 0 refills | Status: DC

## 2024-05-28 MED ORDER — PREDNISONE 10 MG PO TABS: ORAL_TABLET | ORAL | 0 refills | Status: DC

## 2024-05-28 NOTE — Telephone Encounter (Signed)
 RX sent to pharmacy and pt notified on VM

## 2024-05-29 ENCOUNTER — Encounter

## 2024-06-03 ENCOUNTER — Telehealth: Payer: Self-pay | Admitting: Acute Care

## 2024-06-03 NOTE — Telephone Encounter (Signed)
 Returned call to patient to reschedule LDCT.  No answer. Left VM.  Has completed sdmv but did not complete LDCT

## 2024-06-09 ENCOUNTER — Ambulatory Visit: Payer: Self-pay | Admitting: Internal Medicine

## 2024-06-09 NOTE — Telephone Encounter (Signed)
 Called and spoke with pt she states she is hard time breathing and saw Dr Darlean last week but she has not seen in since 04/23/24. She said he prescribed prednisone , she would like more preds and antibiotics, also states that she need a new nebulizer and a 90 day supply of soul with it. Told her she has an appt with Dr Theophilus Thursday to go over all her symptoms and what medications she should be on. Informed her to go to the ER if symptoms worsen. Pt was very unclear and hard to understand when speaking with her over the phone, slurred words and pt would go quite a few times. Made sure she understood she has an appt. Thursday.

## 2024-06-09 NOTE — Telephone Encounter (Addendum)
 FYI Only or Action Required?: FYI only for provider.  Patient is followed in Pulmonology for COPD, last seen on 04/23/2024 by Darlean Ozell NOVAK, MD.  Called Nurse Triage reporting Wheezing and Shortness of Breath.  Symptoms began several weeks ago.  Interventions attempted: Prescription medications: azithromycin  (ZITHROMAX ) 250 MG table, Rescue inhaler, and Maintenance inhaler.  Symptoms are: unchanged.  Triage Disposition: See HCP Within 4 Hours (Or PCP Triage)  Patient/caregiver understands and will follow disposition?: Yes     Copied from CRM #[id number]. Topic: Clinical - Red Word Triage >> Jun 09, 2024  1:35 PM Russell PARAS wrote: Red Word that prompted transfer to Nurse Triage:   Symptoms started this morning Contacted EMS  EMS reported chest tightness with wheezing.  Had IC procedure yesterday and is hesitant to come in to see [facility] like she has been running a fever the past 2 days, but does take aspirin daily.   Requesting another round of antibiotic and prednisone  to be called. Reason for Disposition  [1] Longstanding difficulty breathing (e.g., CHF, COPD, emphysema) AND [2] WORSE than normal  Answer Assessment - Initial Assessment Questions E2C2 Pulmonary Triage - Initial Assessment Questions Chief Complaint (e.g., cough, sob, wheezing, fever, chills, sweat or additional symptoms) *Go to specific symptom protocol after initial questions. Wheezing, SOB, anxiety, productive dark green cough Reports called EMS this AM - gave pt albuterol  treatment Reports recently finishing abx/steroids last week  How long have symptoms been present? A few weeks  Have you tested for COVID or Flu? Note: If not, ask patient if a home test can be taken. If so, instruct patient to call back for positive results. No  MEDICINES:   Have you used any OTC meds to help with symptoms? No If yes, ask What medications? N/a  Have you used your inhalers/maintenance medication? Yes If  yes, What medications? budesonide -formoterol  (SYMBICORT ) - twice in AM and twice in PM Albuterol  PRN - last had when EMS arrived [facility] reviewed/reinforced albuterol  usage and SIG.    If inhaler, ask How many puffs and how often? Note: Review instructions on medication in the chart. See above  OXYGEN : Do you wear supplemental oxygen ? Yes If yes, How many liters are you supposed to use? 2L PRN  Do you monitor your oxygen  levels? Yes If yes, What is your reading (oxygen  level) today? Pt reports it's good when EMS came  Pt attempted to recheck during triage, but unable to get machine working  What is your usual oxygen  saturation reading?  (Note: Pulmonary O2 sats should be 90% or greater) unknown      1. RESPIRATORY STATUS: Describe your breathing? (e.g., wheezing, shortness of breath, unable to speak, severe coughing)      See above 2. ONSET: When did this breathing problem begin?      See above 3. PATTERN Does the difficult breathing come and go, or has it been constant since it started?      Comes and goes 4. SEVERITY: How bad is your breathing? (e.g., mild, moderate, severe)      Mild-moderate Triager does appreciate audible mild SOB during call. Pt is speaking in partial-full sentences.   5. RECURRENT SYMPTOM: Have you had difficulty breathing before? If Yes, ask: When was the last time? and What happened that time?      Last week Just finshed abx/steroids 6. CARDIAC HISTORY: Do you have any history of heart disease? (e.g., heart attack, angina, bypass surgery, angioplasty)      denies 7. LUNG  HISTORY: Do you have any history of lung disease?  (e.g., pulmonary embolus, asthma, emphysema)     COPD, nodule 8. CAUSE: What do you think is causing the breathing problem?      flare 9. OTHER SYMPTOMS: Do you have any other symptoms? (e.g., chest pain, cough, dizziness, fever, runny nose)     Nasal congestion, denies other sx 10. O2  SATURATION MONITOR:  Do you use an oxygen  saturation monitor (pulse oximeter) at home? If Yes, ask: What is your reading (oxygen  level) today? What is your usual oxygen  saturation reading? (e.g., 95%)       See above 11. PREGNANCY: Is there any chance you are pregnant? When was your last menstrual period?       N/a 12. TRAVEL: Have you traveled out of the country in the last month? (e.g., travel history, exposures)       N/a    Pt requesting additional Rx from Dr Darlean. Scheduled patient on the next available acute appt on June 11, 2024 with Dr. Theophilus .  Protocols used: Breathing Difficulty-A-AH

## 2024-06-11 ENCOUNTER — Encounter: Payer: Self-pay | Admitting: Pulmonary Disease

## 2024-06-11 ENCOUNTER — Ambulatory Visit

## 2024-06-11 ENCOUNTER — Ambulatory Visit: Admitting: Pulmonary Disease

## 2024-06-11 VITALS — BP 114/80 | HR 65 | Temp 97.7°F | Ht 60.0 in | Wt 93.8 lb

## 2024-06-11 DIAGNOSIS — J441 Chronic obstructive pulmonary disease with (acute) exacerbation: Secondary | ICD-10-CM

## 2024-06-11 MED ORDER — METHYLPREDNISOLONE ACETATE 80 MG/ML IJ SUSP
120.0000 mg | Freq: Once | INTRAMUSCULAR | Status: AC
  Administered 2024-06-11: 120 mg via INTRAMUSCULAR

## 2024-06-11 MED ORDER — PREDNISONE 10 MG PO TABS: 60.0000 mg | ORAL_TABLET | Freq: Every day | ORAL | 0 refills | Status: DC

## 2024-06-11 MED ORDER — AMOXICILLIN-POT CLAVULANATE 875-125 MG PO TABS: 1.0000 | ORAL_TABLET | Freq: Two times a day (BID) | ORAL | 0 refills | Status: DC

## 2024-06-11 NOTE — Patient Instructions (Signed)
  VISIT SUMMARY: Today, we discussed your ongoing respiratory symptoms and your COPD management. You reported persistent chest tightness and difficulty breathing, especially in the mornings, despite completing a course of antibiotics. We also reviewed your smoking habits and recent emergency response due to breathing difficulties.  YOUR PLAN: -CHRONIC OBSTRUCTIVE PULMONARY DISEASE (COPD) EXACERBATION: COPD exacerbation means a worsening of your COPD symptoms. We will order a chest x-ray to check your lungs, prescribe a new antibiotic called Augmentin , and give you a steroid injection. You will also start a prednisone  course at 60 mg. Your prescriptions will be sent to [facility] in [facility]. Please call us  sooner if your symptoms get worse.  -TOBACCO USE DISORDER: Tobacco use disorder means you are still smoking, which can worsen your COPD. Reducing and eventually quitting smoking is crucial for better managing your COPD and preventing further flare-ups. We strongly advise you to stop smoking.  INSTRUCTIONS: Please follow up with us  if your symptoms worsen or if you have any concerns. Make sure to get your chest x-ray done as soon as possible.                       Contains text generated by [employer].                                 Contains text generated by [employer].
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
## 2024-06-16 ENCOUNTER — Telehealth: Payer: Self-pay | Admitting: *Deleted

## 2024-06-16 DIAGNOSIS — F411 Generalized anxiety disorder: Secondary | ICD-10-CM

## 2024-06-16 NOTE — Telephone Encounter (Unsigned)
 Copied from CRM [id number]. Topic: General - Other >> Jun 16, 2024  4:34 PM Adrianna P wrote: Reason for CRM: Patient called to see who she was referred to for a psychiatrist the only referral I see says expired, she is trying to get a refill on her medicine and was told by Dr. Elicia that to get a fill she needs a psychiatrist. Please call the patient at [phone]

## 2024-06-17 ENCOUNTER — Other Ambulatory Visit: Payer: Self-pay | Admitting: *Deleted

## 2024-06-17 MED ORDER — SERTRALINE HCL 50 MG PO TABS
50.0000 mg | ORAL_TABLET | Freq: Every day | ORAL | 2 refills | Status: DC
Start: 2024-06-17 — End: 2024-09-10

## 2024-06-17 NOTE — Telephone Encounter (Signed)
 She's requesting Ativan  for anxiety.  She has a lot of health-related anxiety and fear about lung cancer which she's undergoing screening for.  She also takes Zoloft  and has a few of these left.  I'll refill her Zoloft  today. I'm not prescribing Ativan . I recommended she make an appointment for a more detailed assessment and plan for her anxiety.  Meds ordered this encounter  Medications   sertraline  (ZOLOFT ) 50 MG tablet    Sig: Take 1 tablet (50 mg total) by mouth daily.    Dispense:  30 tablet    Refill:  2   Ozell Kung MD 06/17/2024, 10:58 AM

## 2024-06-17 NOTE — Telephone Encounter (Signed)
 [name] stated she will f/u on psych referral.

## 2024-06-17 NOTE — Telephone Encounter (Signed)
 Pt was called / informed of Zoloft  rx. And stated she would like to see psychiatry.

## 2024-06-23 ENCOUNTER — Ambulatory Visit: Payer: Self-pay | Admitting: Pulmonary Disease

## 2024-06-24 NOTE — Progress Notes (Signed)
 ATCx2, LVM. Sent a Mychart message to pt regarding her chest xray results, per Dr. Theophilus. NFN.

## 2024-06-25 ENCOUNTER — Ambulatory Visit (HOSPITAL_COMMUNITY)
Admission: RE | Admit: 2024-06-25 | Discharge: 2024-06-25 | Disposition: A | Discharge status: Home / Self Care | Source: Ambulatory Visit | Attending: Acute Care | Admitting: Acute Care

## 2024-06-25 DIAGNOSIS — Z87891 Personal history of nicotine dependence: Secondary | ICD-10-CM | POA: Diagnosis present

## 2024-06-25 DIAGNOSIS — F17210 Nicotine dependence, cigarettes, uncomplicated: Secondary | ICD-10-CM | POA: Diagnosis present

## 2024-06-25 DIAGNOSIS — Z122 Encounter for screening for malignant neoplasm of respiratory organs: Secondary | ICD-10-CM | POA: Insufficient documentation

## 2024-06-30 ENCOUNTER — Ambulatory Visit: Admitting: Internal Medicine

## 2024-07-02 ENCOUNTER — Telehealth: Payer: Self-pay | Admitting: *Deleted

## 2024-07-02 ENCOUNTER — Ambulatory Visit: Payer: Self-pay | Admitting: Student

## 2024-07-02 ENCOUNTER — Encounter: Payer: Self-pay | Admitting: *Deleted

## 2024-07-02 NOTE — Telephone Encounter (Signed)
 Called pt to inform her of the doctor's response - no answer; vm full, unable to leave a message.

## 2024-07-02 NOTE — Assessment & Plan Note (Deleted)
 Getting treatment for COPD exacerbation.  Denies worsening SOB, new cough or changes in sputum purulence.

## 2024-07-02 NOTE — Assessment & Plan Note (Deleted)
 No active SI/HI.

## 2024-07-02 NOTE — Telephone Encounter (Addendum)
 Call from pt who stated she went to the wrong building for her appt this afternoon @ 1345PM. Stated she's very shaky and needs a refill on Lorazepam  x 2 tabs. Stated she has an appt w/the psych in the am @ 0900am. Pt is requesting rx forone pill for tonight so she can sleep and one pill in the am until she sees psych. She's taking Zoloft  but stated it works better taking with Lorazepam . Also stated she had a seizure  last night; stated EMS told her it was  probably from being off Lorazepam . Stated she has been off Lorazepam  x several months. She has re-scheduled her appt to 8/27. Thanks
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
## 2024-07-03 ENCOUNTER — Ambulatory Visit (HOSPITAL_COMMUNITY): Payer: Self-pay | Admitting: Psychiatry

## 2024-07-06 NOTE — Telephone Encounter (Signed)
 This pt was resch   Provider Department Encounter # Center  07/08/2024 2:45 PM Harrie Bruckner, DO [facility] RES [id number] [facility]     Copied from CRM [id number]. Topic: General - Running Late >> Jul 02, 2024  1:44 PM Mercer PEDLAR wrote: Patient/patient representative is calling because they are running late for an appointment.

## 2024-07-07 ENCOUNTER — Encounter

## 2024-07-08 ENCOUNTER — Ambulatory Visit: Admitting: Student
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
## 2024-07-09 ENCOUNTER — Encounter (HOSPITAL_COMMUNITY): Payer: Self-pay | Admitting: Psychiatry

## 2024-07-09 ENCOUNTER — Ambulatory Visit (HOSPITAL_COMMUNITY): Payer: Self-pay | Admitting: Psychiatry

## 2024-07-09 DIAGNOSIS — F1420 Cocaine dependence, uncomplicated: Secondary | ICD-10-CM

## 2024-07-09 DIAGNOSIS — F419 Anxiety disorder, unspecified: Secondary | ICD-10-CM | POA: Diagnosis not present

## 2024-07-09 DIAGNOSIS — F32A Depression, unspecified: Secondary | ICD-10-CM

## 2024-07-09 DIAGNOSIS — F411 Generalized anxiety disorder: Secondary | ICD-10-CM

## 2024-07-09 DIAGNOSIS — Z8659 Personal history of other mental and behavioral disorders: Secondary | ICD-10-CM

## 2024-07-09 DIAGNOSIS — F1410 Cocaine abuse, uncomplicated: Secondary | ICD-10-CM

## 2024-07-09 NOTE — Patient Instructions (Signed)
 Thank you for attending your appointment today.  -- As you were not thinking clearly, we were not able to complete the evaluation today. We did not make any medication changes. -- Continue other medications as prescribed.  Please do not make any changes to medications without first discussing with your provider. If you are experiencing a psychiatric emergency, please call 911 or present to your nearest emergency department. Additional crisis, medication management, and therapy resources are included below.  [facility]  [address] [place], [place] [address] [phone] WALK-IN URGENT CARE 24/7 FOR ANYONE [address] [place], [place]  [phone] Fax: [phone] [email or web address] *Interpreters available *Accepts all insurance and uninsured for Urgent Care needs *Accepts Medicaid and uninsured for outpatient treatment (below)      ONLY FOR [facility]  Below:    Outpatient New Patient Assessment/Therapy Walk-ins:        Monday, Wednesday, and Thursday 8am until slots are full (first come, first served)                   New Patient Psychiatry/Medication Management        Monday-Friday 8am-11am (first come, first served)               For all walk-ins we ask that you arrive by 7:15am, because patients will be seen in the order of arrival.

## 2024-07-14 ENCOUNTER — Other Ambulatory Visit: Payer: Self-pay

## 2024-07-14 ENCOUNTER — Ambulatory Visit (HOSPITAL_COMMUNITY): Payer: Self-pay | Admitting: Psychiatry

## 2024-07-14 ENCOUNTER — Telehealth: Payer: Self-pay | Admitting: Acute Care

## 2024-07-14 ENCOUNTER — Other Ambulatory Visit: Payer: Self-pay | Admitting: Internal Medicine

## 2024-07-14 DIAGNOSIS — Z122 Encounter for screening for malignant neoplasm of respiratory organs: Secondary | ICD-10-CM

## 2024-07-14 DIAGNOSIS — J441 Chronic obstructive pulmonary disease with (acute) exacerbation: Secondary | ICD-10-CM

## 2024-07-14 DIAGNOSIS — Z87891 Personal history of nicotine dependence: Secondary | ICD-10-CM

## 2024-07-14 DIAGNOSIS — F17210 Nicotine dependence, cigarettes, uncomplicated: Secondary | ICD-10-CM

## 2024-07-14 MED ORDER — ALBUTEROL SULFATE (2.5 MG/3ML) 0.083% IN NEBU: 2.5000 mg | INHALATION_SOLUTION | RESPIRATORY_TRACT | 11 refills | Status: DC | PRN

## 2024-07-14 NOTE — Telephone Encounter (Signed)
 FYI Only or Action Required?: Action required by provider: medication refill request.  Patient is followed in Pulmonology for COPD, Asthma, last seen on 06/11/2024 by Mannam, Praveen, MD.

## 2024-07-14 NOTE — Telephone Encounter (Signed)
 Call report received:  IMPRESSION: 1. Lung-RADS 2, benign appearance or behavior. Continue annual screening with low-dose chest CT without contrast in 12 months. 2. New moderate superior T6 vertebral compression fracture. 3. Aortic Atherosclerosis (ICD10-I70.0) and Emphysema (ICD10-J43.9).   These results will be called to the ordering clinician or representative by the Radiologist Assistant, and communication documented in the PACS or [employer].     Electronically Signed   By: Selinda DELENA Blue M.D.   On: 07/10/2024 14:59
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
## 2024-07-14 NOTE — Telephone Encounter (Signed)
 LVM on home phone to review results.

## 2024-07-14 NOTE — Telephone Encounter (Signed)
 Copied from CRM [id number]. Topic: Clinical - Medication Refill >> Jul 14, 2024  1:28 PM Daniela A wrote: Medication: albuterol  (PROVENTIL ) (2.5 MG/3ML) 0.083% nebulizer solution  Has the patient contacted their pharmacy? No (Agent: If no, request that the patient contact the pharmacy for the refill. If patient does not wish to contact the pharmacy document the reason why and proceed with request.) (Agent: If yes, when and what did the pharmacy advise?)  This is the patient's preferred pharmacy:   [facility] [phone] - [place], [place] - [address] AT [facility] [address] [place] [address] Phone: [phone] Fax: [phone]  Is this the correct pharmacy for this prescription? Yes If no, delete pharmacy and type the correct one.   Has the prescription been filled recently? No  Is the patient out of the medication? Yes   Has the patient been seen for an appointment in the last year OR does the patient have an upcoming appointment? Yes  Can we respond through MyChart? Yes  Agent: Please be advised that Rx refills may take up to 3 business days. We ask that you follow-up with your pharmacy.

## 2024-07-14 NOTE — Telephone Encounter (Signed)
 Attempt to call to review results and notify of compression fracture. No answer and no VM set up.  Unable to leave a message.  Previous hx of compression fractures.

## 2024-07-15 ENCOUNTER — Ambulatory Visit: Admitting: Internal Medicine

## 2024-07-17 ENCOUNTER — Ambulatory Visit: Payer: Self-pay

## 2024-07-17 NOTE — Telephone Encounter (Signed)
 FYI Only or Action Required?: Action required by provider: referral request and update on patient condition.  Patient was last seen in primary care on 03/03/2024 by Kenn Pagan, DO.  Called Nurse Triage reporting Back Pain.  Symptoms began several weeks ago.  Interventions attempted: OTC medications: tylenol , ibuprofen .  Symptoms are: unchanged.  Triage Disposition: See HCP Within 4 Hours (Or PCP Triage)  Patient/caregiver understands and will follow disposition?: Yes  Copied from CRM #[id number]. Topic: Clinical - Red Word Triage >> Jul 17, 2024  4:39 PM Graeme ORN wrote: Red Word that prompted transfer to Nurse Triage: Terrible Back pain causing nausea.   ----------------------------------------------------------------------- From previous Reason for Contact - Referral Request: Did the patient discuss referral with their provider in the last year? No (If No - schedule appointment) (If Yes - send message)  Appointment offered? Yes  Type of order/referral and detailed reason for visit: Ortho - per results from other provider has fracture was supposed to send   Preference of office, provider, location:   If referral order, have you been seen by this specialty before?   (If Yes, this issue or another issue? When? Where?  Can we respond through MyChart? Reason for Disposition  [1] SEVERE back pain (e.g., excruciating, unable to do any normal activities) AND [2] not improved 2 hours after pain medicine  Answer Assessment - Initial Assessment Questions 1. ONSET: When did the pain begin? (e.g., minutes, hours, days)     3 weeks 2. LOCATION: Where does it hurt? (upper, mid or lower back)     T spine 3. SEVERITY: How bad is the pain?  (e.g., Scale 1-10; mild, moderate, or severe)     9/10 4. PATTERN: Is the pain constant? (e.g., yes, no; constant, intermittent)      constant 5. RADIATION: Does the pain shoot into your legs or somewhere else?     N/A 6. CAUSE:   What do you think is causing the back pain?      Recent diagnosis of compression fracture T5-T9 7. BACK OVERUSE:  Any recent lifting of heavy objects, strenuous work or exercise?     N/A 8. MEDICINES: What have you taken so far for the pain? (e.g., nothing, acetaminophen , NSAIDS)     Tylenol  and advil  9. NEUROLOGIC SYMPTOMS: Do you have any weakness, numbness, or problems with bowel/bladder control?     N/A 10. OTHER SYMPTOMS: Do you have any other symptoms? (e.g., fever, abdomen pain, burning with urination, blood in urine)       N/A 11. PREGNANCY: Is there any chance you are pregnant? When was your last menstrual period?       N/A  Protocols used: Back Pain-A-AH

## 2024-07-20 ENCOUNTER — Ambulatory Visit: Payer: Self-pay | Admitting: Student

## 2024-07-20 NOTE — Telephone Encounter (Signed)
 Pt has an appt today 9/8 with Dr Kandis.
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
## 2024-07-22 ENCOUNTER — Telehealth: Payer: Self-pay | Admitting: Internal Medicine

## 2024-07-22 ENCOUNTER — Ambulatory Visit

## 2024-07-22 DIAGNOSIS — J441 Chronic obstructive pulmonary disease with (acute) exacerbation: Secondary | ICD-10-CM

## 2024-07-22 MED ORDER — ALBUTEROL SULFATE (2.5 MG/3ML) 0.083% IN NEBU: 2.5000 mg | INHALATION_SOLUTION | RESPIRATORY_TRACT | 11 refills | Status: DC | PRN

## 2024-07-22 NOTE — Telephone Encounter (Signed)
30 day supply sent to pharmacy.  

## 2024-07-22 NOTE — Telephone Encounter (Signed)
 Copied from CRM [id number]. Topic: Clinical - Medication Refill >> Jul 22, 2024  1:05 PM Essie A wrote: Medication: ipratropium (ATROVENT ) 0.02 % nebulizer solution  Has the patient contacted their pharmacy? No, because she wants a 60 or 90 day supply (Agent: If no, request that the patient contact the pharmacy for the refill. If patient does not wish to contact the pharmacy document the reason why and proceed with request.) (Agent: If yes, when and what did the pharmacy advise?)  This is the patient's preferred pharmacy:  [facility] [phone] - [place], [place] - [address] AT [facility] [address] [place] [address] Phone: [phone] Fax: [phone]  Is this the correct pharmacy for this prescription? Yes If no, delete pharmacy and type the correct one.   Has the prescription been filled recently? Yes  Is the patient out of the medication? Yes, needs it called in today and let patient know if it can be filled today  Has the patient been seen for an appointment in the last year OR does the patient have an upcoming appointment? Yes  Can we respond through MyChart? No  Agent: Please be advised that Rx refills may take up to 3 business days. We ask that you follow-up with your pharmacy.

## 2024-07-29 ENCOUNTER — Telehealth: Payer: Self-pay | Admitting: *Deleted

## 2024-07-29 ENCOUNTER — Ambulatory Visit: Payer: Self-pay | Admitting: Student

## 2024-07-29 ENCOUNTER — Telehealth: Payer: Self-pay

## 2024-07-29 VITALS — BP 134/97 | HR 74 | Temp 98.2°F | Ht 60.0 in | Wt 93.6 lb

## 2024-07-29 DIAGNOSIS — M8080XA Other osteoporosis with current pathological fracture, unspecified site, initial encounter for fracture: Secondary | ICD-10-CM

## 2024-07-29 DIAGNOSIS — G894 Chronic pain syndrome: Secondary | ICD-10-CM

## 2024-07-29 DIAGNOSIS — Z0000 Encounter for general adult medical examination without abnormal findings: Secondary | ICD-10-CM

## 2024-07-29 DIAGNOSIS — Z0100 Encounter for examination of eyes and vision without abnormal findings: Secondary | ICD-10-CM

## 2024-07-29 DIAGNOSIS — S22050D Wedge compression fracture of T5-T6 vertebra, subsequent encounter for fracture with routine healing: Secondary | ICD-10-CM | POA: Diagnosis present

## 2024-07-29 DIAGNOSIS — S22050A Wedge compression fracture of T5-T6 vertebra, initial encounter for closed fracture: Secondary | ICD-10-CM

## 2024-07-29 MED ORDER — LIDOCAINE 5 % EX PTCH: 1.0000 | MEDICATED_PATCH | CUTANEOUS | 0 refills | Status: AC

## 2024-07-29 NOTE — Telephone Encounter (Signed)
 Prior Authorization for patient (Lidocaine  5% patches) came through on cover my med was submitted with last office notes awaiting approval or denial.  XZB:[id number]

## 2024-07-29 NOTE — Telephone Encounter (Signed)
 Copied from CRM [id number]. Topic: Clinical - Lab/Test Results >> Jul 28, 2024  4:37 PM Graeme ORN wrote: Reason for CRM: Patient called. Wanted to make sure someone has reviewed the letters and results for CT scan. Is recommended she see ortho for fracture. Would like to know if someone is going to set that up or if she needs to call and who she should call. Thank You

## 2024-07-29 NOTE — Telephone Encounter (Signed)
 [name] (Key: [id number]) PA Case ID #: [id number] Rx #: [id number] Need Help? Call us  at [phone] Outcome Approved today by [facility]  Medicaid [place] Case: [id number], Status: Approved, Coverage Starts on: 07/29/2024 12:00:00 AM, Coverage Ends on: 07/29/2025 12:00:00 AM. Effective Date: 07/29/2024 Authorization Expiration Date: 07/29/2025 Drug Lidocaine  5% patches ePA cloud logo Form [employer] [place]  Medicaid Electronic PA Form [phone] NCPDP) Original Claim Info 75 CALL [phone] ORSUBMIT PA TO [email or web address] DS O/R, USE PAMC [id number] DRUG REQUIRES PRIOR AUTHORIZATION

## 2024-07-29 NOTE — Patient Instructions (Addendum)
 Thank you, Ms.[name] for allowing us  to provide your care today. Today we discussed   A compression fracture at the middle of the back (T6 vertebra) is a break in one of the bones of your spine. Most people recover well with conservative (non-surgical) treatment. Here's what to expect and what to watch for:  Managing Your Fracture:  Pain Control: Most people need medicine to help with pain. Options include acetaminophen  (Tylenol ), nonsteroidal anti-inflammatory drugs (NSAIDs like ibuprofen ), or sometimes stronger pain medicines for a short time. I AM REFERRING YOU TO THE PAIN CLINIC   Activity: Try to stay as active as you can. Early movement helps you heal and prevents problems from staying in bed too long. You may need to rest for a short time, but avoid long periods in bed  Bone Health: Compression fractures are often related to osteoporosis (weak bones). Your doctor may suggest calcium (1000-1200 mg/day) and vitamin D  (600-800 IU/day) supplements. PLEASE PICK THIS UP OVER THE COUNTER AT LOW COST   Physical Therapy: Gentle exercises and physical therapy can help you regain strength, improve posture, and reduce pain. I AM REFERRING YOU TO THE PHYSICAL THERAPIST   GO TO THE ED OR CALL US  IF:  New numbness, tingling, or weakness in your legs  Loss of control over your bladder or bowels  Severe, worsening pain that does not improve with medicine  Fever, chills, or signs of infection  Trouble breathing or chest pain These symptoms could mean a more serious problem, such as nerve injury or other complications, and need urgent medical attention  Most people with a T6 compression fracture recover with rest, pain control, and gentle movement. Most orthopedic surgeons do not operate unless there are warning signs.   Referrals: - Pain clinic and physical therapy - I can still put in the order for ortho, but I want to set the expectation this is usually treated with supportive measures like  physical therapy and pain management, both of which I am referring you to! Please follow-up in: 4 weeks     We look forward to seeing you next time. Please call our clinic at [phone] if you have any questions or concerns. The best time to call is Monday-Friday from 9am-4pm, but there is someone available 24/7. If after hours or the weekend, call the main hospital number and ask for the Internal Medicine Resident On-Call. If you need medication refills, please notify your pharmacy one week in advance and they will send us  a request.   Thank you for letting us  take part in your care. Wishing you the best!  Elnora Ip, MD 07/29/2024, 2:44 PM [facility]
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
## 2024-07-30 NOTE — Progress Notes (Signed)
 Virtual Visit via Telephone Note   I connected with [name] on 05/20/24 at  2:30 PM EDT by telephone and verified that I am speaking with the correct person using two identifiers.   Location: Patient: at home Provider:[address] [place], [place], [address]    I discussed the limitations, risks, security and privacy concerns of performing an evaluation and management service by telephone and the availability of in person appointments. I also discussed with the patient that there may be a patient responsible charge related to this service. The patient expressed understanding and agreed to proceed.     Shared Decision Making Visit Lung Cancer Screening Program [phone])     Eligibility: Age 53 y.o. Pack Years Smoking History Calculation 70 (# packs/per year x # years smoked) Recent History of coughing up blood  no Unexplained weight loss? Yes (relates it to stress) ( >Than 15 pounds within the last 6 months ) Prior History Lung / other cancer no (Diagnosis within the last 5 years already requiring surveillance chest CT Scans). Smoking Status Current Smoker Former Smokers: Years since quit: n/a       Quit Date: n/a   Visit Components: Discussion included one or more decision making aids. yes Discussion included risk/benefits of screening. yes Discussion included potential follow up diagnostic testing for abnormal scans. yes Discussion included meaning and risk of over diagnosis. yes Discussion included meaning and risk of False Positives. yes Discussion included meaning of total radiation exposure. yes   Counseling Included: Importance of adherence to annual lung cancer LDCT screening. yes Impact of comorbidities on ability to participate in the program. yes Ability and willingness to under diagnostic treatment. yes   Smoking Cessation Counseling: Current Smokers:  Discussed importance of smoking cessation. yes Information about tobacco cessation classes and  interventions provided to patient. yes Patient provided with ticket for LDCT Scan. no Symptomatic Patient. no             Counseling(Intermediate counseling: > three minutes) [address]406 Diagnosis Code: Tobacco Use Z72.0 Asymptomatic Patient yes        Counseled patient 4 minutes regarding tobacco use.   Former Smokers:  Discussed the importance of maintaining cigarette abstinence. yes Diagnosis Code: Personal History of Nicotine  Dependence. S12.108 Information about tobacco cessation classes and interventions provided to patient. Yes Patient provided with ticket for LDCT Scan. no Written Order for Lung Cancer Screening with LDCT placed in Epic. Yes (CT Chest Lung Cancer Screening Low Dose W/O CM) [id number] Z12.2-Screening of respiratory organs Z87.891-Personal history of nicotine  dependence     Laneta Speaks, RN

## 2024-07-31 DIAGNOSIS — M810 Age-related osteoporosis without current pathological fracture: Secondary | ICD-10-CM | POA: Insufficient documentation

## 2024-07-31 NOTE — Assessment & Plan Note (Addendum)
 New pathological, non traumatic fracture.  Discussed ca and vit D supplementation to have over the counter No DEXA scan needed at this time

## 2024-07-31 NOTE — Assessment & Plan Note (Signed)
 Referral to ophthalmologist for routine eye exam

## 2024-07-31 NOTE — Assessment & Plan Note (Signed)
 Patient with chronic pain from multiple fractures. Initially tried to separate chronic pain from acute pain from compression fracture and it was difficult to differentiate and everything hurts. I think she has organic reasons to be in pain and needs management. I think Ms. Koenig would be best care for and monitored at a pain clinic where there is consistency between providers. - Pain clinic referral sent

## 2024-07-31 NOTE — Assessment & Plan Note (Addendum)
 Patient with nontraumatic T6 compression fracture with moderate to severe pain currently being managed with Tylenol  and diclofenac  gel without complete relieve. No spasms or decreased strength noted. Patient denies neruopathic-like pain to this new pain.  Initially, patient requested orthopedic surgery referral. We discussed how this is usually managed with supportive treatment unless she developed red flag symptoms, which she currently does not show.   Prior history of drug use, declined recent use. Currently being managed for anxiety and depression. She is currently able to perform activities of daily living.  - Referral to Physical therapy - Tylenol  1000 mg TID - Lidocaine  patches sent - Diclofenac  gel

## 2024-08-04 ENCOUNTER — Telehealth: Payer: Self-pay

## 2024-08-04 NOTE — Telephone Encounter (Addendum)
 COPD : RN Telephone Call   Patient ID: [name], female    DOB: 05/09/1971  Age: 53 y.o. MRN: [id number]  [name] is a 53 y.o. who was called about COPD management.   Patient reports that they are taking the following medications for COPD: ( FLONASE  /SYMIBCORT / PREDINSONE / ALBUTEROL  )   Patient reports they DO  have a prior pulmonologist who they follow: DR DARLEAN   Please document smoking status, how many packs? Ready to quit ?  - Smoking Hx: She currently smokes 10  per day. READY TO QUIT    mMRC (Modified [employer]) Dyspnea Scale   Ask the following questions and please check the box that describes the most.   Choose ONE>   [x]  Grade 0: I only get breathless with strenuous exercise   [x]  Grade 1: I get short of breath when hurry on level ground or walking up a slight hill   []  Grade 2: On level Ground, I walk slower than people of the same age because of breathlessness or have to stop for breath when walking my own pace   []  Grade 3: I Stop for breath after walking about 100 yards or after a few minutes on level ground   []  Grade 4: I am too breathless to leave the house, or I am breathless when dressing    CAT (COPD Assessment Test)   Please ask each of the following questions and select between 0-5 based on what describes the patient most accurately.  On a scale 0 to 5, do you never cough or cough all the time: 1  2 3 2   0 (I am not limited doing any activities at home) 0 (I am confident leaving my home despite my lung condition) 5 (I don't sleep soundly because of my lung condition) On a scale 0 to 5, how would you describe your energy?: 2  Total: 15

## 2024-08-11 NOTE — Progress Notes (Signed)
 Internal Medicine Clinic Attending  Case discussed with the resident at the time of the visit.  We reviewed the resident's history and exam and pertinent patient test results.  I agree with the assessment, diagnosis, and plan of care documented in the resident's note.

## 2024-08-25 ENCOUNTER — Ambulatory Visit: Payer: Self-pay | Admitting: Internal Medicine

## 2024-08-25 NOTE — Telephone Encounter (Signed)
 FYI Only or Action Required?: Action required by provider: clinical question for provider.  Patient is followed in Pulmonology for COPD, last seen on 06/11/2024 by Mannam, Praveen, MD.  Called Nurse Triage reporting Shortness of Breath.  Symptoms began 4-5 days ago.  Interventions attempted: Rescue inhaler, Maintenance inhaler, Nebulizer treatments, and Home oxygen  use.  Symptoms are: unchanged.  Triage Disposition: No disposition on file.  Patient/caregiver understands and will follow disposition?:   E2C2 Pulmonary Triage - Initial Assessment Questions "Chief Complaint (e.g., cough, sob, wheezing, fever, chills, sweat or additional symptoms) *Go to specific symptom protocol after initial questions. Patient reports increased shortness of breath and cough for the last 4-5 days. No CP or wheezing. Patient states SOB is mild and intermittent. Patient requesting antibiotics and prednisone . States she is unable to come to the office due to transportation concerns. Patient also states she is needing a refill on her albuterol  inhaler.   "How long have symptoms been present?" 4-5 days  Have you tested for COVID or Flu? Note: If not, ask patient if a home test can be taken. If so, instruct patient to call back for positive results. No  MEDICINES:   "Have you used any OTC meds to help with symptoms?" No If yes, ask "What medications?" NA  "Have you used your inhalers/maintenance medication?" Yes If yes, "What medications?" Symbicort  Albuterol  inhaler Albuterol  nebulizer  If inhaler, ask "How many puffs and how often?" Note: Review instructions on medication in the chart. Symbicort  2 puffs BID Albuterol  inhaler 2 puffs Q6H as needed Albuterol  nebulizer Q4h PRN  OXYGEN : "Do you wear supplemental oxygen ?" Yes If yes, "How many liters are you supposed to use?" Uses it as needed and uses 2L  "Do you monitor your oxygen  levels?" Yes If yes, What is your reading (oxygen  level)  today? 93%  What is your usual oxygen  saturation reading?  (Note: Pulmonary O2 sats should be 90% or greater) 88-92%   Copied from CRM #[id number]. Topic: Clinical - Red Word Triage >> Aug 25, 2024  1:05 PM Rilla B wrote: [facility] that prompted transfer to Nurse Triage: rattling in chest, coughing, shortness of breath Reason for Disposition  [1] Longstanding difficulty breathing (e.g., CHF, COPD, emphysema) AND [2] WORSE than normal  Answer Assessment - Initial Assessment Questions 6. CARDIAC HISTORY: Do you have any history of heart disease? (e.g., heart attack, angina, bypass surgery, angioplasty)      no 7. LUNG HISTORY: Do you have any history of lung disease?  (e.g., pulmonary embolus, asthma, emphysema)     yes 12. TRAVEL: Have you traveled out of the country in the last month? (e.g., travel history, exposures)       no  Protocols used: Breathing Difficulty-A-AH

## 2024-08-26 ENCOUNTER — Ambulatory Visit: Admitting: Student

## 2024-08-26 ENCOUNTER — Telehealth: Payer: Self-pay | Admitting: Student

## 2024-08-26 MED ORDER — PREDNISONE 10 MG PO TABS: ORAL_TABLET | ORAL | 0 refills | Status: DC

## 2024-08-26 MED ORDER — AZITHROMYCIN 250 MG PO TABS: 250.0000 mg | ORAL_TABLET | ORAL | 0 refills | Status: DC

## 2024-08-26 NOTE — Telephone Encounter (Signed)
 Per Dr. Darlean:  Zpak/Prednisone  10 mg take 4 each am x 2 days, 2 each am x 2 days, 1 each am x 2 days and stop I called pt and there was no answer, her mailbox was full   I have sent the rxs to her preferred pharm  Will try calling her again later

## 2024-08-26 NOTE — Telephone Encounter (Signed)
 Copied from CRM #[id number]. Topic: Clinical - Prescription Issue >> Aug 26, 2024 10:48 AM Nathanel DEL wrote: Reason for CRM: pt called back to ask if her request for abx and Prednisone  had been done Pt ginven info and is very happy and thankful. Advised pt if her sx worsen, or she does not get better, please call back and speak w/ a nurse.

## 2024-08-26 NOTE — Telephone Encounter (Signed)
 This RN first attempt to contact patient and advise of RX for ABX that was sent today. No answer, unable to leave vmail  Copied from CRM #[id number]. Topic: Clinical - Red Word Triage >> Aug 26, 2024 10:40 AM [name] wrote: Red Word that prompted transfer to Nurse Triage: pt calling for info about an abx

## 2024-08-26 NOTE — Telephone Encounter (Signed)
 Pt has already been notified of rxs sent today- see the other phone note dated today

## 2024-08-26 NOTE — Telephone Encounter (Signed)
 [name]

## 2024-08-26 NOTE — Addendum Note (Signed)
 Addended by: Sloka Volante M on: 08/26/2024 09:31 AM   Modules accepted: Orders

## 2024-09-01 ENCOUNTER — Encounter: Payer: Self-pay | Admitting: Student

## 2024-09-01 ENCOUNTER — Ambulatory Visit (INDEPENDENT_AMBULATORY_CARE_PROVIDER_SITE_OTHER): Admitting: Student

## 2024-09-01 VITALS — BP 131/81 | HR 89 | Temp 98.3°F | Ht 60.0 in | Wt 91.4 lb

## 2024-09-01 DIAGNOSIS — E039 Hypothyroidism, unspecified: Secondary | ICD-10-CM

## 2024-09-01 DIAGNOSIS — F17210 Nicotine dependence, cigarettes, uncomplicated: Secondary | ICD-10-CM

## 2024-09-01 DIAGNOSIS — Z23 Encounter for immunization: Secondary | ICD-10-CM | POA: Diagnosis present

## 2024-09-01 DIAGNOSIS — G894 Chronic pain syndrome: Secondary | ICD-10-CM | POA: Diagnosis not present

## 2024-09-01 DIAGNOSIS — F411 Generalized anxiety disorder: Secondary | ICD-10-CM | POA: Diagnosis present

## 2024-09-01 DIAGNOSIS — Z1231 Encounter for screening mammogram for malignant neoplasm of breast: Secondary | ICD-10-CM | POA: Diagnosis not present

## 2024-09-01 MED ORDER — HYDROXYZINE HCL 10 MG PO TABS
10.0000 mg | ORAL_TABLET | Freq: Every evening | ORAL | 3 refills | Status: DC | PRN
Start: 2024-09-01 — End: 2024-09-10

## 2024-09-01 NOTE — Patient Instructions (Addendum)
 Thank you, [name] for allowing us  to provide your care today. Today we discussed:  -Make sure to go to your psychiatry appt on 10/24 and 10/30 -Make sure to go to your lung doctor appt on 10/29 -I will check on pain management center -Plan for blood work with lab-only visit to check thyroid    Follow up: 3-4 months   Should you have any questions or concerns please call the internal medicine clinic at [phone].    [name], D.O. [facility]
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
## 2024-09-01 NOTE — Assessment & Plan Note (Signed)
  Orders:   TSH Rfx on Abnormal to Free T4; Future

## 2024-09-01 NOTE — Assessment & Plan Note (Signed)
  Orders:   Ambulatory referral to Pain Clinic

## 2024-09-01 NOTE — Assessment & Plan Note (Addendum)
  Orders:   hydrOXYzine  (ATARAX ) 10 MG tablet; Take 1 tablet (10 mg total) by mouth at bedtime as needed for anxiety.

## 2024-09-02 NOTE — Assessment & Plan Note (Addendum)
 Still reports smoking cigarettes.  States she needs to quit.  Is aware of tobacco use and her COPD. Declined medication and NRT at this time. Continue counseling at future OV.   Time spent for smoking/tobacco cessation counseling: 4 minutes

## 2024-09-03 NOTE — Progress Notes (Signed)
 Internal Medicine Clinic Attending  Case discussed with the resident at the time of the visit.  We reviewed the resident's history and exam and pertinent patient test results.  I agree with the assessment, diagnosis, and plan of care documented in the resident's note.

## 2024-09-04 ENCOUNTER — Encounter (HOSPITAL_COMMUNITY): Payer: Self-pay

## 2024-09-04 ENCOUNTER — Ambulatory Visit (HOSPITAL_COMMUNITY): Payer: Self-pay | Admitting: Mental Health

## 2024-09-09 ENCOUNTER — Encounter

## 2024-09-09 ENCOUNTER — Ambulatory Visit: Admitting: Internal Medicine

## 2024-09-09 DIAGNOSIS — J449 Chronic obstructive pulmonary disease, unspecified: Secondary | ICD-10-CM
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
## 2024-09-10 ENCOUNTER — Telehealth (INDEPENDENT_AMBULATORY_CARE_PROVIDER_SITE_OTHER): Payer: Self-pay | Admitting: Psychiatry

## 2024-09-10 ENCOUNTER — Encounter (HOSPITAL_COMMUNITY): Payer: Self-pay | Admitting: Psychiatry

## 2024-09-10 DIAGNOSIS — F419 Anxiety disorder, unspecified: Secondary | ICD-10-CM | POA: Diagnosis not present

## 2024-09-10 DIAGNOSIS — F1420 Cocaine dependence, uncomplicated: Secondary | ICD-10-CM

## 2024-09-10 DIAGNOSIS — F1210 Cannabis abuse, uncomplicated: Secondary | ICD-10-CM

## 2024-09-10 DIAGNOSIS — F32A Depression, unspecified: Secondary | ICD-10-CM | POA: Diagnosis not present

## 2024-09-10 MED ORDER — SERTRALINE HCL 50 MG PO TABS: 25.0000 mg | ORAL_TABLET | Freq: Every day | ORAL | 2 refills | Status: AC

## 2024-09-10 MED ORDER — HYDROXYZINE HCL 10 MG PO TABS
10.0000 mg | ORAL_TABLET | Freq: Two times a day (BID) | ORAL | 2 refills | Status: AC | PRN
Start: 2024-09-10 — End: 2024-12-09

## 2024-09-10 NOTE — Patient Instructions (Signed)
 Thank you for attending your appointment today.  -- START hydroxyzine  10-20 mg twice daily as needed for anxiety/sleep -- Continue other medications as prescribed.  Please do not make any changes to medications without first discussing with your provider. If you are experiencing a psychiatric emergency, please call 911 or present to your nearest emergency department. Additional crisis, medication management, and therapy resources are included below.  [facility]  [address], [place], [place] [address] [phone] WALK-IN URGENT CARE 24/7 FOR ANYONE [address] [place], [place]  [phone] Fax: [phone] [email or web address] *Interpreters available *Accepts all insurance and uninsured for Urgent Care needs *Accepts Medicaid and uninsured for outpatient treatment (below)      ONLY FOR [facility]  Below:    Outpatient New Patient Assessment/Therapy Walk-ins:        Monday, Wednesday, and Thursday 8am until slots are full (first come, first served)                   New Patient Psychiatry/Medication Management        Monday-Friday 8am-11am (first come, first served)               For all walk-ins we ask that you arrive by 7:15am, because patients will be seen in the order of arrival.

## 2024-09-15 ENCOUNTER — Ambulatory Visit (HOSPITAL_COMMUNITY): Admitting: Mental Health

## 2024-09-15 ENCOUNTER — Encounter (HOSPITAL_COMMUNITY): Payer: Self-pay

## 2024-10-05 ENCOUNTER — Other Ambulatory Visit: Payer: Self-pay | Admitting: Student

## 2024-10-05 DIAGNOSIS — E039 Hypothyroidism, unspecified: Secondary | ICD-10-CM

## 2024-10-05 NOTE — Telephone Encounter (Signed)
 Medication sent to pharmacy

## 2024-10-07 ENCOUNTER — Other Ambulatory Visit: Payer: Self-pay | Admitting: Internal Medicine

## 2024-10-07 ENCOUNTER — Telehealth: Payer: Self-pay | Admitting: Internal Medicine

## 2024-10-07 MED ORDER — AZITHROMYCIN 250 MG PO TABS: 250.0000 mg | ORAL_TABLET | ORAL | 0 refills | Status: DC

## 2024-10-07 MED ORDER — PREDNISONE 10 MG PO TABS: ORAL_TABLET | ORAL | 0 refills | Status: DC

## 2024-10-07 NOTE — Telephone Encounter (Signed)
 Melinda Ozell NOVAK, MD to Me      10/07/24  4:33 PM Zpak and Prednisone  10 mg take  4 each am x 2 days,   2 each am x 2 days,  1 each am x 2 days and stop  I doubt we'd be open still by the time she gets here and best short term rx is go back on her neb up to qid to offset the lack of breztri  and refer to pharmacy team for alternatives on her plan   I called and spoke with the pt and notified of response from MW. She verbalized understanding. Routing to pharm team per MW to find out about breztri /symbicort  alternatives.

## 2024-10-07 NOTE — Telephone Encounter (Signed)
 Patient is requesting inhalers as well as antibiotics for her shortness of breath.
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
## 2024-10-12 ENCOUNTER — Other Ambulatory Visit (HOSPITAL_COMMUNITY): Payer: Self-pay

## 2024-10-13 NOTE — Telephone Encounter (Signed)
 Per pharm team-   Miller, Juliana M, CPhT to Me      10/12/24  3:03 PM Brand Symbicort  is covered by patients plan.   Called pt and no answer, unable to leave msg. Will call back.
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
## 2024-10-29 ENCOUNTER — Ambulatory Visit: Payer: Self-pay

## 2024-10-29 ENCOUNTER — Encounter (HOSPITAL_COMMUNITY)
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
## 2024-10-29 NOTE — Telephone Encounter (Signed)
 Pt has an appt tomorrow 12/19 with Dr Harrie.

## 2024-10-30 ENCOUNTER — Ambulatory Visit: Payer: Self-pay | Admitting: Student

## 2024-11-09 ENCOUNTER — Other Ambulatory Visit: Payer: Self-pay | Admitting: Internal Medicine

## 2024-11-10 ENCOUNTER — Telehealth: Payer: Self-pay

## 2024-11-10 NOTE — Telephone Encounter (Signed)
 Copied from CRM [id number]. Topic: Clinical - Prescription Issue >> Nov 10, 2024  1:18 PM Rilla B wrote: Reason for CRM: Patient states she needs a refill on her Breztri . I do see refill from yesterday, 12/29-med refused? She states Dr Darlean does give her a couple samples that she picks up from the office and she needs to pick them up today. Please call patient to advise @ [phone]   ----------------------------------------------------------------------- From previous Reason for Contact - Medication Refill: Medication:   Has the patient contacted their pharmacy?   (Agent: If no, request that the patient contact the pharmacy for the refill. If patient does not wish to contact the pharmacy document the reason why and proceed with request.) (Agent: If yes, when and what did the pharmacy advise?)  This is the patient's preferred pharmacy:  [facility] [phone] - [place], [place] - [address] AT [facility] [address] [place] [address] Phone: [phone] Fax: [phone]  [id number] [place], [place] - [address] AT [facility] OF [address] [place] [address] Phone: [phone] Fax: [phone]  [facility] [address] [place] [address] Phone: [phone] Fax: [phone] Is this the correct pharmacy for this prescription?  If no, delete pharmacy and type the correct one.  Has the prescription been filled recently?  Is the patient out of the medication?  Has the patient been seen for an appointment in the last year OR does the patient have an upcoming appointment?  Can we respond through MyChart?  Agent: Please be advised that Rx refills may take up to 3 business days. We ask that you follow-up with your pharmacy.    Called and spoke with the pt and advised on notes from encounter 11/26 how insurance prefers symbicort . Pt  states she does not like that inhaler and prefers Breztri . Pt was requesting samples, she is not in the donut hole right now as she only pays $4 copay. I advised pt this is a good price. Dr. Theophilus is out.  She is requesting this urgently as she said she needs it now. Dr. Darlean can you specify if okay for pt to continue on Breztri 

## 2024-11-11 ENCOUNTER — Ambulatory Visit: Payer: Self-pay

## 2024-11-11 ENCOUNTER — Other Ambulatory Visit: Payer: Self-pay | Admitting: Internal Medicine

## 2024-11-11 DIAGNOSIS — J449 Chronic obstructive pulmonary disease, unspecified: Secondary | ICD-10-CM

## 2024-11-11 MED ORDER — BREZTRI AEROSPHERE 160-9-4.8 MCG/ACT IN AERO: 2.0000 | INHALATION_SPRAY | Freq: Two times a day (BID) | RESPIRATORY_TRACT | 0 refills | Status: DC

## 2024-11-11 NOTE — Telephone Encounter (Signed)
 Reason for Disposition  [1] Prescription refill request for ESSENTIAL medicine (i.e., likelihood of harm to patient if not taken) AND [2] triager unable to refill per department policy  Answer Assessment - Initial Assessment Questions 1. REASON FOR CALL or QUESTION: What is your reason for calling today? or How can I best     Patient is calling in stating that she is unable to pick up her Breztri  from the pharmacy. She states that she is out of medication and cannot go throughout the holiday without it.   2. CALLER: Document the source of call. (e.g., laboratory staff, caregiver or patient).     Patient  Protocols used: PCP Call - No Triage-A-AH, Medication Refill and Renewal Call-A-AH

## 2024-11-11 NOTE — Telephone Encounter (Signed)
 Breztri  refill provided for 1 month, needs to keep follow up.

## 2024-11-11 NOTE — Telephone Encounter (Signed)
 Called pt again and still no answer or option to leave msg.

## 2024-11-11 NOTE — Telephone Encounter (Signed)
 ATC x1. Mailbox is full so unable to leave vm

## 2024-11-11 NOTE — Telephone Encounter (Signed)
 FYI Only or Action Required?: Action required by provider: medication refill request.  Patient is followed in Pulmonology for , last seen on 06/11/2024 by Mannam, Praveen, MD.  Called Nurse Triage reporting Medication Refill.  Symptoms began  .  Interventions attempted: Nothing.  Symptoms are: unchanged.  Triage Disposition: Call PCP Now  Patient/caregiver understands and will follow disposition?: Yes  Patient calling in regarding Breztri  inhaler. States that she is unable to fill prescription at the pharmacy and needs this medication as she is out completely.

## 2024-11-13 MED ORDER — BREZTRI AEROSPHERE 160-9-4.8 MCG/ACT IN AERO: INHALATION_SPRAY | RESPIRATORY_TRACT | Status: DC

## 2024-11-13 MED ORDER — BREZTRI AEROSPHERE 160-9-4.8 MCG/ACT IN AERO: 2.0000 | INHALATION_SPRAY | Freq: Two times a day (BID) | RESPIRATORY_TRACT | 0 refills | Status: DC

## 2024-11-13 NOTE — Addendum Note (Signed)
 Addended byBETHA FRIES, Sahian Kerney A on: 11/13/2024 01:03 PM   Modules accepted: Orders

## 2024-11-13 NOTE — Telephone Encounter (Signed)
 Atc x1. Unable to lvm as mb is full

## 2024-11-13 NOTE — Telephone Encounter (Signed)
 Copied from CRM [id number]. Topic: Clinical - Prescription Issue >> Nov 13, 2024 11:13 AM Devaughn RAMAN wrote: Reason for CRM: Pt called and stated her father is at the pharmacy trying to get her medication, and the medication stated pt does not need the Breztri . Pt stated the Symbicort  does not help and the Breztri  works better then the Symbicort . Pt stated she really needs the Breztri  and she would like to speak with someone regarding this. Pt is very irritate and cursing using foul language. >> Nov 13, 2024 12:13 PM Russell PARAS wrote: Pt is contacting clinic regarding her request for refill of Breztri , NOT Symbicort , as Symbicort  does not work. She is requesting either refills be sent to [facility] on [address], or pick up samples from clinic. She is returning several calls from [place] and Felida concerning this issue. Contacted [facility], nurse was not avail.  Requested call back asap CB# [phone]    Called and spoke with the pt and advised of Dr. Darlean note.  One breztri  inhaler sent to pharmacy for pt over the wknd.  2 samples have been placed up front. NFN

## 2024-11-16 NOTE — Telephone Encounter (Signed)
 ATC patient x2.  VM is full.  Sent mychart message to patient since we are unable to reach you by phone.  Script has already been sent to pharmacy.  She has a f/u on 11/18/24 and PFT scheduled.

## 2024-11-16 NOTE — Telephone Encounter (Signed)
 ATC home # listed, [phone] (H), reached a fast busy signal.

## 2024-11-18 ENCOUNTER — Ambulatory Visit: Admitting: Internal Medicine

## 2024-11-18 ENCOUNTER — Encounter: Payer: Self-pay | Admitting: Internal Medicine

## 2024-11-18 ENCOUNTER — Other Ambulatory Visit: Payer: Self-pay

## 2024-11-18 ENCOUNTER — Ambulatory Visit (INDEPENDENT_AMBULATORY_CARE_PROVIDER_SITE_OTHER)

## 2024-11-18 VITALS — BP 94/68 | HR 85 | Ht 59.0 in | Wt 91.0 lb

## 2024-11-18 DIAGNOSIS — J9611 Chronic respiratory failure with hypoxia: Secondary | ICD-10-CM

## 2024-11-18 DIAGNOSIS — R0609 Other forms of dyspnea: Secondary | ICD-10-CM

## 2024-11-18 DIAGNOSIS — J449 Chronic obstructive pulmonary disease, unspecified: Secondary | ICD-10-CM

## 2024-11-18 DIAGNOSIS — F17210 Nicotine dependence, cigarettes, uncomplicated: Secondary | ICD-10-CM

## 2024-11-18 LAB — PULMONARY FUNCTION TEST
DL/VA % pred: 64 %
DL/VA: 2.87 ml/min/mmHg/L
DLCO unc % pred: 69 %
DLCO unc: 12.13 ml/min/mmHg
FEF 25-75 Post: 0.56 L/s
FEF 25-75 Pre: 0.47 L/s
FEF2575-%Change-Post: 17 %
FEF2575-%Pred-Post: 23 %
FEF2575-%Pred-Pre: 19 %
FEV1-%Change-Post: 5 %
FEV1-%Pred-Post: 43 %
FEV1-%Pred-Pre: 40 %
FEV1-Post: 0.99 L
FEV1-Pre: 0.94 L
FEV1FVC-%Change-Post: -6 %
FEV1FVC-%Pred-Pre: 55 %
FEV6-%Change-Post: 15 %
FEV6-%Pred-Post: 79 %
FEV6-%Pred-Pre: 68 %
FEV6-Post: 2.26 L
FEV6-Pre: 1.95 L
FEV6FVC-%Change-Post: 0 %
FEV6FVC-%Pred-Post: 97 %
FEV6FVC-%Pred-Pre: 98 %
FVC-%Change-Post: 13 %
FVC-%Pred-Post: 81 %
FVC-%Pred-Pre: 72 %
FVC-Post: 2.37 L
FVC-Pre: 2.1 L
Post FEV1/FVC ratio: 42 %
Post FEV6/FVC ratio: 95 %
Pre FEV1/FVC ratio: 45 %
Pre FEV6/FVC Ratio: 96 %
RV % pred: 236 %
RV: 3.73 L
TLC % pred: 138 %
TLC: 5.98 L

## 2024-11-18 NOTE — Assessment & Plan Note (Addendum)
 As of 11/18/2024  rx = 2lpm  prn    >>>   Pt using purely prn but advised to monitor sats with goal > 90% esp with exertion.
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
## 2024-11-18 NOTE — Patient Instructions (Signed)
 Full pft performed today

## 2024-11-18 NOTE — Progress Notes (Signed)
 Full pft performed today

## 2024-11-18 NOTE — Assessment & Plan Note (Addendum)
 4  min discussion re active cigarette smoking in addition to office E&M  Ask about tobacco use:   ongoing Advise quitting   life or breath discussion with consideration of what GOLD 3 means to an active smoker  Assess willingness:  Not committed at this point Assist in quit attempt:  Per PCP when ready Arrange follow up:   Follow up per Primary Care planned          Each maintenance medication was reviewed in detail including emphasizing most importantly the difference between maintenance and prns and under what circumstances the prns are to be triggered using an action plan format where appropriate.  Total time for H and P, chart review, counseling, reviewing hfa/neb/ 02/ pulse ox  device(s) and generating customized AVS unique to this office visit / same day charting = 22 min

## 2024-11-18 NOTE — Patient Instructions (Addendum)
 No change in medications   The key is to stop smoking completely before smoking completely stops you!    Make sure you check your oxygen  saturation  AT  your highest level of activity (not after you stop)   to be sure it stays over 90% and adjust  02 flow upward to maintain this level if needed but remember to turn it back to previous settings when you stop (to conserve your supply).  Please schedule a follow up visit in 3 months but call sooner if needed   The nebulizer we ordered for you before was denied by your insurance for now. They will not approve a new machine until 05/29/25.  You can purchase a neb machine and supplies on [employer]- search for nebulizer machine and supplies.  Add Needs alpha one phenotype and hc03 on return

## 2024-11-18 NOTE — Assessment & Plan Note (Addendum)
"   Active smoker -  Spirometry 09/18/2018  FEV1 1.1 (44%)  Ratio 49 with classic curvature p am advair/spiriva  dpi's - 09/18/2018  After extensive coaching inhaler device,  effectiveness =    75% with smi so try change to stiolto with pred x 6 d as plan D  > not doing as of 10/19/2019  - 05/24/2023  After extensive coaching inhaler device,  effectiveness =    75% with hfa > continue breztri  /flutter valve and approp saba  - 01/13/2024  After extensive coaching inhaler device,  effectiveness =     75%  with hfa   and smi> changed to symb 160 / spiriva  2.5 due to insurance not covering breztri   - 04/23/2024 prefers using ipatropium neb so changed to symbicort  160 and neb ipatropium qid with no change in alb using new ABC plan - 06/25/24 LDSCT Marked centrilobular emphysema with diffuse bronchial wall thickening - PFT's  11/18/2024   FEV1 0.99 (43 % ) ratio 0.42  p 5 % improvement from saba p breztri  prior to study with DLCO  12.13 (69%)   and FV curve classically concave     Group E in terms of symptoms/risk so  laba/lama/ICS  therefore appropriate rx at this point >>>  continue breztri    and approp SABA prn.  Re SABA :  I spent extra time with pt today reviewing appropriate use of albuterol  for prn use on exertion with the following points: 1) saba is for relief of sob that does not improve by walking a slower pace or resting but rather if the pt does not improve after trying this first. 2) If the pt is convinced, as many are, that saba helps recover from activity faster then it's easy to tell if this is the case by re-challenging : ie stop, take the inhaler, then p 5 minutes try the exact same activity (intensity of workload) that just caused the symptoms and see if they are substantially diminished or not after saba 3) if there is an activity that reproducibly causes the symptoms, try the saba 15 min before the activity on alternate days   If in fact the saba really does help, then fine to continue to use it  prn but advised may need to look closer at the maintenance regimen being used to achieve better control of airways disease with exertion.     "

## 2024-11-19 ENCOUNTER — Telehealth: Payer: Self-pay | Admitting: *Deleted

## 2024-11-19 NOTE — Telephone Encounter (Signed)
 Mammogram appointment January 30.2026 at 1:20 pm to arrive 1:00 pm / breast center- [phone]. Appointment mailed to the patient with the information regarding the 75.00 no show fee.

## 2024-11-23 ENCOUNTER — Ambulatory Visit: Payer: Self-pay | Admitting: Internal Medicine

## 2024-11-24 ENCOUNTER — Encounter: Payer: Self-pay | Admitting: Student

## 2024-12-01 ENCOUNTER — Telehealth: Payer: Self-pay

## 2024-12-01 NOTE — Telephone Encounter (Signed)
 Copied from CRM #[id number]. Topic: Clinical - Prescription Issue >> Dec 01, 2024  2:44 PM Russell PARAS wrote: Reason for CRM:   Pt is contacting clinic to request refills on albuterol  (VENTOLIN  HFA) 108 (90 Base) MCG/ACT inhaler. After reviewing chart, advised this medication is prescribed by her PCP. Pt reports she spoke with Wert about taking over the prescription at her last OV on 1/7 and he was agreeable.  Requested call back to get clarification  CB#  [phone]  Please clarify on the albuterol  as well.

## 2024-12-01 NOTE — Telephone Encounter (Signed)
 Copied from CRM #[id number]. Topic: Clinical - Medication Refill >> Dec 01, 2024  2:46 PM Russell PARAS wrote: Medication:   Pt is requesting refill on prednisolone due to winter weather, as a preventative  Has the patient contacted their pharmacy? Yes (Agent: If no, request that the patient contact the pharmacy for the refill. If patient does not wish to contact the pharmacy document the reason why and proceed with request.) (Agent: If yes, when and what did the pharmacy advise?)  This is the patient's preferred pharmacy:   [id number] - [place], [place] - [address] AT [facility] OF [address] & [address] [place] [address] Phone: [phone] Fax: [phone]  Is this the correct pharmacy for this prescription? Yes If no, delete pharmacy and type the correct one.   Has the prescription been filled recently? Yes  Is the patient out of the medication? Yes  Has the patient been seen for an appointment in the last year OR does the patient have an upcoming appointment? Yes, 1/7 w/Wert  Can we respond through MyChart? Yes  Agent: Please be advised that Rx refills may take up to 3 business days. We ask that you follow-up with your pharmacy.   Dr Darlean, I do not see prednisone  in her medication list. Is this appropriate?

## 2024-12-03 ENCOUNTER — Ambulatory Visit: Payer: Self-pay | Admitting: Internal Medicine

## 2024-12-03 ENCOUNTER — Other Ambulatory Visit: Payer: Self-pay

## 2024-12-03 MED ORDER — AZITHROMYCIN 250 MG PO TABS: 250.0000 mg | ORAL_TABLET | Freq: Every day | ORAL | 0 refills | Status: AC

## 2024-12-03 MED ORDER — PREDNISONE 10 MG PO TABS: 10.0000 mg | ORAL_TABLET | Freq: Every day | ORAL | 0 refills | Status: AC

## 2024-12-03 NOTE — Telephone Encounter (Signed)
 Reason for Triage: rattling in her chest stated been around someone with the flu and wants antibiotic or prednisone  Reason for Disposition  [1] Longstanding difficulty breathing (e.g., CHF, COPD, emphysema) AND [2] WORSE than normal  Answer Assessment - Initial Assessment Questions Multiple messages about albuterol  and prednisone . Pt states she has been around her sisters, one was diagnosed with the flu, the other diagnosed with bronchitis. She states she doesn't want to take any chances especially with the weather and would like an atbx and prednisone  called in. She states he typically does this for her. She states she has had worsening symptoms for the last 2-3 days including rattling in chest, wheezing, chest tightness, increased used of oxygen . She states some of it is at rest at well. She states her nebulizer isn't working right right now and she is ordering a new one off [employer]. She checked pulse ox while on phone with RN 96%. Rn did advise if no answer from office and continues to progress, go to ER for evaluation. Pt stated understanding but states, typically when she gets like this Dr. Darlean will just send her in medications. RN stated understanding but again advised her to go to ER with any changes.     1. RESPIRATORY STATUS: Describe your breathing? (e.g., wheezing, shortness of breath, unable to speak, severe coughing)      Increasing shortness of breath 2. ONSET: When did this breathing problem begin?      2-3 days 3. PATTERN Does the difficult breathing come and go, or has it been constant since it started?      constant 4. SEVERITY: How bad is your breathing? (e.g., mild, moderate, severe)     Moderate per description  5. RECURRENT SYMPTOM: Have you had difficulty breathing before? If Yes, ask: When was the last time? and What happened that time?      Yes, dr wert prescribes prednisone  and zpack 6. CARDIAC HISTORY: Do you have any history of heart disease? (e.g.,  heart attack, angina, bypass surgery, angioplasty)      no 7. LUNG HISTORY: Do you have any history of lung disease?  (e.g., pulmonary embolus, asthma, emphysema)     COPD 8. CAUSE: What do you think is causing the breathing problem?      Weather, illnesses 9. OTHER SYMPTOMS: Do you have any other symptoms? (e.g., chest pain, cough, dizziness, fever, runny nose)     denies 10. O2 SATURATION MONITOR:  Do you use an oxygen  saturation monitor (pulse oximeter) at home? If Yes, ask: What is your reading (oxygen  level) today? What is your usual oxygen  saturation reading? (e.g., 95%)       96%  Protocols used: Breathing Difficulty-A-AH

## 2024-12-03 NOTE — Telephone Encounter (Signed)
 rattling in her chest stated been around someone with the flu and wants antibiotic or prednisone    Pls advise

## 2024-12-03 NOTE — Telephone Encounter (Signed)
 FYI Only or Action Required?: Action required by provider: medication refill request.  Patient is followed in Pulmonology for COPD, last seen on 11/18/2024 by Darlean Ozell NOVAK, MD.  Called Nurse Triage reporting Shortness of Breath.  Symptoms began several days ago.  Interventions attempted: Home oxygen  use.  Symptoms are: gradually worsening.  Triage Disposition: See HCP Within 4 Hours (Or PCP Triage)  Patient/caregiver understands and will follow disposition?: Yes

## 2024-12-03 NOTE — Telephone Encounter (Signed)
 Is the okay to refill for both he albuterol  and Prednisone ?

## 2024-12-11 ENCOUNTER — Ambulatory Visit

## 2024-12-14 ENCOUNTER — Other Ambulatory Visit (HOSPITAL_BASED_OUTPATIENT_CLINIC_OR_DEPARTMENT_OTHER): Payer: Self-pay

## 2024-12-14 ENCOUNTER — Telehealth (HOSPITAL_BASED_OUTPATIENT_CLINIC_OR_DEPARTMENT_OTHER): Payer: Self-pay

## 2024-12-14 DIAGNOSIS — J449 Chronic obstructive pulmonary disease, unspecified: Secondary | ICD-10-CM

## 2024-12-14 DIAGNOSIS — J441 Chronic obstructive pulmonary disease with (acute) exacerbation: Secondary | ICD-10-CM

## 2024-12-14 MED ORDER — BREZTRI AEROSPHERE 160-9-4.8 MCG/ACT IN AERO: 2.0000 | INHALATION_SPRAY | Freq: Two times a day (BID) | RESPIRATORY_TRACT | 0 refills | Status: AC

## 2024-12-14 MED ORDER — ALBUTEROL SULFATE HFA 108 (90 BASE) MCG/ACT IN AERS: 2.0000 | INHALATION_SPRAY | Freq: Four times a day (QID) | RESPIRATORY_TRACT | 1 refills | Status: AC | PRN

## 2024-12-14 MED ORDER — ALBUTEROL SULFATE (2.5 MG/3ML) 0.083% IN NEBU: 2.5000 mg | INHALATION_SOLUTION | RESPIRATORY_TRACT | 11 refills | Status: AC | PRN

## 2024-12-14 NOTE — Telephone Encounter (Signed)
 Rxs sent to pharmacy      Copied from CRM [id number]. Topic: Clinical - Medication Refill >> Dec 14, 2024 12:54 PM Rozanna G wrote: Medication: albuterol  (VENTOLIN  HFA) 108 (90 Base) MCG/ACT inhaler albuterol  (PROVENTIL ) (2.5 MG/3ML) 0.083% nebulizer solution budesonide -glycopyrrolate-formoterol  (BREZTRI  AEROSPHERE) 160-9-4.8 MCG/ACT AERO inhaler  Has the patient contacted their pharmacy? Yes (Agent: If no, request that the patient contact the pharmacy for the refill. If patient does not wish to contact the pharmacy document the reason why and proceed with request.) (Agent: If yes, when and what did the pharmacy advise?)  This is the patient's preferred pharmacy:  [facility] [id number] - [place], [place] - [address] AT [facility] [address] [place] [address] Phone: [phone] Fax: [phone]   Is this the correct pharmacy for this prescription? Yes If no, delete pharmacy and type the correct one.   Has the prescription been filled recently? Yes  Is the patient out of the medication? Yes  Has the patient been seen for an appointment in the last year OR does the patient have an upcoming appointment? Yes  Can we respond through MyChart? No  Agent: Please be advised that Rx refills may take up to 3 business days. We ask that you follow-up with your pharmacy.

## 2024-12-17 ENCOUNTER — Telehealth: Payer: Self-pay | Admitting: *Deleted

## 2024-12-17 NOTE — Telephone Encounter (Signed)
 Mammogram appointment for 12-11-2024- canceled. See message from breast center concerning insurance.
